# Patient Record
Sex: Female | Born: 1935 | Race: White | Hispanic: No | Smoking: Never smoker
Health system: Southern US, Community
[De-identification: ages and names within clinical notes are randomized; demographics above are authoritative.]

## PROBLEM LIST (undated history)

## (undated) DIAGNOSIS — F419 Anxiety disorder, unspecified: Secondary | ICD-10-CM

## (undated) DIAGNOSIS — K649 Unspecified hemorrhoids: Secondary | ICD-10-CM

## (undated) DIAGNOSIS — E7800 Pure hypercholesterolemia, unspecified: Secondary | ICD-10-CM

## (undated) DIAGNOSIS — K219 Gastro-esophageal reflux disease without esophagitis: Secondary | ICD-10-CM

## (undated) DIAGNOSIS — H409 Unspecified glaucoma: Secondary | ICD-10-CM

## (undated) DIAGNOSIS — G629 Polyneuropathy, unspecified: Secondary | ICD-10-CM

## (undated) DIAGNOSIS — F329 Major depressive disorder, single episode, unspecified: Secondary | ICD-10-CM

## (undated) DIAGNOSIS — R0602 Shortness of breath: Secondary | ICD-10-CM

## (undated) DIAGNOSIS — F0390 Unspecified dementia without behavioral disturbance: Secondary | ICD-10-CM

## (undated) DIAGNOSIS — R627 Adult failure to thrive: Secondary | ICD-10-CM

## (undated) DIAGNOSIS — E785 Hyperlipidemia, unspecified: Secondary | ICD-10-CM

## (undated) DIAGNOSIS — K5900 Constipation, unspecified: Secondary | ICD-10-CM

## (undated) DIAGNOSIS — R630 Anorexia: Secondary | ICD-10-CM

## (undated) DIAGNOSIS — G894 Chronic pain syndrome: Secondary | ICD-10-CM

## (undated) HISTORY — PX: EYE SURGERY: SHX253

## (undated) HISTORY — PX: TONSILLECTOMY: SUR1361

---

## 2000-08-09 ENCOUNTER — Ambulatory Visit (HOSPITAL_COMMUNITY): Admission: RE | Admit: 2000-08-09 | Discharge: 2000-08-09 | Payer: Self-pay | Admitting: Family Medicine

## 2000-08-09 ENCOUNTER — Encounter: Payer: Self-pay | Admitting: Family Medicine

## 2001-01-04 ENCOUNTER — Other Ambulatory Visit: Admission: RE | Admit: 2001-01-04 | Discharge: 2001-01-04 | Payer: Self-pay | Admitting: Family Medicine

## 2001-02-13 ENCOUNTER — Ambulatory Visit (HOSPITAL_COMMUNITY): Admission: RE | Admit: 2001-02-13 | Discharge: 2001-02-13 | Payer: Self-pay | Admitting: Family Medicine

## 2001-02-13 ENCOUNTER — Encounter: Payer: Self-pay | Admitting: Family Medicine

## 2002-02-17 ENCOUNTER — Ambulatory Visit (HOSPITAL_COMMUNITY): Admission: RE | Admit: 2002-02-17 | Discharge: 2002-02-17 | Payer: Self-pay | Admitting: Family Medicine

## 2002-02-17 ENCOUNTER — Encounter: Payer: Self-pay | Admitting: Family Medicine

## 2003-04-17 ENCOUNTER — Inpatient Hospital Stay (HOSPITAL_COMMUNITY): Admission: EM | Admit: 2003-04-17 | Discharge: 2003-04-19 | Payer: Self-pay | Admitting: Emergency Medicine

## 2003-07-26 ENCOUNTER — Emergency Department (HOSPITAL_COMMUNITY): Admission: EM | Admit: 2003-07-26 | Discharge: 2003-07-26 | Payer: Self-pay | Admitting: Emergency Medicine

## 2003-08-12 ENCOUNTER — Ambulatory Visit (HOSPITAL_COMMUNITY): Admission: RE | Admit: 2003-08-12 | Discharge: 2003-08-12 | Payer: Self-pay | Admitting: Family Medicine

## 2003-10-07 ENCOUNTER — Ambulatory Visit (HOSPITAL_COMMUNITY): Admission: RE | Admit: 2003-10-07 | Discharge: 2003-10-07 | Payer: Self-pay | Admitting: Family Medicine

## 2004-05-24 ENCOUNTER — Ambulatory Visit (HOSPITAL_COMMUNITY): Admission: RE | Admit: 2004-05-24 | Discharge: 2004-05-24 | Payer: Self-pay | Admitting: Family Medicine

## 2004-06-29 ENCOUNTER — Emergency Department (HOSPITAL_COMMUNITY): Admission: EM | Admit: 2004-06-29 | Discharge: 2004-06-29 | Payer: Self-pay | Admitting: Emergency Medicine

## 2005-04-16 ENCOUNTER — Emergency Department (HOSPITAL_COMMUNITY): Admission: EM | Admit: 2005-04-16 | Discharge: 2005-04-16 | Payer: Self-pay | Admitting: Emergency Medicine

## 2005-04-21 ENCOUNTER — Ambulatory Visit (HOSPITAL_COMMUNITY): Admission: RE | Admit: 2005-04-21 | Discharge: 2005-04-21 | Payer: Self-pay | Admitting: Family Medicine

## 2005-05-02 ENCOUNTER — Ambulatory Visit (HOSPITAL_COMMUNITY): Admission: RE | Admit: 2005-05-02 | Discharge: 2005-05-02 | Payer: Self-pay | Admitting: Family Medicine

## 2005-06-22 ENCOUNTER — Emergency Department (HOSPITAL_COMMUNITY): Admission: EM | Admit: 2005-06-22 | Discharge: 2005-06-22 | Payer: Self-pay | Admitting: Emergency Medicine

## 2005-06-24 ENCOUNTER — Inpatient Hospital Stay (HOSPITAL_COMMUNITY): Admission: EM | Admit: 2005-06-24 | Discharge: 2005-06-27 | Payer: Self-pay | Admitting: Emergency Medicine

## 2005-06-24 ENCOUNTER — Ambulatory Visit: Payer: Self-pay | Admitting: Internal Medicine

## 2006-03-22 ENCOUNTER — Emergency Department (HOSPITAL_COMMUNITY): Admission: EM | Admit: 2006-03-22 | Discharge: 2006-03-22 | Payer: Self-pay | Admitting: Emergency Medicine

## 2008-05-11 ENCOUNTER — Ambulatory Visit (HOSPITAL_COMMUNITY): Payer: Self-pay | Admitting: Family Medicine

## 2008-05-11 ENCOUNTER — Encounter (HOSPITAL_COMMUNITY): Admission: RE | Admit: 2008-05-11 | Discharge: 2008-06-10 | Payer: Self-pay | Admitting: Oncology

## 2010-05-08 ENCOUNTER — Emergency Department (HOSPITAL_COMMUNITY)
Admission: EM | Admit: 2010-05-08 | Discharge: 2010-05-08 | Payer: Self-pay | Source: Home / Self Care | Admitting: Emergency Medicine

## 2010-05-22 ENCOUNTER — Encounter: Payer: Self-pay | Admitting: Family Medicine

## 2010-09-16 NOTE — Group Therapy Note (Signed)
NAME:  Alexandra Riley, Alexandra Riley                         ACCOUNT NO.:  [id number]   MEDICAL RECORD NO.:  [id number]                   PATIENT TYPE:  INP   LOCATION:  A320                                 FACILITY:  [facility]   PHYSICIAN:  Mila Homer. Sudie Bailey, M.D.           DATE OF BIRTH:  23-Mar-1922   DATE OF PROCEDURE:  04/18/2003  DATE OF DISCHARGE:                                   PROGRESS NOTE   SUBJECTIVE:  She is feeling somewhat better.  She tells me that for 3-4  months she has had intermittent dizziness which gives a sensation of  movement.  It is noted more when she is standing up and moving around but  also noted when she is sitting and turning.  She developed sudden onset of  vomiting two nights ago.  She had no diarrhea with this, but after coming to  the hospital she developed chills.  There were no sweats.  She is now  feeling better and is actually taking a soft mechanical diet.   OBJECTIVE:  She is sitting in bed in no acute distress, well-developed well-  nourished.  She appears to be oriented and alert.  Temperature is 97.1,  pulse 57, respiratory rate 20, blood pressure 139/74.  The lungs are clear  throughout.  The heart has a regular rhythm.  The abdomen today is soft  without hepatosplenomegaly or mass. There is no tenderness in the abdomen.  There is no edema of the ankles.  O2 saturation on room air is 96%.  Skin  color is good.   LABORATORY DATA:  MET-7 showed a potassium of 3.3.   ASSESSMENT:  1. Probably a gastritis.  2. The dizzy spells may be secondary to strokes (see this admission's brain     CT).   PLAN:  Continue with the Plavix 75 mg daily and p.r.n. acetaminophen,  alprazolam, meclizine, on __________, and promethazine.  She is currently on  KCl IV though we will switch it to p.o. and HepLock at this point.      ___________________________________________                                            Mila Homer. Sudie Bailey, M.D.   SDK/MEDQ  D:  04/18/2003   T:  04/18/2003  Job:  [id number]

## 2010-09-16 NOTE — Discharge Summary (Signed)
NAMEGIGI, Alexandra Riley               ACCOUNT NO.:  [id number]   MEDICAL RECORD NO.:  [id number]          PATIENT TYPE:  INP   LOCATION:  3018                         FACILITY:  [facility]   PHYSICIAN:  C. Ulyess Mort, M.D.DATE OF BIRTH:  11-05-1921   DATE OF ADMISSION:  06/24/2005  DATE OF DISCHARGE:  06/27/2005                                 DISCHARGE SUMMARY   DISCHARGE DIAGNOSES:  1.  T4 and T7 vertebral wedge fracture.  2.  Partial small bowel obstruction secondary to obstipation.  3.  Dyslipidemia.  4.  Anxiety.  5.  History of paroxysmal atrial fibrillation.  6.  History of cesarean section.  7.  History of probable cerebrovascular accident.  8.  Cystitis.   DISCHARGE MEDICATIONS:  1.  Percocet one to two tablets p.o. q.4h. p.r.n.  2.  Aspirin 81 mg p.o. daily.  3.  Zetia 10 mg p.o. daily.  4.  Fenofibrate 130 mg p.o. daily.  5.  Clonazepam 0.5 mg p.o. t.i.d.  6.  Senna two tablets p.o. daily.  7.  Cipro one tablet p.o. b.i.d.   CONDITION ON DISCHARGE:  Alexandra Riley was admitted with complaints of  abdominal and back pain.  Following diagnostic imaging Alexandra Riley was  found to have a T4 and T7 vertebral wedge fracture which most likely  explained her back and flank pain.  An acute abdominal series revealed air  fluid levels in her abdomen indicating a partial small bowel obstruction.  This was relieved with an enema and laxatives.  This was likely secondary to  narcotic use to treat her back pain resulting from her wedge fracture.  She  is to follow up with her primary care physician, Dr. Butch Penny on March  6 at 9:15 a.m.   PROCEDURES:  June 26, 2005:  Abdominal CT without contrast/pelvic CT  without contrast.  Impression:  New bibasilar atelectasis.  No acute intra-  abdominal abnormality.  No acute abnormality in the pelvis.   CHIEF COMPLAINT:  Back pain.   HISTORY OF PRESENT ILLNESS:  Alexandra Riley is an 77 year old female with past  medical history of  osteoporosis who presents with an approximately four-day  history of abdominal pain.  Symptoms began four days prior to admission at  which time she was sent to [facility] Emergency Department and was  discharged with a prescription for Vicodin, back exercises.  Pain was  unrelieved and constant so she sought evaluation at [facility].  Pain was  worse with movement, unrelieved with Vicodin.  She admits to a previous fall  recently where she struck the left flank.  She initially notes recurrent  cystitis; however, denies these symptoms recently and additionally notes  recent nausea over the past four days.   ALLERGIES:  No known drug allergies.   PAST MEDICAL HISTORY:  1.  Paroxysmal atrial fibrillation.  2.  Dyslipidemia.  3.  History of a cesarean section.  4.  Probable CVA in 2004.   HOME MEDICATIONS:  1.  Oxycodone/acetaminophen 7.5/325 p.o. q.4h. p.r.n.  2.  Phenergan 50 mg p.o. p.r.n.  3.  Aspirin  81 mg p.o. daily.  4.  Zetia 10 mg p.o. daily.  5.  Fenofibrate 130 mg p.o. daily.  6.  Meclizine p.o. p.r.n.  7.  Clonazepam 0.5 mg p.o. t.i.d.  8.  Soma 350 mg p.o. daily.   REVIEW OF SYSTEMS:  Negative except as per HPI.   PHYSICAL EXAMINATION:  VITAL SIGNS:  Temperature 97.4, blood pressure  112/66, heart rate 69, respirations 20, oxygen saturation 99% on room air.  GENERAL:  No acute distress.  Thin.  HEENT:  Pupils are equal, round, and reactive to light.  Extraocular  movements intact.  Mucous membranes pink and moist.  No tonsillar  lymphadenopathy.  Poor dentition.  Supple neck.  PULMONARY:  Breath sounds clear to auscultation bilaterally.  Equal  excursion bilaterally.  CV:  S1, S2 without murmurs, thrills, rubs, or gallops.  ABDOMEN:  Soft, nontender, nondistended.  Decreased bowel sounds x4.  EXTREMITIES:  No clubbing, cyanosis, edema.  SKIN:  Friable.  MUSCULOSKELETAL:  Patient has kyphosis and tenderness over the right mid  thoracic ribs and left  flank.  NEUROLOGIC:  Alert and oriented x3.  No gross or focal deficits.  PSYCH:  Euthymic with concurrent affect.   LABORATORIES:  White blood cell count 10, hemoglobin 13.1, hematocrit 38.7,  platelets 288.  Sodium 139, potassium 4.5, chloride 108, CO2 25, glucose  110, BUN 8, creatinine 1, total bilirubin 0.6, alkaline phosphatase 65, AST  21, ALT 14, total protein 6.7, albumin 3.8, calcium 9.5, lipase 27.  CK-MB  1.5, troponin less than 0.05, myoglobin 121.  UA within normal limits except  for small leukocyte esterase, 3-6 white blood cells.   HOSPITAL COURSE:  #1 - VERTEBRAL WEDGE FRACTURES:  Alexandra Riley was found to  have a T4 and a T7 wedge fracture on review of CT scan taken at previous  evaluation at [facility] on the 22nd of February.  She was treated with  calcium plus vitamin D during hospital stay and Percocet was given for back  pain.  Patient's osteoporosis is severe and will be managed by the primary  care physician.  She potentially would benefit from continued use of Os-Cal  and potentially bisphosphonates.   #2 - CONSTIPATION:  Alexandra Riley was found on upright abdominal films to  have multiple air fluid levels providing evidence for a partial small bowel  obstruction.  She received enemas and laxatives during her hospital stay  which were very effective and she is to be discharged on Senna following  discharge.   #3 - URINARY TRACT INFECTION:  Alexandra Riley was found to have a Proteus  infection; however, the colony count was 25,000 colonies per milliliter.  This was questionable significance, however, with her abdominal pain.  This  was treated with three-day course of p.o. Cipro.   #4 - DYSLIPIDEMIA:  Alexandra Riley's home medications of Zetia and fenofibrate  were continued through her hospital stay.   #5 - ANXIETY:  Alexandra Riley's home medication of clonazepam was continued  through her hospital stay.  DISCHARGE LABORATORIES AND VITALS:  Temperature 97.9,  heart rate 72,  respirations 20, blood pressure 97/46, oxygen saturation 94% on room air.  Sodium 133, potassium 3.7, chloride 102, bicarbonate 24, glucose 118, BUN  12, creatinine 1, calcium 9.6, magnesium 2.3.  Free T4 1.08.      Sharin Mons, M.D.    ______________________________  C. Ulyess Mort, M.D.    WC/MEDQ  D:  07/03/2005  T:  07/03/2005  Job:  [id number]  cc:   Angus G. Renard Matter, MD  Fax: [phone]

## 2010-09-16 NOTE — Procedures (Signed)
NAME:  Alexandra Riley, Alexandra Riley                         ACCOUNT NO.:  [id number]   MEDICAL RECORD NO.:  [id number]                   PATIENT TYPE:  INP   LOCATION:  A320                                 FACILITY:  [facility]   PHYSICIAN:  Richard A. Alanda Amass, M.D.          DATE OF BIRTH:  1921/07/17   DATE OF PROCEDURE:  04/17/2003  DATE OF DISCHARGE:                                  ECHOCARDIOGRAM   TECHNICAL QUALITY:  Adequate.   INDICATIONS FOR PROCEDURE:  Apparently this 77 year old woman has had a  history of CVA with no prior cardiac history.   FINDINGS:  The aorta is normal at 2.8 cm.   The aortic valve has 3 leaflets with good opening. There is mild thickening  and mild aortic sclerosis. There is no significant aortic stenosis present.  No significant AI present.   The left atrium is normal at 3.5 cm. The patient was in sinus rhythm during  this study. There were no clots seen.   The mitral valve opens normally with mild to moderate mitral annular  calcification. There is trivial MR present and no mitral stenosis.   IVS and LVPW are at the upper limits of normal at 1.1 cm each with normal  contraction pattern.   The left ventricular internal dimensions are relatively small but within  normal limits. LVIDD equal at 3.5, LVISD equal at 2.2 cm. There is normal  thickening of all visualized segments with estimated ejection fraction  greater than 55%. LV inflow signal shows mild E to A reversal, that may be  related to age or may be related to mild diastolic relaxation abnormality.   Pulmonary inflow signal is normal.   Right ventricle is normal. IVC is normal. There is no pericardial effusion.   There is mild tricuspid regurgitation present.   2-D ECHOCARDIOGRAM:  Demonstrates normal left atrial size, normal systolic  LV function, aortic sclerosis without stenosis and mild mitral annular  calcification. There is no source of embolus seen. If further information is  necessary, TEE  may helpful.      ___________________________________________                                            Pearletha Furl. Alanda Amass, M.D.   RAW/MEDQ  D:  04/17/2003  T:  04/18/2003  Job:  [id number]   cc:   Angus G. Renard Matter, M.D.  [address]  [place]  [place] [address]  Fax: [phone]

## 2010-09-16 NOTE — Discharge Summary (Signed)
NAME:  Alexandra Riley, Alexandra Riley                         ACCOUNT NO.:  [id number]   MEDICAL RECORD NO.:  [id number]                  PATIENT TYPE:   LOCATION:                                       FACILITY:  [facility]   PHYSICIAN:  Mila Homer. Sudie Bailey, M.D.           DATE OF BIRTH:  1921/08/09   DATE OF ADMISSION:  04/17/2003  DATE OF DISCHARGE:  04/19/2003                                 DISCHARGE SUMMARY   This 77 year old was admitted to the hospital April 17, 2003.  She had a  benign 3-day hospitalization extending from December 17 to April 19, 2003.  Vital signs remained stable.   Her white cell count was 6900 with a normal differential.  Her potassium is  3.0, glucose is 139, and potassium went up to 3.3 with potassium  supplementation.  Her liver function tests were normal. CK/MB was 4.1 and  troponin less than 0.01.  Her EKG was essentially normal.  Her CT of the  head without contrast showed a possible early subacute stroke of the left  pons.   She was admitted to the hospital, put on IV normal saline at 100 cc an hour,  Phenergan 12.5 mg IV q.4h. p.r.n., clear liquid diet, and vital signs  q.i.d., and Plavix 75 mg daily with Xanax 0.5 q.4h. p.r.n. on 3 liters of  O2.  Carotid Dopplers were ordered.  She was put on Tylenol 650 q.4h. and  then KCl 20 mEq to her IV.  By her second day her IV was Hep-Locked and she  was put on KCl 20 mEq b.i.d.Marland Kitchen   By the third day she was much better.  She was up and around.  She is eating  well.  It was felt that she was stable for discharge home.  In the hospital  she gave a history of having chills after coming in with the vomiting, so it  my feeling that the most likely diagnosis was viral gastroenteritis.  There  was a questionable area on the CT of the brain for stroke.  As I discussed  with her she could certainly have had a silent stroke in the past, or  actually she could have had a stroke 4 months ago when she started having  dizzy  spells.   FINAL DISCHARGE DIAGNOSES:  1. Presumptive viral gastroenteritis.  2. Hypokalemia.  3. Possible cerebrovascular accident.   DISCHARGE INSTRUCTIONS:  The patient discharged home today to follow up with  Dr. Renard Matter this week.   DISCHARGE MEDICATIONS:  1. She is given a prescription for Plavix 75 mg daily (10 with 2 refills).  2. Meclizine 25 mg 1/2 to 1 tablet q.i.d. (30 with 2 refills).  3. Continue clonazepam 0.5 t.i.d.     ___________________________________________  Mila Homer. Sudie Bailey, M.D.   SDK/MEDQ  D:  04/19/2003  T:  04/19/2003  Job:  [id number]   cc:   Angus G. Renard Matter, M.D.  [address]  [place]  [place] [address]  Fax: [phone]

## 2010-09-16 NOTE — H&P (Signed)
NAME:  Alexandra Riley, Alexandra Riley                         ACCOUNT NO.:  [id number]   MEDICAL RECORD NO.:  [id number]                   PATIENT TYPE:  INP   LOCATION:  A320                                 FACILITY:  [facility]   PHYSICIAN:  Angus G. Renard Matter, M.D.              DATE OF BIRTH:  [birth year]   DATE OF ADMISSION:  04/16/2003  DATE OF DISCHARGE:                                HISTORY & PHYSICAL   HISTORY OF PRESENT ILLNESS:  A 77 year old white female presented herself to  the ED with bouts of vomiting and dizziness.  She was evaluated by ED  physician.  CT of the head showed possible subacute lacunar stroke in the  left pons, no hemorrhage.  The patient was given IV Zofran in ED.  Intravenous fluids were started.  The patient's lab studies on admission  showed CBC:  WBC 6900, hemoglobin 13.3, hematocrit 39.8.  Chemistry:  Sodium  142, potassium 3, chloride 110, CO2 25, glucose 139, BUN 12, creatinine 0.8,  calcium 8.9.  Cardiac enzymes:  CPK 143, CPK-MB 4.1, troponin less than  0.01.  The patient was subsequently admitted.   SOCIAL HISTORY:  The patient does not smoke or drink alcohol, lives alone.   FAMILY HISTORY:  See previous record.   PAST MEDICAL AND SURGICAL HISTORY:  The patient has a history of  hyperlipidemia, previous history of C-section.   MEDICATION LIST AT HOME:  1. Clonazepam 0.5 mg t.i.d.  2. Carisoprodol 350 mg four times a day.   ALLERGIES:   REVIEW OF SYSTEMS:  HEENT:  Episodes of dizziness.  GI:  No bowel  irregularity but episodes of vomiting.  CARDIOPULMONARY:  No cough,  hemoptysis, or dyspnea.  GU:  No dysuria or hematuria.   PHYSICAL EXAMINATION:  GENERAL:  Alert female, uncomfortable.  VITAL SIGNS:  Blood pressure 139/60, respirations 18, pulse 62, temperature  98.  HEENT:  Eyes PERRLA, TM negative, oropharynx benign.  NECK:  Supple.  No JVD or thyroid abnormalities.  BREASTS:  Normal, no masses.  HEART:  Regular rhythm.  ABDOMEN:  No palpable organs or  masses.  Decreased bowel sounds.  SKIN:  Warm and dry.  NEUROLOGIC:  No focal deficit.   ASSESSMENT:  The patient was admitted with cerebrovascular accident,  dyslipidemia.     ___________________________________________                                         Ishmael Holter. Renard Matter, M.D.   AGM/MEDQ  D:  04/17/2003  T:  04/17/2003  Job:  [id number]

## 2010-09-20 ENCOUNTER — Other Ambulatory Visit (HOSPITAL_COMMUNITY): Payer: Self-pay | Admitting: Family Medicine

## 2010-09-24 ENCOUNTER — Emergency Department (HOSPITAL_COMMUNITY): Payer: Medicare Other

## 2010-09-24 ENCOUNTER — Emergency Department (HOSPITAL_COMMUNITY)
Admission: EM | Admit: 2010-09-24 | Discharge: 2010-09-24 | Disposition: A | Discharge status: Home / Self Care | Payer: Medicare Other | Attending: Emergency Medicine | Admitting: Emergency Medicine

## 2010-09-24 DIAGNOSIS — I4891 Unspecified atrial fibrillation: Secondary | ICD-10-CM | POA: Insufficient documentation

## 2010-09-24 DIAGNOSIS — M129 Arthropathy, unspecified: Secondary | ICD-10-CM | POA: Insufficient documentation

## 2010-09-24 DIAGNOSIS — Z7982 Long term (current) use of aspirin: Secondary | ICD-10-CM | POA: Insufficient documentation

## 2010-09-24 DIAGNOSIS — M79609 Pain in unspecified limb: Secondary | ICD-10-CM | POA: Insufficient documentation

## 2010-09-24 DIAGNOSIS — G8929 Other chronic pain: Secondary | ICD-10-CM | POA: Insufficient documentation

## 2010-09-24 DIAGNOSIS — S5010XA Contusion of unspecified forearm, initial encounter: Secondary | ICD-10-CM | POA: Insufficient documentation

## 2010-09-24 DIAGNOSIS — Z79899 Other long term (current) drug therapy: Secondary | ICD-10-CM | POA: Insufficient documentation

## 2010-09-24 DIAGNOSIS — Y92009 Unspecified place in unspecified non-institutional (private) residence as the place of occurrence of the external cause: Secondary | ICD-10-CM | POA: Insufficient documentation

## 2010-09-24 DIAGNOSIS — M542 Cervicalgia: Secondary | ICD-10-CM | POA: Insufficient documentation

## 2010-09-24 DIAGNOSIS — M810 Age-related osteoporosis without current pathological fracture: Secondary | ICD-10-CM | POA: Insufficient documentation

## 2010-09-24 DIAGNOSIS — M7989 Other specified soft tissue disorders: Secondary | ICD-10-CM | POA: Insufficient documentation

## 2010-09-24 DIAGNOSIS — E785 Hyperlipidemia, unspecified: Secondary | ICD-10-CM | POA: Insufficient documentation

## 2010-09-27 ENCOUNTER — Other Ambulatory Visit (HOSPITAL_COMMUNITY): Payer: Self-pay

## 2011-03-12 ENCOUNTER — Encounter: Payer: Self-pay | Admitting: Emergency Medicine

## 2011-03-12 ENCOUNTER — Emergency Department (HOSPITAL_COMMUNITY)
Admission: EM | Admit: 2011-03-12 | Discharge: 2011-03-12 | Disposition: A | Discharge status: Home / Self Care | Payer: Medicare Other | Attending: Emergency Medicine | Admitting: Emergency Medicine

## 2011-03-12 ENCOUNTER — Emergency Department (HOSPITAL_COMMUNITY): Payer: Medicare Other

## 2011-03-12 DIAGNOSIS — R109 Unspecified abdominal pain: Secondary | ICD-10-CM | POA: Insufficient documentation

## 2011-03-12 DIAGNOSIS — N390 Urinary tract infection, site not specified: Secondary | ICD-10-CM | POA: Insufficient documentation

## 2011-03-12 DIAGNOSIS — R062 Wheezing: Secondary | ICD-10-CM | POA: Insufficient documentation

## 2011-03-12 DIAGNOSIS — E7800 Pure hypercholesterolemia, unspecified: Secondary | ICD-10-CM | POA: Insufficient documentation

## 2011-03-12 DIAGNOSIS — Z7982 Long term (current) use of aspirin: Secondary | ICD-10-CM | POA: Insufficient documentation

## 2011-03-12 DIAGNOSIS — M810 Age-related osteoporosis without current pathological fracture: Secondary | ICD-10-CM | POA: Insufficient documentation

## 2011-03-12 HISTORY — DX: Pure hypercholesterolemia, unspecified: E78.00

## 2011-03-12 LAB — COMPREHENSIVE METABOLIC PANEL
ALT: 13 U/L (ref 0–35)
Albumin: 4.3 g/dL (ref 3.5–5.2)
BUN: 14 mg/dL (ref 6–23)
Calcium: 10.1 mg/dL (ref 8.4–10.5)
GFR calc Af Amer: 66 mL/min — ABNORMAL LOW (ref >90)
Glucose, Bld: 92 mg/dL (ref 70–99)
Potassium: 3.9 mEq/L (ref 3.5–5.1)
Sodium: 142 mEq/L (ref 135–145)
Total Protein: 7.4 g/dL (ref 6.0–8.3)

## 2011-03-12 LAB — URINE MICROSCOPIC-ADD ON

## 2011-03-12 LAB — URINALYSIS, ROUTINE W REFLEX MICROSCOPIC
Bilirubin Urine: NEGATIVE
Ketones, ur: NEGATIVE mg/dL
Nitrite: POSITIVE — AB
Protein, ur: NEGATIVE mg/dL
Urobilinogen, UA: 0.2 mg/dL (ref 0.0–1.0)
pH: 5 (ref 5.0–8.0)

## 2011-03-12 LAB — CBC
Hemoglobin: 12.3 g/dL (ref 12.0–15.0)
MCH: 30.3 pg (ref 26.0–34.0)
MCHC: 31.9 g/dL (ref 30.0–36.0)
RDW: 14.3 % (ref 11.5–15.5)

## 2011-03-12 LAB — LIPASE, BLOOD: Lipase: 76 U/L — ABNORMAL HIGH (ref 11–59)

## 2011-03-12 MED ORDER — CEPHALEXIN 500 MG PO CAPS: 500.0000 mg | ORAL_CAPSULE | Freq: Four times a day (QID) | ORAL | Status: AC

## 2011-03-12 MED ORDER — DEXTROSE 5 % IV SOLN
1.0000 g | Freq: Once | INTRAVENOUS | Status: AC
  Administered 2011-03-12: 1 g via INTRAVENOUS
  Filled 2011-03-12: qty 10

## 2011-03-12 MED ORDER — IOHEXOL 300 MG/ML  SOLN
100.0000 mL | Freq: Once | INTRAMUSCULAR | Status: AC | PRN
  Administered 2011-03-12: 100 mL via INTRAVENOUS

## 2011-03-12 NOTE — ED Provider Notes (Signed)
Scribed for Alexandra Chick, MD, the patient was seen in room APA12/APA12. This chart was scribed by [employer]. The patient's care started at 10:40  CSN: [id number] Arrival date & time: 03/12/2011 10:19 AM   First MD Initiated Contact with Patient 03/12/11 1040      Chief Complaint  Patient presents with  . Abdominal Pain  . Dysuria   HPI Alexandra Riley is a 77 y.o. female who presents to the Emergency Department complaining of Abdominal pain and dysuria. She reports having increased urine frequency. States that it sounds like she can "hear water gushing around my stomach". Per family, patient has been complaining of upper abdominal pain for weeks. States that she complained of lower abdominal pain a couple of days. Lower abdominal pain is worsening. Reports relief with hydrocodone.  Denies fever, diarrhea or vomiting. Last bowel movement was yesterday. She is currently unaware of blood in stool.  Pain is described as sharp and burning with urination.  Symptoms are moderate in intensity.  No other associated symptoms.  Cannot say what makes pain better or worse.   Past Medical History  Diagnosis Date  . Osteoporosis   . High cholesterol     Past Surgical History  Procedure Date  . Tonsillectomy   . Cesarean section     No family history on file.  History  Substance Use Topics  . Smoking status: Never Smoker   . Smokeless tobacco: Current User  . Alcohol Use: No    OB History    Grav Para Term Preterm Abortions TAB SAB Ect Mult Living                  Review of Systems  Constitutional: Negative.  Negative for fever and unexpected weight change.  HENT: Negative.  Negative for ear pain and sore throat.   Eyes: Negative for visual disturbance.  Respiratory: Positive for wheezing. Negative for cough.   Cardiovascular: Negative.  Negative for chest pain.  Gastrointestinal: Positive for abdominal pain. Negative for vomiting and diarrhea.  Genitourinary: Positive for  dysuria.  Musculoskeletal: Negative.  Negative for myalgias.  Skin: Negative.  Negative for rash.  Neurological: Negative.  Negative for dizziness, seizures and syncope.  Hematological: Negative.   Psychiatric/Behavioral: Negative.   All other systems reviewed and are negative.    Allergies  Review of patient's allergies indicates no known allergies.  Home Medications   Current Outpatient Rx  Name Route Sig Dispense Refill  . ASPIRIN 81 MG PO CHEW Oral Chew 81 mg by mouth daily.      . ATORVASTATIN CALCIUM 10 MG PO TABS Oral Take 10 mg by mouth every morning.     Marland Kitchen CARISOPRODOL 350 MG PO TABS Oral Take 350 mg by mouth 4 (four) times daily as needed. For sleep and muscle relaxant    . FENOFIBRATE 160 MG PO TABS Oral Take 160 mg by mouth at bedtime.     Marland Kitchen HYDROCODONE-ACETAMINOPHEN 7.5-750 MG PO TABS Oral Take 1 tablet by mouth every 6 (six) hours as needed.      . CENTRUM PO CHEW Oral Chew 1 tablet by mouth daily.        BP 152/81  Pulse 65  Temp 98.2 F (36.8 C)  Resp 20  Ht 5\' 5"  (1.651 m)  Wt 138 lb (62.596 kg)  BMI 22.96 kg/m2  SpO2 100%  Physical Exam  Nursing note and vitals reviewed. Constitutional: She is oriented to person, place, and time. She appears well-developed  and well-nourished. No distress.  HENT:  Head: Normocephalic and atraumatic.  Right Ear: External ear normal.  Left Ear: External ear normal.  Eyes: Conjunctivae are normal. Right eye exhibits no discharge. Left eye exhibits no discharge. No scleral icterus.  Neck: Neck supple. No tracheal deviation present.  Cardiovascular: Normal rate, regular rhythm and intact distal pulses.   No murmur heard. Pulmonary/Chest: Effort normal and breath sounds normal. No stridor. No respiratory distress. She has no wheezes. She has no rales.  Abdominal: Soft. Bowel sounds are normal. She exhibits no distension and no mass. There is tenderness (mild left lower quadrant tenderness to palpation). There is no rebound  and no guarding.  Musculoskeletal: She exhibits no edema and no tenderness.  Neurological: She is alert and oriented to person, place, and time. She has normal strength. No sensory deficit. Cranial nerve deficit:  no gross defecits noted. She exhibits normal muscle tone. She displays no seizure activity. Coordination normal.  Skin: Skin is warm and dry. No rash noted. She is not diaphoretic.  Psychiatric: She has a normal mood and affect. Her behavior is normal.     ED Course  Procedures  DIAGNOSTIC STUDIES: Oxygen Saturation is 100% on room air, normal by my interpretation.    COORDINATION OF CARE: 11:05 - EDP examined patient at bedside. UA, blood work and CT scan ordered. Orders Placed This Encounter  Procedures  . Urinalysis with microscopic     Results for orders placed during the hospital encounter of 03/12/11  URINALYSIS, ROUTINE W REFLEX MICROSCOPIC      Component Value Range   Color, Urine ORANGE (*) YELLOW    Appearance HAZY (*) CLEAR    Specific Gravity, Urine 1.015  1.005 - 1.030    pH 5.0  5.0 - 8.0    Glucose, UA NEGATIVE  NEGATIVE (mg/dL)   Hgb urine dipstick MODERATE (*) NEGATIVE    Bilirubin Urine NEGATIVE  NEGATIVE    Ketones, ur NEGATIVE  NEGATIVE (mg/dL)   Protein, ur NEGATIVE  NEGATIVE (mg/dL)   Urobilinogen, UA 0.2  0.0 - 1.0 (mg/dL)   Nitrite POSITIVE (*) NEGATIVE    Leukocytes, UA SMALL (*) NEGATIVE   CBC      Component Value Range   WBC 6.3  4.0 - 10.5 (K/uL)   RBC 4.06  3.87 - 5.11 (MIL/uL)   Hemoglobin 12.3  12.0 - 15.0 (g/dL)   HCT 16.1  09.6 - 04.5 (%)   MCV 94.8  78.0 - 100.0 (fL)   MCH 30.3  26.0 - 34.0 (pg)   MCHC 31.9  30.0 - 36.0 (g/dL)   RDW 40.9  81.1 - 91.4 (%)   Platelets 226  150 - 400 (K/uL)  COMPREHENSIVE METABOLIC PANEL      Component Value Range   Sodium 142  135 - 145 (mEq/L)   Potassium 3.9  3.5 - 5.1 (mEq/L)   Chloride 109  96 - 112 (mEq/L)   CO2 27  19 - 32 (mEq/L)   Glucose, Bld 92  70 - 99 (mg/dL)   BUN 14  6 - 23  (mg/dL)   Creatinine, Ser 7.82  0.50 - 1.10 (mg/dL)   Calcium 95.6  8.4 - 10.5 (mg/dL)   Total Protein 7.4  6.0 - 8.3 (g/dL)   Albumin 4.3  3.5 - 5.2 (g/dL)   AST 21  0 - 37 (U/L)   ALT 13  0 - 35 (U/L)   Alkaline Phosphatase 37 (*) 39 - 117 (U/L)  Total Bilirubin 0.3  0.3 - 1.2 (mg/dL)   GFR calc non Af Amer 57 (*) >90 (mL/min)   GFR calc Af Amer 66 (*) >90 (mL/min)  LIPASE, BLOOD      Component Value Range   Lipase 76 (*) 11 - 59 (U/L)  URINE MICROSCOPIC-ADD ON      Component Value Range   Squamous Epithelial / LPF FEW (*) RARE    WBC, UA 21-50  <3 (WBC/hpf)   RBC / HPF 11-20  <3 (RBC/hpf)   Bacteria, UA MANY (*) RARE    Ct Abdomen Pelvis W Contrast  03/12/2011  *RADIOLOGY REPORT*  Clinical Data: No abdominal pain, nausea  CT ABDOMEN AND PELVIS WITH CONTRAST  Technique:  Multidetector CT imaging of the abdomen and pelvis was performed following the standard protocol during bolus administration of intravenous contrast.  Contrast: OMNIPAQUE IOHEXOL 300 MG/ML IV SOLN  Comparison: CT abdomen pelvis - 06/26/2005  Findings:  Normal hepatic contour.  No discrete hepatic lesions.  Normal gallbladder.  No intra or extra panic biliary ductal dilatation.  There is symmetric enhancement initiation of the bilateral kidneys. No urinary obstruction.  The bilateral adrenal glands, pancreas and spleen are normal.  Small hiatal hernia.  Ingested enteric contrast extends to the level of the descending colon.  No evidence of enteric obstruction. Colonic diverticulosis without evidence of diverticulitis.  Gaseous distension of the rectum.  Scattered atherosclerotic calcifications of a normal caliber but mildly tortuous abdominal aorta.  The major branch vessels of the abdominal aorta, including the IMA, are patent.  Incidental note is made of retrograde filling of the left gonadal vein, however there is no pelvic varicosities.  No retroperitoneal, mesenteric, pelvic or inguinal lymphadenopathy. Post  hysterectomy.  No free fluid within the pelvis.  Limited visualization of the lower thorax demonstrates bibasilar atelectasis.  Normal heart size.  No pericardial effusion. Mitral valvular calcifications.  No acute or aggressive osseous abnormalities.  The lumbar spine degenerative change.  Unchanged mild anterior compression deformity of the T11 vertebral body.  IMPRESSION: 1.  No explanation for patient's left sided abdominal pain. Specifically, no evidence of enteric urinary obstruction. 2.  Colonic diverticulosis without evidence of diverticulitis.  Original Report Authenticated By: Waynard Reeds, M.D.     MDM:Pt with dysuria and urinary frequency with intermittent lower abdominal pain.  Urine shows c/w UTI.  Labs reassuring.  Abdominal CT without significant abnormality.  Pt started on rocephin in ED for UTI.  Will discharge on keflex.  Pt nontoxic and well appearing in ED.  Discharged with stirct return precautions.  Pt agreeable with plan.,    Scribe Attestation: I personally performed the services described in this documentation, which was scribed in my presence. The recorded information has been reviewed and considered.     Alexandra Chick, MD 03/12/11 1323

## 2011-03-12 NOTE — ED Notes (Signed)
Pt c/o lower abd pain for aprox 4 days. Pt c/o painful and difficult urination. Pt denies n/v/d.

## 2011-03-12 NOTE — ED Notes (Signed)
Pt c/o lower abd pain with difficulty and painful urination

## 2011-04-03 ENCOUNTER — Encounter (HOSPITAL_COMMUNITY): Payer: Self-pay | Admitting: Pharmacy Technician

## 2011-04-05 ENCOUNTER — Encounter (HOSPITAL_COMMUNITY)
Admission: RE | Admit: 2011-04-05 | Discharge: 2011-04-05 | Payer: Medicare Other | Source: Ambulatory Visit | Attending: Ophthalmology | Admitting: Ophthalmology

## 2011-04-05 NOTE — Patient Instructions (Signed)
20 Alexandra Riley  04/05/2011   Your procedure is scheduled on:  04/10/2011  Report to Alexandra Riley at 12:0 PM.  Call this number if you have problems the morning of surgery: [phone]   Remember:   Do not eat food:After Midnight.  May have clear liquids:until Midnight .  Clear liquids include soda, tea, black coffee, apple or grape juice, broth.  Take these medicines the morning of surgery with A SIP OF WATER: Carisoprolol, Klonopin, Vicodin, Meclizine, and Phenergan.   Do not wear jewelry, make-up or nail polish.  Do not wear lotions, powders, or perfumes. You may wear deodorant.  Do not shave 48 hours prior to surgery.  Do not bring valuables to the hospital.  Contacts, dentures or bridgework may not be worn into surgery.  Leave suitcase in the car. After surgery it may be brought to your room.  For patients admitted to the hospital, checkout time is 11:00 AM the day of discharge.   Patients discharged the day of surgery will not be allowed to drive home.  Name and phone number of your driver:   Special Instructions: N/A   Please read over the following fact sheets that you were given: Anesthesia Post-op Instructions     Cataract A cataract is a clouding of the lens of the eye. It is most often related to aging. A cataract is not a "film" over the surface of the eye. The lens is inside the eye and changes size of the pupil. The lens can enlarge to let more light enter the eye in dark environments and contract the size of the pupil to let in bright light. The lens is the part of the eye that helps focus light on the retina. The retina is the eye's light-sensitive layer. It is in the back of the eye that sends visual signals to the brain. In a normal eye, light passes through the lens and gets focused on the retina. To help produce a sharp image, the lens must remain clear. When a lens becomes cloudy, vision is compromised by the degree and nature of the clouding. Certain cataracts  make people more near-sighted as they develop, others increase glare, and all reduce vision to some degree or another. A cataract that is so dense that it becomes milky Alexandra Riley and a Alexandra Riley opacity can be seen through the pupil. When the Alexandra Riley color is seen, it is called a "mature" or "hyper-mature cataract." Such cataracts cause total blindness in the affected eye. The cataract must be removed to prevent damage to the eye itself. Some types of cataracts can cause a secondary disease of the eye, such as certain types of glaucoma. In the early stages, better lighting and eyeglasses may lessen vision problems caused by cataracts. At a certain point, surgery may be needed to improve vision. CAUSES   Aging. However, cataracts may occur at any age, even in newborns.   Certain drugs.   Trauma to the eye.   Certain diseases (such as diabetes).   Inherited or acquired medical syndromes.  SYMPTOMS   Gradual, progressive drop in vision in the affected eye. Cataracts may develop at different rates in each eye. Cataracts may even be in just one eye with the other unaffected.   Cataracts due to trauma may develop quickly, sometimes over a matter or days or even hours. The result is severe and rapid visual loss.  DIAGNOSIS  To detect a cataract, an eye doctor examines the lens. A well developed cataract can  be diagnosed without dilating the pupil. Early cataracts and others of a specific nature are best diagnosed with an exam of the eyes with the pupils dilated by drops. TREATMENT   For an early cataract, vision may improve by using different eyeglasses or stronger lighting.   If the above measures do not help, surgery is the only effective treatment. This treatment removes the cloudy lens and replaces it with a substitute lens (Intraocular lens, or IOL). Newly developed IOL technology allows the implanted lens to improve vision both at a distance and up close. Discuss with your eye surgeon about the  possibility of still needing glasses. Also discuss how visual coordination between both eyes will be affected.  A cataract needs to be removed only when vision loss interferes with your everyday activities such as driving, reading or watching TV. You and your eye doctor can make that decision together. In most cases, waiting until you are ready to have cataract surgery will not harm your eye. If you have cataracts in both eyes, only one should be removed at a time. This allows the operated eye to heal and be out of danger from serious problems (such as infection or poor wound healing) before having the other eye undergo surgery.  Sometimes, a cataract should be removed even if it does not cause problems with your vision. For example, a cataract should be removed if it prevents examination or treatment of another eye problem. Just as you cannot see out of the affected eye well, your doctor cannot see into your eye well through a cataract. The vast majority of people who have cataract surgery have better vision afterward. CATARACT REMOVAL There are two primary ways to remove a cataract. Your doctor can explain the differences and help determine which is best for you:  Phacoemulsification (small incision cataract surgery). This involves making a small cut (incision) on the edge of the clear, dome-shaped surface that covers the front of the eye (the cornea). An injection behind the eye or eye drops are given to make this a painless procedure. The doctor then inserts a tiny probe into the eye. This device emits ultrasound waves that soften and break up the cloudy center of the lens so it can be removed by suction. Most cataract surgery is done this way. The cuts are usually so small and performed in such a manner that often no sutures are needed to keep it closed.   Extracapsular surgery. Your doctor makes a slightly longer incision on the side of the cornea. The doctor removes the hard center of the lens. The  remainder of the lens is then removed by suction. In some cases, extremely fine sutures are needed which the doctor may, or may not remove in the office after the surgery.  When an IOL is implanted, it needs no care. It becomes a permanent part of your eye and cannot be seen or felt.  Some people cannot have an IOL. They may have problems during surgery, or maybe they have another eye disease. For these people, a soft contact lens may be suggested. If an IOL or contact lens cannot be used, very powerful and thick glasses are required after surgery. Since vision is very different through such thick glasses, it is important to have your doctor discuss the impact on your vision after any cataract surgery where there is no plan to implant an IOL. The normal lens of the eye is covered by a clear capsule. Both phacoemulsification and extracapsular surgery require  that the back surface of this lens capsule be left in place. This helps support IOLs and prevents the IOL from dislocating and falling back into the deeper interior of the eye. Right after surgery, and often permanently this "posterior capsule" remains clear. In some cases however, it can become cloudy, presenting the same type of visual compromise that the original cataract did since light is again obstructed as it passes through the clear IOL. This condition is often referred to as an "after-cataract." Fortunately, after-cataracts are easily treated using a painless and very fast laser treatment that is performed without anesthesia or incisions. It is done in a matter of minutes in an outpatient environment. Visual improvement is often immediate.  HOME CARE INSTRUCTIONS   Your surgeon will discuss pre and post operative care with you prior to surgery. The majority of people are able to do almost all normal activities right away. Although, it is often advised to avoid strenuous activity for a period of time.   Postoperative drops and careful avoidance of  infection will be needed. Many surgeons suggest the use of a protective shield during the first few days after surgery.   There is a very small incidence of complication from modern cataract surgery, but it can happen. Infection that spreads to the inside of the eye (endophthalmitis) can result in total visual loss and even loss of the eye itself. In extremely rare instances, the inflammation of endophthalmitis can spread to both eyes (sympathetic ophthalmia). Appropriate post-operative care under the close observation of your surgeon is essential to a successful outcome.  SEEK IMMEDIATE MEDICAL CARE IF:   You have any sudden drop of vision in the operated eye.   You have pain in the operated eye.   You see a large number of floating dots in the field of vision in the operated eye.   You see flashing lights, or if a portion of your side vision in any direction appears black (like a curtain being drawn into your field of vision) in the operated eye.  Document Released: 04/17/2005 Document Revised: 12/28/2010 Document Reviewed: 06/03/2007 [facility] Patient Information 2012 [place], [place].    PATIENT INSTRUCTIONS POST-ANESTHESIA  IMMEDIATELY FOLLOWING SURGERY:  Do not drive or operate machinery for the first twenty four hours after surgery.  Do not make any important decisions for twenty four hours after surgery or while taking narcotic pain medications or sedatives.  If you develop intractable nausea and vomiting or a severe headache please notify your doctor immediately.  FOLLOW-UP:  Please make an appointment with your surgeon as instructed. You do not need to follow up with anesthesia unless specifically instructed to do so.  WOUND CARE INSTRUCTIONS (if applicable):  Keep a dry clean dressing on the anesthesia/puncture wound site if there is drainage.  Once the wound has quit draining you may leave it open to air.  Generally you should leave the bandage intact for twenty four hours unless  there is drainage.  If the epidural site drains for more than 36-48 hours please call the anesthesia department.  QUESTIONS?:  Please feel free to call your physician or the hospital operator if you have any questions, and they will be happy to assist you.     [facility] Anesthesia Department [address] [place] [phone]

## 2011-04-10 ENCOUNTER — Ambulatory Visit (HOSPITAL_COMMUNITY): Admission: RE | Admit: 2011-04-10 | Payer: Medicare Other | Source: Ambulatory Visit | Admitting: Ophthalmology

## 2011-04-10 ENCOUNTER — Encounter (HOSPITAL_COMMUNITY): Admission: RE | Payer: Self-pay | Source: Ambulatory Visit

## 2011-04-10 SURGERY — PHACOEMULSIFICATION, CATARACT, WITH IOL INSERTION
Anesthesia: Monitor Anesthesia Care | Site: Eye | Laterality: Right

## 2011-09-14 ENCOUNTER — Ambulatory Visit (HOSPITAL_COMMUNITY)
Admission: RE | Admit: 2011-09-14 | Discharge: 2011-09-14 | Disposition: A | Discharge status: Home / Self Care | Payer: Medicare Other | Source: Ambulatory Visit | Attending: Family Medicine | Admitting: Family Medicine

## 2011-09-14 ENCOUNTER — Other Ambulatory Visit (HOSPITAL_COMMUNITY): Payer: Medicare Other

## 2011-09-14 DIAGNOSIS — M810 Age-related osteoporosis without current pathological fracture: Secondary | ICD-10-CM | POA: Insufficient documentation

## 2011-09-14 DIAGNOSIS — Z780 Asymptomatic menopausal state: Secondary | ICD-10-CM | POA: Insufficient documentation

## 2011-10-16 ENCOUNTER — Other Ambulatory Visit (HOSPITAL_COMMUNITY): Payer: Medicare Other

## 2011-12-28 ENCOUNTER — Encounter (HOSPITAL_COMMUNITY): Payer: Self-pay | Admitting: Emergency Medicine

## 2011-12-28 ENCOUNTER — Emergency Department (HOSPITAL_COMMUNITY): Payer: Medicare Other

## 2011-12-28 ENCOUNTER — Inpatient Hospital Stay (HOSPITAL_COMMUNITY)
Admission: EM | Admit: 2011-12-28 | Discharge: 2012-01-03 | DRG: 395 | Disposition: A | Discharge status: Home / Self Care | Payer: Medicare Other | Attending: General Surgery | Admitting: General Surgery

## 2011-12-28 DIAGNOSIS — E7800 Pure hypercholesterolemia, unspecified: Secondary | ICD-10-CM | POA: Diagnosis present

## 2011-12-28 DIAGNOSIS — Z79899 Other long term (current) drug therapy: Secondary | ICD-10-CM

## 2011-12-28 DIAGNOSIS — K559 Vascular disorder of intestine, unspecified: Principal | ICD-10-CM | POA: Diagnosis present

## 2011-12-28 DIAGNOSIS — K5900 Constipation, unspecified: Secondary | ICD-10-CM | POA: Diagnosis present

## 2011-12-28 DIAGNOSIS — K644 Residual hemorrhoidal skin tags: Secondary | ICD-10-CM | POA: Diagnosis present

## 2011-12-28 DIAGNOSIS — E876 Hypokalemia: Secondary | ICD-10-CM | POA: Diagnosis present

## 2011-12-28 DIAGNOSIS — Z7982 Long term (current) use of aspirin: Secondary | ICD-10-CM

## 2011-12-28 DIAGNOSIS — M810 Age-related osteoporosis without current pathological fracture: Secondary | ICD-10-CM | POA: Diagnosis present

## 2011-12-28 HISTORY — DX: Constipation, unspecified: K59.00

## 2011-12-28 HISTORY — DX: Unspecified glaucoma: H40.9

## 2011-12-28 LAB — URINALYSIS, ROUTINE W REFLEX MICROSCOPIC
Glucose, UA: NEGATIVE mg/dL
Ketones, ur: NEGATIVE mg/dL
Leukocytes, UA: NEGATIVE
pH: 6 (ref 5.0–8.0)

## 2011-12-28 LAB — COMPREHENSIVE METABOLIC PANEL
ALT: 25 U/L (ref 0–35)
AST: 23 U/L (ref 0–37)
Alkaline Phosphatase: 77 U/L (ref 39–117)
CO2: 24 mEq/L (ref 19–32)
Calcium: 10.6 mg/dL — ABNORMAL HIGH (ref 8.4–10.5)
Chloride: 101 mEq/L (ref 96–112)
GFR calc Af Amer: 88 mL/min — ABNORMAL LOW (ref >90)
GFR calc non Af Amer: 76 mL/min — ABNORMAL LOW (ref >90)
Glucose, Bld: 192 mg/dL — ABNORMAL HIGH (ref 70–99)
Potassium: 3.4 mEq/L — ABNORMAL LOW (ref 3.5–5.1)
Sodium: 139 mEq/L (ref 135–145)

## 2011-12-28 LAB — CBC WITH DIFFERENTIAL/PLATELET
Basophils Absolute: 0 10*3/uL (ref 0.0–0.1)
Eosinophils Relative: 0 % (ref 0–5)
Lymphocytes Relative: 6 % — ABNORMAL LOW (ref 12–46)
Lymphs Abs: 0.9 10*3/uL (ref 0.7–4.0)
Neutro Abs: 13.8 10*3/uL — ABNORMAL HIGH (ref 1.7–7.7)
Neutrophils Relative %: 87 % — ABNORMAL HIGH (ref 43–77)
Platelets: 175 10*3/uL (ref 150–400)
RBC: 4.97 MIL/uL (ref 3.87–5.11)
RDW: 13.7 % (ref 11.5–15.5)
WBC: 15.9 10*3/uL — ABNORMAL HIGH (ref 4.0–10.5)

## 2011-12-28 LAB — URINE MICROSCOPIC-ADD ON

## 2011-12-28 MED ORDER — LACTATED RINGERS IV SOLN
INTRAVENOUS | Status: DC
  Administered 2011-12-28 – 2011-12-29 (×2): via INTRAVENOUS
  Administered 2011-12-29: 1000 mL via INTRAVENOUS
  Administered 2011-12-30: 09:00:00 via INTRAVENOUS
  Administered 2011-12-30: 1000 mL via INTRAVENOUS

## 2011-12-28 MED ORDER — METRONIDAZOLE IN NACL 5-0.79 MG/ML-% IV SOLN
500.0000 mg | Freq: Three times a day (TID) | INTRAVENOUS | Status: DC
  Administered 2011-12-28 – 2012-01-03 (×17): 500 mg via INTRAVENOUS
  Filled 2011-12-28 (×20): qty 100

## 2011-12-28 MED ORDER — ONDANSETRON HCL 4 MG/2ML IJ SOLN
4.0000 mg | Freq: Once | INTRAMUSCULAR | Status: AC
  Administered 2011-12-28: 4 mg via INTRAVENOUS
  Filled 2011-12-28: qty 2

## 2011-12-28 MED ORDER — CIPROFLOXACIN IN D5W 400 MG/200ML IV SOLN
400.0000 mg | Freq: Once | INTRAVENOUS | Status: AC
  Administered 2011-12-28: 400 mg via INTRAVENOUS
  Filled 2011-12-28: qty 200

## 2011-12-28 MED ORDER — ENOXAPARIN SODIUM 40 MG/0.4ML ~~LOC~~ SOLN
40.0000 mg | SUBCUTANEOUS | Status: DC
  Administered 2011-12-28 – 2012-01-02 (×6): 40 mg via SUBCUTANEOUS
  Filled 2011-12-28 (×6): qty 0.4

## 2011-12-28 MED ORDER — SODIUM CHLORIDE 0.9 % IV SOLN
Freq: Once | INTRAVENOUS | Status: AC
  Administered 2011-12-28: 16:00:00 via INTRAVENOUS

## 2011-12-28 MED ORDER — CIPROFLOXACIN IN D5W 400 MG/200ML IV SOLN
400.0000 mg | Freq: Two times a day (BID) | INTRAVENOUS | Status: DC
  Administered 2011-12-29 – 2012-01-03 (×11): 400 mg via INTRAVENOUS
  Filled 2011-12-28 (×13): qty 200

## 2011-12-28 MED ORDER — IOHEXOL 300 MG/ML  SOLN
100.0000 mL | Freq: Once | INTRAMUSCULAR | Status: AC | PRN
  Administered 2011-12-28: 100 mL via INTRAVENOUS

## 2011-12-28 MED ORDER — PANTOPRAZOLE SODIUM 40 MG IV SOLR
40.0000 mg | Freq: Every day | INTRAVENOUS | Status: DC
  Administered 2011-12-28 – 2011-12-31 (×4): 40 mg via INTRAVENOUS
  Filled 2011-12-28 (×4): qty 40

## 2011-12-28 MED ORDER — METRONIDAZOLE IN NACL 5-0.79 MG/ML-% IV SOLN
500.0000 mg | Freq: Once | INTRAVENOUS | Status: AC
  Administered 2011-12-28: 500 mg via INTRAVENOUS
  Filled 2011-12-28: qty 100

## 2011-12-28 MED ORDER — FENTANYL CITRATE 0.05 MG/ML IJ SOLN
50.0000 ug | Freq: Once | INTRAMUSCULAR | Status: AC
  Administered 2011-12-28: 50 ug via INTRAVENOUS
  Filled 2011-12-28: qty 2

## 2011-12-28 MED ORDER — FENTANYL CITRATE 0.05 MG/ML IJ SOLN
100.0000 ug | Freq: Once | INTRAMUSCULAR | Status: AC
  Administered 2011-12-28: 100 ug via INTRAVENOUS
  Filled 2011-12-28: qty 2

## 2011-12-28 MED ORDER — ONDANSETRON HCL 4 MG/2ML IJ SOLN
4.0000 mg | Freq: Four times a day (QID) | INTRAMUSCULAR | Status: DC | PRN
  Administered 2011-12-28 – 2012-01-03 (×14): 4 mg via INTRAVENOUS
  Filled 2011-12-28 (×15): qty 2

## 2011-12-28 MED ORDER — HYDROMORPHONE HCL PF 1 MG/ML IJ SOLN
0.5000 mg | INTRAMUSCULAR | Status: DC | PRN
  Administered 2011-12-28 – 2012-01-02 (×18): 1 mg via INTRAVENOUS
  Administered 2012-01-03: 0.5 mg via INTRAVENOUS
  Filled 2011-12-28 (×6): qty 1
  Filled 2011-12-28: qty 6
  Filled 2011-12-28: qty 12
  Filled 2011-12-28 (×2): qty 1
  Filled 2011-12-28: qty 6
  Filled 2011-12-28 (×2): qty 1
  Filled 2011-12-28: qty 6
  Filled 2011-12-28 (×4): qty 1
  Filled 2011-12-28 (×3): qty 6

## 2011-12-28 MED ORDER — SODIUM CHLORIDE 0.9 % IV BOLUS (SEPSIS)
1000.0000 mL | Freq: Once | INTRAVENOUS | Status: AC
  Administered 2011-12-28: 1000 mL via INTRAVENOUS

## 2011-12-28 NOTE — ED Notes (Addendum)
Patient transported to CT. Pt stable and ready for transport. 

## 2011-12-28 NOTE — ED Provider Notes (Signed)
History   This chart was scribed for Glynn Octave, MD by Charolett Bumpers . The patient was seen in room APA04/APA04. Patient's care was started at 1157.   CSN: [id number]  Arrival date & time 12/28/11  1141   First MD Initiated Contact with Patient 12/28/11 1157      Chief Complaint  Patient presents with  . Abdominal Pain    (Consider location/radiation/quality/duration/timing/severity/associated sxs/prior treatment) HPI Alexandra Riley is a 78 y.o. female who presents to the Emergency Department complaining of constant, moderate, generalized abdominal pain over the past few weeks that has worsened over the past 2 days. Family reports associated vomiting, constipation, and decreased appetite. Family reports vomit appears dark and stool-like. Pt denies any blood in stool. Last BM was 2 days ago. Family denies any h/o similar abdominal pain. Pt has a h/o constipation problems. Family reports that the pt has been drinking prune juice, taking stool softeners and an enema with no relief. Only abdominal surgery reported is a c-section.  PCP: Dr. Renard Matter GI: Dr. Karilyn Cota  Past Medical History  Diagnosis Date  . Osteoporosis   . High cholesterol     Past Surgical History  Procedure Date  . Tonsillectomy   . Cesarean section     History reviewed. No pertinent family history.  History  Substance Use Topics  . Smoking status: Never Smoker   . Smokeless tobacco: Current User  . Alcohol Use: No    OB History    Grav Para Term Preterm Abortions TAB SAB Ect Mult Living                  Review of Systems A complete 10 system review of systems was obtained and all systems are negative except as noted in the HPI and PMH.   Allergies  Review of patient's allergies indicates no known allergies.  Home Medications   Current Outpatient Rx  Name Route Sig Dispense Refill  . ASPIRIN 81 MG PO CHEW Oral Chew 81 mg by mouth daily.      . ATORVASTATIN CALCIUM 10 MG PO TABS  Oral Take 10 mg by mouth every morning.     Marland Kitchen CARISOPRODOL 350 MG PO TABS Oral Take 350 mg by mouth 4 (four) times daily as needed. For sleep and muscle relaxant    . CLONAZEPAM 0.5 MG PO TABS Oral Take 0.5 mg by mouth 3 (three) times daily as needed. For anxiety     . FENOFIBRATE 160 MG PO TABS Oral Take 160 mg by mouth at bedtime.     Marland Kitchen HYDROCODONE-ACETAMINOPHEN 7.5-750 MG PO TABS Oral Take 1 tablet by mouth every 4 (four) hours as needed.     Marland Kitchen MECLIZINE HCL 25 MG PO TABS Oral Take 25 mg by mouth 3 (three) times daily as needed. For nausea     . ANALGESIC BALM 15-15 % EX OINT Topical Apply 1 application topically 2 (two) times daily as needed. For muscle pain      . CENTRUM PO CHEW Oral Chew 1 tablet by mouth daily.      Marland Kitchen PROMETHAZINE HCL 25 MG PO TABS Oral Take 25 mg by mouth every 6 (six) hours as needed. For nausea       BP 161/65  Pulse 93  Temp 98.2 F (36.8 C) (Oral)  Resp 16  Ht 5\' 5"  (1.651 m)  Wt 138 lb (62.596 kg)  BMI 22.96 kg/m2  SpO2 100%  Physical Exam  Nursing note and vitals reviewed.  Constitutional: She is oriented to person, place, and time. She appears well-developed and well-nourished. No distress.  HENT:  Head: Normocephalic and atraumatic.  Right Ear: External ear normal.  Left Ear: External ear normal.  Nose: Nose normal.  Eyes: EOM are normal. Pupils are equal, round, and reactive to light.  Neck: Normal range of motion. Neck supple. No tracheal deviation present.  Cardiovascular: Normal rate, regular rhythm, normal heart sounds and intact distal pulses.        Equal DP and TP pulses.   Pulmonary/Chest: Effort normal and breath sounds normal. No respiratory distress. She has no wheezes.  Abdominal: Soft. She exhibits distension. Bowel sounds are decreased. There is tenderness.       Diffuse abdominal tenderness. Bowel sounds diminished.   Genitourinary: Rectal exam shows external hemorrhoid. Guaiac negative stool.       Multiple external hemorrhoids.  No fecal impaction.   Musculoskeletal: Normal range of motion. She exhibits no edema.  Neurological: She is alert and oriented to person, place, and time. No sensory deficit.  Skin: Skin is warm and dry.  Psychiatric: She has a normal mood and affect. Her behavior is normal.    ED Course  Procedures (including critical care time)  DIAGNOSTIC STUDIES: Oxygen Saturation is 100% on room air, normal by my interpretation.    COORDINATION OF CARE:  12:09-Preformed rectal exam with chaperon present.   12:10-Discussed planned course of treatment with the patient, who is agreeable at this time.   12:15-Medication Orders: Ondansetron (Zofran) injection 4 mg-once; Sodium chloride 0.9% bolus 1,000 mL-once  14:20-Recheck: Informed pt of imaging and lab results. Will consult general surgery.   14:29: Consultation with Dr. Leticia Penna, general surgery. Discussed pt's case. Dr. Leticia Penna to see pt in ED.   14:30-Medication Orders: Ciprofloxacin (Cipro) IVPB 400 mg-once; Metronidazole (Flagl) IVPB 500 mg-once.   14:38-Discussed findings with family with pt's consent. Dr. Leticia Penna to see pt in ED.   Results for orders placed during the hospital encounter of 12/28/11  OCCULT BLOOD, POC DEVICE      Component Value Range   Fecal Occult Bld NEGATIVE    CBC WITH DIFFERENTIAL      Component Value Range   WBC 15.9 (*) 4.0 - 10.5 K/uL   RBC 4.97  3.87 - 5.11 MIL/uL   Hemoglobin 14.7  12.0 - 15.0 g/dL   HCT 16.1  09.6 - 04.5 %   MCV 89.3  78.0 - 100.0 fL   MCH 29.6  26.0 - 34.0 pg   MCHC 33.1  30.0 - 36.0 g/dL   RDW 40.9  81.1 - 91.4 %   Platelets 175  150 - 400 K/uL   Neutrophils Relative 87 (*) 43 - 77 %   Neutro Abs 13.8 (*) 1.7 - 7.7 K/uL   Lymphocytes Relative 6 (*) 12 - 46 %   Lymphs Abs 0.9  0.7 - 4.0 K/uL   Monocytes Relative 7  3 - 12 %   Monocytes Absolute 1.1 (*) 0.1 - 1.0 K/uL   Eosinophils Relative 0  0 - 5 %   Eosinophils Absolute 0.0  0.0 - 0.7 K/uL   Basophils Relative 0  0 - 1 %    Basophils Absolute 0.0  0.0 - 0.1 K/uL  COMPREHENSIVE METABOLIC PANEL      Component Value Range   Sodium 139  135 - 145 mEq/L   Potassium 3.4 (*) 3.5 - 5.1 mEq/L   Chloride 101  96 - 112 mEq/L   CO2  24  19 - 32 mEq/L   Glucose, Bld 192 (*) 70 - 99 mg/dL   BUN 19  6 - 23 mg/dL   Creatinine, Ser 8.29  0.50 - 1.10 mg/dL   Calcium 56.2 (*) 8.4 - 10.5 mg/dL   Total Protein 7.2  6.0 - 8.3 g/dL   Albumin 4.3  3.5 - 5.2 g/dL   AST 23  0 - 37 U/L   ALT 25  0 - 35 U/L   Alkaline Phosphatase 77  39 - 117 U/L   Total Bilirubin 0.6  0.3 - 1.2 mg/dL   GFR calc non Af Amer 76 (*) >90 mL/min   GFR calc Af Amer 88 (*) >90 mL/min  LIPASE, BLOOD      Component Value Range   Lipase 41  11 - 59 U/L  LACTIC ACID, PLASMA      Component Value Range   Lactic Acid, Venous 3.1 (*) 0.5 - 2.2 mmol/L    Ct Abdomen Pelvis W Contrast  12/28/2011  *RADIOLOGY REPORT*  Clinical Data: Abdominal pain with nausea, vomiting and constipation for 2 days.  CT ABDOMEN AND PELVIS WITH CONTRAST  Technique:  Multidetector CT imaging of the abdomen and pelvis was performed following the standard protocol during bolus administration of intravenous contrast.  Contrast: OMNIPAQUE IOHEXOL 300 MG/ML  SOLN  Comparison: Abdominal pelvic CT 03/12/2011.  Findings: Mild atelectasis is present at both lung bases.  There is no pleural effusion.  The liver, gallbladder, biliary system and pancreas appear normal. The spleen and adrenal glands appear normal. The kidneys are stable with a small cyst in the lower pole of the left kidney.  There is no hydronephrosis.  There is a small hiatal hernia.  The stomach and small bowel otherwise appear normal.  There is fluid throughout the colon which is mildly distended.  There is mild colonic wall thickening and surrounding soft tissue stranding at the splenic flexure and extending into the proximal descending colon.  Sigmoid colon diverticular changes are present without surrounding inflammatory  change.  There is no extraluminal fluid collection or evidence of bowel obstruction.  There is diffuse atherosclerosis of the aorta, iliac and proximal visceral arteries.  The patient appears to have replaced common hepatic artery.  No large vessel occlusion is identified.  The inferior mesenteric artery appears patent.  The portal and superior mesenteric veins are patent.  The uterus, adnexa and bladder appear unremarkable.  There is no lymphadenopathy.  There are degenerative changes throughout the spine associated with a mild convex right scoliosis.  No acute osseous findings are seen.  IMPRESSION:  1.  Focal colitis at the splenic flexure is nonspecific, although concerning for possible ischemic bowel based on the distribution. There is diffuse atherosclerosis without evidence of large vessel occlusion. 2.  No evidence of bowel obstruction or perforation. 3.  No significant solid parenchymal organ findings.  Critical Value/emergent results were called by telephone at the time of interpretation on 12/28/2011 at 1420 hours to Dr. Manus Gunning, who verbally acknowledged these results.   Original Report Authenticated By: Gerrianne Scale, M.D.    Dg Abd Acute W/chest  12/28/2011  *RADIOLOGY REPORT*  Clinical Data: Abdominal pain.  ACUTE ABDOMEN SERIES (ABDOMEN 2 VIEW & CHEST 1 VIEW)  Comparison: CT abdomen and pelvis 03/12/2011, 06/26/2005.  Acute abdomen series 06/24/2005.  Findings: Bowel gas pattern unremarkable without evidence of obstruction or significant ileus.  No evidence of free air or significant air fluid levels on the erect image.  Oral contrast material throughout the bowel which was administered for the subsequent CT.  Numerous phleboliths in the pelvis.  No visible opaque urinary tract calculi.  Degenerative changes involving the lumbar spine with thoracolumbar scoliosis convex right.  Suboptimal inspiration accounts for crowded bronchovascular markings, especially in the lung bases, and accentuates  the cardiac silhouette.  Taking this into account, cardiac silhouette normal in size.   Lungs clear.  Bronchovascular markings normal.  Pulmonary vascularity normal.  No pneumothorax.  No pleural effusions.  IMPRESSION: No acute abdominal or pulmonary abnormality.   Original Report Authenticated By: Arnell Sieving, M.D.      No diagnosis found.    MDM  Abdominal distention, abdominal pain, nausea vomiting constipation.  Vitals stable.  Abdomen distended and diffusely tender.  FOBT negative. Labs and imaging obtained.  Leukocytosis with lactic acidosis.  Concern for mesenteric ischemia, colitis, perforation, diverticulitis.  Likely ischemic colitis on CT d./w Dr. Purcell Mouton. Patient given cipro, flagyl. D/w Dr. Leticia Penna who will see patient in ED and admit.  Patient and family updated. Vitals remain stable.  CRITICAL CARE Performed by: Glynn Octave   Total critical care time: 30  Critical care time was exclusive of separately billable procedures and treating other patients.  Critical care was necessary to treat or prevent imminent or life-threatening deterioration.  Critical care was time spent personally by me on the following activities: development of treatment plan with patient and/or surrogate as well as nursing, discussions with consultants, evaluation of patient's response to treatment, examination of patient, obtaining history from patient or surrogate, ordering and performing treatments and interventions, ordering and review of laboratory studies, ordering and review of radiographic studies, pulse oximetry and re-evaluation of patient's condition.     I personally performed the services described in this documentation, which was scribed in my presence.  The recorded information has been reviewed and considered.      Glynn Octave, MD 12/28/11 [phone]

## 2011-12-28 NOTE — ED Notes (Signed)
General Surgery at bedside.

## 2011-12-28 NOTE — ED Notes (Signed)
Pt c/o abd pain and states she has not had a good bm. Pt states she took exlax and been drinking prune juice.

## 2011-12-28 NOTE — H&P (Signed)
Alexandra Riley is an 78 y.o. female.   Chief Complaint: Abdominal pain HPI: Patient presented to [facility] with approximately one week history of persistent left upper quadrant abdominal pain. Pain started relatively insidiously. It is been constant. Occasionally colicky symptomatology. She has had some distention. She has had associated nausea and nonbloody emesis. No exacerbating or relieving features. She denies having any bowel movement over the last several days. She states she's felt constipated. She has attempted both vocal state as well as fleets enema. She's not had much results from either of those. She denies any recent change in bowel movements. No melena or hematochezia. No change with urination. She denies any fevers or chills. No chest pain no shortness of breath. No similar symptomatology in the past. No significant cardiac history although patient's family states she had had some episodes of TIAs in the past. No formal diagnoses of CVA has been noted.  Past Medical History  Diagnosis Date  . Osteoporosis   . High cholesterol     Past Surgical History  Procedure Date  . Tonsillectomy   . Cesarean section     History reviewed. No pertinent family history. Social History:  reports that she has never smoked. She uses smokeless tobacco. She reports that she does not drink alcohol or use illicit drugs.  Allergies: No Known Allergies   (Not in a hospital admission)  Results for orders placed during the hospital encounter of 12/28/11 (from the past 48 hour(s))  OCCULT BLOOD, POC DEVICE     Status: Normal   Collection Time   12/28/11 12:11 PM      Component Value Range Comment   Fecal Occult Bld NEGATIVE     CBC WITH DIFFERENTIAL     Status: Abnormal   Collection Time   12/28/11 12:20 PM      Component Value Range Comment   WBC 15.9 (*) 4.0 - 10.5 K/uL    RBC 4.97  3.87 - 5.11 MIL/uL    Hemoglobin 14.7  12.0 - 15.0 g/dL    HCT 16.1  09.6 - 04.5 %    MCV 89.3   78.0 - 100.0 fL    MCH 29.6  26.0 - 34.0 pg    MCHC 33.1  30.0 - 36.0 g/dL    RDW 40.9  81.1 - 91.4 %    Platelets 175  150 - 400 K/uL    Neutrophils Relative 87 (*) 43 - 77 %    Neutro Abs 13.8 (*) 1.7 - 7.7 K/uL    Lymphocytes Relative 6 (*) 12 - 46 %    Lymphs Abs 0.9  0.7 - 4.0 K/uL    Monocytes Relative 7  3 - 12 %    Monocytes Absolute 1.1 (*) 0.1 - 1.0 K/uL    Eosinophils Relative 0  0 - 5 %    Eosinophils Absolute 0.0  0.0 - 0.7 K/uL    Basophils Relative 0  0 - 1 %    Basophils Absolute 0.0  0.0 - 0.1 K/uL   COMPREHENSIVE METABOLIC PANEL     Status: Abnormal   Collection Time   12/28/11 12:20 PM      Component Value Range Comment   Sodium 139  135 - 145 mEq/L    Potassium 3.4 (*) 3.5 - 5.1 mEq/L    Chloride 101  96 - 112 mEq/L    CO2 24  19 - 32 mEq/L    Glucose, Bld 192 (*) 70 - 99 mg/dL  BUN 19  6 - 23 mg/dL    Creatinine, Ser 1.61  0.50 - 1.10 mg/dL    Calcium 09.6 (*) 8.4 - 10.5 mg/dL    Total Protein 7.2  6.0 - 8.3 g/dL    Albumin 4.3  3.5 - 5.2 g/dL    AST 23  0 - 37 U/L    ALT 25  0 - 35 U/L    Alkaline Phosphatase 77  39 - 117 U/L    Total Bilirubin 0.6  0.3 - 1.2 mg/dL    GFR calc non Af Amer 76 (*) >90 mL/min    GFR calc Af Amer 88 (*) >90 mL/min   LIPASE, BLOOD     Status: Normal   Collection Time   12/28/11 12:20 PM      Component Value Range Comment   Lipase 41  11 - 59 U/L   LACTIC ACID, PLASMA     Status: Abnormal   Collection Time   12/28/11 12:20 PM      Component Value Range Comment   Lactic Acid, Venous 3.1 (*) 0.5 - 2.2 mmol/L    Ct Abdomen Pelvis W Contrast  12/28/2011  *RADIOLOGY REPORT*  Clinical Data: Abdominal pain with nausea, vomiting and constipation for 2 days.  CT ABDOMEN AND PELVIS WITH CONTRAST  Technique:  Multidetector CT imaging of the abdomen and pelvis was performed following the standard protocol during bolus administration of intravenous contrast.  Contrast: OMNIPAQUE IOHEXOL 300 MG/ML  SOLN  Comparison: Abdominal  pelvic CT 03/12/2011.  Findings: Mild atelectasis is present at both lung bases.  There is no pleural effusion.  The liver, gallbladder, biliary system and pancreas appear normal. The spleen and adrenal glands appear normal. The kidneys are stable with a small cyst in the lower pole of the left kidney.  There is no hydronephrosis.  There is a small hiatal hernia.  The stomach and small bowel otherwise appear normal.  There is fluid throughout the colon which is mildly distended.  There is mild colonic wall thickening and surrounding soft tissue stranding at the splenic flexure and extending into the proximal descending colon.  Sigmoid colon diverticular changes are present without surrounding inflammatory change.  There is no extraluminal fluid collection or evidence of bowel obstruction.  There is diffuse atherosclerosis of the aorta, iliac and proximal visceral arteries.  The patient appears to have replaced common hepatic artery.  No large vessel occlusion is identified.  The inferior mesenteric artery appears patent.  The portal and superior mesenteric veins are patent.  The uterus, adnexa and bladder appear unremarkable.  There is no lymphadenopathy.  There are degenerative changes throughout the spine associated with a mild convex right scoliosis.  No acute osseous findings are seen.  IMPRESSION:  1.  Focal colitis at the splenic flexure is nonspecific, although concerning for possible ischemic bowel based on the distribution. There is diffuse atherosclerosis without evidence of large vessel occlusion. 2.  No evidence of bowel obstruction or perforation. 3.  No significant solid parenchymal organ findings.  Critical Value/emergent results were called by telephone at the time of interpretation on 12/28/2011 at 1420 hours to Dr. Manus Gunning, who verbally acknowledged these results.   Original Report Authenticated By: Gerrianne Scale, M.D.    Dg Abd Acute W/chest  12/28/2011  *RADIOLOGY REPORT*  Clinical Data:  Abdominal pain.  ACUTE ABDOMEN SERIES (ABDOMEN 2 VIEW & CHEST 1 VIEW)  Comparison: CT abdomen and pelvis 03/12/2011, 06/26/2005.  Acute abdomen series 06/24/2005.  Findings: Bowel gas pattern unremarkable without evidence of obstruction or significant ileus.  No evidence of free air or significant air fluid levels on the erect image.  Oral contrast material throughout the bowel which was administered for the subsequent CT.  Numerous phleboliths in the pelvis.  No visible opaque urinary tract calculi.  Degenerative changes involving the lumbar spine with thoracolumbar scoliosis convex right.  Suboptimal inspiration accounts for crowded bronchovascular markings, especially in the lung bases, and accentuates the cardiac silhouette.  Taking this into account, cardiac silhouette normal in size.   Lungs clear.  Bronchovascular markings normal.  Pulmonary vascularity normal.  No pneumothorax.  No pleural effusions.  IMPRESSION: No acute abdominal or pulmonary abnormality.   Original Report Authenticated By: Arnell Sieving, M.D.     Review of Systems  Constitutional: Positive for malaise/fatigue. Negative for fever, chills, weight loss and diaphoresis.  HENT: Negative.   Eyes: Negative.   Respiratory: Negative.   Cardiovascular: Negative.   Gastrointestinal: Positive for heartburn, nausea, vomiting, abdominal pain (diffuse) and constipation. Negative for diarrhea, blood in stool and melena.  Genitourinary: Negative.   Musculoskeletal: Negative.   Skin: Negative.   Neurological: Positive for weakness.  Endo/Heme/Allergies: Negative.   Psychiatric/Behavioral: Negative.     Blood pressure 153/66, pulse 97, temperature 98.2 F (36.8 C), temperature source Oral, resp. rate 27, height 5\' 5"  (1.651 m), weight 62.596 kg (138 lb), SpO2 95.00%. Physical Exam  Constitutional: She is oriented to person, place, and time. She appears well-developed. No distress.       Elderly, thin  HENT:  Head: Normocephalic  and atraumatic.  Eyes: Conjunctivae and EOM are normal. Pupils are equal, round, and reactive to light. No scleral icterus.  Neck: Normal range of motion. Neck supple. No tracheal deviation present. No thyromegaly present.  Cardiovascular: Normal rate, regular rhythm and normal heart sounds.   Respiratory: Effort normal and breath sounds normal. No respiratory distress. She has no wheezes.  GI: Soft. She exhibits distension (moderate). She exhibits no mass. There is tenderness (mostly left upper quadrant. No referred pain. No diffuse peritoneal signs.). There is no rebound and no guarding.  Lymphadenopathy:    She has no cervical adenopathy.  Neurological: She is alert and oriented to person, place, and time.  Skin: Skin is warm and dry.     Assessment/Plan Ischemic colitis. Based on patient's exam and CT workup I am suspicious of a ischemic process in the left upper quadrant at the splenic flexure. There is no evidence of perforation. She does not have currently in acute abdomen.  At this point she will be admitted for close monitoring. She'll be admitted to the step down unit. Should be continued on IV fluid hydration. Continued on bowel rest. Continued on Cipro and Flagyl. Close monitoring her very symptomatology will be undertaken. Should she have increase in her symptomatology or should her clinical picture worsen I have discussed at length with the patient and family proceeding to the operating room. Surgical risks have been discussed in March she is not a high risk for surgical intervention, given her age she does have substantial risk should we proceed to the operating room. Patient states understanding of our discussion and plans of action. Patient's family is comfortable with current management plans.  Yasser Hepp C 12/28/2011, 4:20 PM

## 2011-12-28 NOTE — ED Notes (Signed)
Pt reports she hasn't had a BM since the day before yesterday. States it was a small hard stool. Family states she has been having this problem for several weeks. Came today because of severe abdominal pain. She has been having abdominal pain for a few weeks but it is worse today. Pts family reports she has been drinking prune juice, taking stool softeners, and an enema with no result. States that she has been vomiting at about 4:30.

## 2011-12-29 LAB — CBC
MCHC: 32.9 g/dL (ref 30.0–36.0)
Platelets: 168 10*3/uL (ref 150–400)
RDW: 13.9 % (ref 11.5–15.5)
WBC: 15.6 10*3/uL — ABNORMAL HIGH (ref 4.0–10.5)

## 2011-12-29 LAB — COMPREHENSIVE METABOLIC PANEL
ALT: 22 U/L (ref 0–35)
Alkaline Phosphatase: 56 U/L (ref 39–117)
BUN: 25 mg/dL — ABNORMAL HIGH (ref 6–23)
CO2: 24 mEq/L (ref 19–32)
Chloride: 102 mEq/L (ref 96–112)
GFR calc Af Amer: 87 mL/min — ABNORMAL LOW (ref >90)
GFR calc non Af Amer: 75 mL/min — ABNORMAL LOW (ref >90)
Glucose, Bld: 125 mg/dL — ABNORMAL HIGH (ref 70–99)
Potassium: 3 mEq/L — ABNORMAL LOW (ref 3.5–5.1)
Sodium: 136 mEq/L (ref 135–145)
Total Bilirubin: 0.5 mg/dL (ref 0.3–1.2)

## 2011-12-29 MED ORDER — PHENOL 1.4 % MT LIQD
1.0000 | OROMUCOSAL | Status: DC | PRN
  Filled 2011-12-29: qty 177

## 2011-12-29 NOTE — Care Management Note (Signed)
    Page 1 of 2   01/03/2012     12:12:38 PM   CARE MANAGEMENT NOTE 01/03/2012  Patient:  ORIT, SANVILLE   Account Number:  [id number]  Date Initiated:  12/29/2011  Documentation initiated by:  Sharrie Rothman  Subjective/Objective Assessment:   Pt admitted from home with abd pain. Pt lives alone and has a daughter and son who check on her frequently. Pt wants to return home at discharge.     Action/Plan:   CM will continue to follow for CM/HH needs. Pt may require surgery while inpatient.   Anticipated DC Date:  01/02/2012   Anticipated DC Plan:  HOME/SELF CARE      DC Planning Services  CM consult      [facility] Choice  HOME HEALTH   Choice offered to / List presented to:  C-4 Adult Children        HH arranged  HH-2 PT  HH-4 NURSE'S AIDE      HH agency  [facility]   Status of service:  Completed, signed off Medicare Important Message given?   (If response is "NO", the following Medicare IM given date fields will be blank) Date Medicare IM given:   Date Additional Medicare IM given:    Discharge Disposition:  HOME W HOME HEALTH SERVICES  Per UR Regulation:    If discussed at Long Length of Stay Meetings, dates discussed:    Comments:  01/03/12 1209 Arlyss Queen, RN BSN CM Pt discharged home today with [facility] PT and aide. CM spoke with pts daughter, Manson Allan, over the phone about potential discharge and HH options. Pts daugther is agreeable to [facility] and requested a private duty agency list so that they could arrange sitters for in home. Pts son came to pick pt up and he choose [facility] for [facility] and the list was given to pts son. Alroy Bailiff of [facility] is aware and will collect the pts information from the chart. Pts nurse is aware of discharge arrangements.  12/29/11 1512 Arlyss Queen, RN BSN CM

## 2011-12-29 NOTE — Progress Notes (Signed)
UR Chart Review Completed  

## 2011-12-29 NOTE — Progress Notes (Signed)
Subjective: States abdominal pain improved.  Still present but better.  No nausea.  No flatus.  Objective: Vital signs in last 24 hours: Temp:  [97.3 F (36.3 C)-98.8 F (37.1 C)] 98 F (36.7 C) (08/30 1136) Pulse Rate:  [81-105] 86  (08/30 1500) Resp:  [15-31] 20  (08/30 1500) BP: (78-158)/(35-83) 100/62 mmHg (08/30 1200) SpO2:  [89 %-97 %] 95 % (08/30 1500) Weight:  [67.6 kg (149 lb 0.5 oz)-70.6 kg (155 lb 10.3 oz)] 70.6 kg (155 lb 10.3 oz) (08/30 0500) Last BM Date: 12/26/11  Intake/Output from previous day: 08/29 0701 - 08/30 0700 In: 1800 [I.V.:1400; IV Piggyback:400] Out: 450 [Urine:450] Intake/Output this shift:    General appearance: alert and no distress Resp: clear to auscultation bilaterally Cardio: regular rate and rhythm GI: Quiet, soft, mild distention.  Moderate Left upper quadrant tenderness.  No peritoneal signs.    Lab Results:   Basename 12/29/11 0415 12/28/11 1220  WBC 15.6* 15.9*  HGB 14.0 14.7  HCT 42.5 44.4  PLT 168 175   BMET  Basename 12/29/11 0415 12/28/11 1220  NA 136 139  K 3.0* 3.4*  CL 102 101  CO2 24 24  GLUCOSE 125* 192*  BUN 25* 19  CREATININE 0.71 0.69  CALCIUM 8.6 10.6*   PT/INR No results found for this basename: LABPROT:2,INR:2 in the last 72 hours ABG No results found for this basename: PHART:2,PCO2:2,PO2:2,HCO3:2 in the last 72 hours  Studies/Results: Ct Abdomen Pelvis W Contrast  12/28/2011  *RADIOLOGY REPORT*  Clinical Data: Abdominal pain with nausea, vomiting and constipation for 2 days.  CT ABDOMEN AND PELVIS WITH CONTRAST  Technique:  Multidetector CT imaging of the abdomen and pelvis was performed following the standard protocol during bolus administration of intravenous contrast.  Contrast: OMNIPAQUE IOHEXOL 300 MG/ML  SOLN  Comparison: Abdominal pelvic CT 03/12/2011.  Findings: Mild atelectasis is present at both lung bases.  There is no pleural effusion.  The liver, gallbladder, biliary system and  pancreas appear normal. The spleen and adrenal glands appear normal. The kidneys are stable with a small cyst in the lower pole of the left kidney.  There is no hydronephrosis.  There is a small hiatal hernia.  The stomach and small bowel otherwise appear normal.  There is fluid throughout the colon which is mildly distended.  There is mild colonic wall thickening and surrounding soft tissue stranding at the splenic flexure and extending into the proximal descending colon.  Sigmoid colon diverticular changes are present without surrounding inflammatory change.  There is no extraluminal fluid collection or evidence of bowel obstruction.  There is diffuse atherosclerosis of the aorta, iliac and proximal visceral arteries.  The patient appears to have replaced common hepatic artery.  No large vessel occlusion is identified.  The inferior mesenteric artery appears patent.  The portal and superior mesenteric veins are patent.  The uterus, adnexa and bladder appear unremarkable.  There is no lymphadenopathy.  There are degenerative changes throughout the spine associated with a mild convex right scoliosis.  No acute osseous findings are seen.  IMPRESSION:  1.  Focal colitis at the splenic flexure is nonspecific, although concerning for possible ischemic bowel based on the distribution. There is diffuse atherosclerosis without evidence of large vessel occlusion. 2.  No evidence of bowel obstruction or perforation. 3.  No significant solid parenchymal organ findings.  Critical Value/emergent results were called by telephone at the time of interpretation on 12/28/2011 at 1420 hours to Dr. Manus Gunning, who verbally acknowledged these  results.   Original Report Authenticated By: Gerrianne Scale, M.D.    Dg Abd Acute W/chest  12/28/2011  *RADIOLOGY REPORT*  Clinical Data: Abdominal pain.  ACUTE ABDOMEN SERIES (ABDOMEN 2 VIEW & CHEST 1 VIEW)  Comparison: CT abdomen and pelvis 03/12/2011, 06/26/2005.  Acute abdomen series  06/24/2005.  Findings: Bowel gas pattern unremarkable without evidence of obstruction or significant ileus.  No evidence of free air or significant air fluid levels on the erect image.  Oral contrast material throughout the bowel which was administered for the subsequent CT.  Numerous phleboliths in the pelvis.  No visible opaque urinary tract calculi.  Degenerative changes involving the lumbar spine with thoracolumbar scoliosis convex right.  Suboptimal inspiration accounts for crowded bronchovascular markings, especially in the lung bases, and accentuates the cardiac silhouette.  Taking this into account, cardiac silhouette normal in size.   Lungs clear.  Bronchovascular markings normal.  Pulmonary vascularity normal.  No pneumothorax.  No pleural effusions.  IMPRESSION: No acute abdominal or pulmonary abnormality.   Original Report Authenticated By: Arnell Sieving, M.D.     Anti-infectives: Anti-infectives     Start     Dose/Rate Route Frequency Ordered Stop   12/28/11 1615   ciprofloxacin (CIPRO) IVPB 400 mg        400 mg 200 mL/hr over 60 Minutes Intravenous Every 12 hours 12/28/11 1604     12/28/11 1615   metroNIDAZOLE (FLAGYL) IVPB 500 mg        500 mg 100 mL/hr over 60 Minutes Intravenous Every 8 hours 12/28/11 1604     12/28/11 1430   metroNIDAZOLE (FLAGYL) IVPB 500 mg        500 mg 100 mL/hr over 60 Minutes Intravenous  Once 12/28/11 1421 12/28/11 1708   12/28/11 1430   ciprofloxacin (CIPRO) IVPB 400 mg        400 mg 200 mL/hr over 60 Minutes Intravenous  Once 12/28/11 1421 12/28/11 1538          Assessment/Plan: s/p * No surgery found * Ischemic colitis.  Continue Iv fluid, IV abx.  Close monitoring.  no acute surgical indications.  Patient has demonstrated some improvement but not out of possible surgical intervention.  Will continue to monitor very closely for progression.  Discussed with patient and later with son and both express understanding.     LOS: 1 day     Jenan Ellegood C 12/29/2011

## 2011-12-30 LAB — CBC
HCT: 37.6 % (ref 36.0–46.0)
MCH: 29.4 pg (ref 26.0–34.0)
MCV: 90 fL (ref 78.0–100.0)
Platelets: 151 10*3/uL (ref 150–400)
RDW: 14 % (ref 11.5–15.5)
WBC: 10.5 10*3/uL (ref 4.0–10.5)

## 2011-12-30 LAB — COMPREHENSIVE METABOLIC PANEL
AST: 14 U/L (ref 0–37)
BUN: 15 mg/dL (ref 6–23)
CO2: 26 mEq/L (ref 19–32)
Calcium: 8.5 mg/dL (ref 8.4–10.5)
Chloride: 102 mEq/L (ref 96–112)
Creatinine, Ser: 0.77 mg/dL (ref 0.50–1.10)
GFR calc non Af Amer: 73 mL/min — ABNORMAL LOW (ref >90)
Total Bilirubin: 0.4 mg/dL (ref 0.3–1.2)

## 2011-12-30 MED ORDER — POTASSIUM CHLORIDE CRYS ER 20 MEQ PO TBCR
40.0000 meq | EXTENDED_RELEASE_TABLET | Freq: Once | ORAL | Status: AC
  Administered 2011-12-30: 40 meq via ORAL
  Filled 2011-12-30: qty 2

## 2011-12-30 MED ORDER — SODIUM CHLORIDE 0.9 % IJ SOLN
INTRAMUSCULAR | Status: AC
  Administered 2011-12-30: 10 mL
  Filled 2011-12-30: qty 3

## 2011-12-30 MED ORDER — ACETAMINOPHEN 325 MG PO TABS
650.0000 mg | ORAL_TABLET | Freq: Four times a day (QID) | ORAL | Status: DC | PRN
  Administered 2011-12-30 – 2012-01-03 (×3): 650 mg via ORAL
  Filled 2011-12-30 (×3): qty 2

## 2011-12-30 NOTE — Progress Notes (Signed)
Subjective: Pain improved. No nausea.  +BM.  No fever or chills.  Objective: Vital signs in last 24 hours: Temp:  [97.8 F (36.6 C)-98.5 F (36.9 C)] 98.2 F (36.8 C) (08/31 0700) Pulse Rate:  [79-101] 91  (08/31 1137) Resp:  [15-24] 19  (08/31 1200) BP: (59-129)/(31-67) 113/67 mmHg (08/31 1000) SpO2:  [92 %-98 %] 97 % (08/31 1137) Weight:  [66.8 kg (147 lb 4.3 oz)] 66.8 kg (147 lb 4.3 oz) (08/31 0500) Last BM Date: 12/30/11  Intake/Output from previous day: 08/30 0701 - 08/31 0700 In: 3193.3 [P.O.:180; I.V.:2303.3; IV Piggyback:710] Out: 650 [Urine:550; Emesis/NG output:100] Intake/Output this shift: Total I/O In: 100 [I.V.:100] Out: -   General appearance: alert and no distress Resp: clear to auscultation bilaterally Cardio: regular rate and rhythm GI: +BS, soft, moderate LUQ tenderness.  No peritoneal signs.  No masses.  Lab Results:   Basename 12/30/11 0930 12/29/11 0415  WBC 10.5 15.6*  HGB 12.3 14.0  HCT 37.6 42.5  PLT 151 168   BMET  Basename 12/30/11 0930 12/29/11 0415  NA 137 136  K 2.8* 3.0*  CL 102 102  CO2 26 24  GLUCOSE 105* 125*  BUN 15 25*  CREATININE 0.77 0.71  CALCIUM 8.5 8.6   PT/INR No results found for this basename: LABPROT:2,INR:2 in the last 72 hours ABG No results found for this basename: PHART:2,PCO2:2,PO2:2,HCO3:2 in the last 72 hours  Studies/Results: Ct Abdomen Pelvis W Contrast  12/28/2011  *RADIOLOGY REPORT*  Clinical Data: Abdominal pain with nausea, vomiting and constipation for 2 days.  CT ABDOMEN AND PELVIS WITH CONTRAST  Technique:  Multidetector CT imaging of the abdomen and pelvis was performed following the standard protocol during bolus administration of intravenous contrast.  Contrast: OMNIPAQUE IOHEXOL 300 MG/ML  SOLN  Comparison: Abdominal pelvic CT 03/12/2011.  Findings: Mild atelectasis is present at both lung bases.  There is no pleural effusion.  The liver, gallbladder, biliary system and pancreas appear  normal. The spleen and adrenal glands appear normal. The kidneys are stable with a small cyst in the lower pole of the left kidney.  There is no hydronephrosis.  There is a small hiatal hernia.  The stomach and small bowel otherwise appear normal.  There is fluid throughout the colon which is mildly distended.  There is mild colonic wall thickening and surrounding soft tissue stranding at the splenic flexure and extending into the proximal descending colon.  Sigmoid colon diverticular changes are present without surrounding inflammatory change.  There is no extraluminal fluid collection or evidence of bowel obstruction.  There is diffuse atherosclerosis of the aorta, iliac and proximal visceral arteries.  The patient appears to have replaced common hepatic artery.  No large vessel occlusion is identified.  The inferior mesenteric artery appears patent.  The portal and superior mesenteric veins are patent.  The uterus, adnexa and bladder appear unremarkable.  There is no lymphadenopathy.  There are degenerative changes throughout the spine associated with a mild convex right scoliosis.  No acute osseous findings are seen.  IMPRESSION:  1.  Focal colitis at the splenic flexure is nonspecific, although concerning for possible ischemic bowel based on the distribution. There is diffuse atherosclerosis without evidence of large vessel occlusion. 2.  No evidence of bowel obstruction or perforation. 3.  No significant solid parenchymal organ findings.  Critical Value/emergent results were called by telephone at the time of interpretation on 12/28/2011 at 1420 hours to Dr. Manus Gunning, who verbally acknowledged these results.   Original  Report Authenticated By: Gerrianne Scale, M.D.     Anti-infectives: Anti-infectives     Start     Dose/Rate Route Frequency Ordered Stop   12/28/11 1615   ciprofloxacin (CIPRO) IVPB 400 mg        400 mg 200 mL/hr over 60 Minutes Intravenous Every 12 hours 12/28/11 1604     12/28/11  1615   metroNIDAZOLE (FLAGYL) IVPB 500 mg        500 mg 100 mL/hr over 60 Minutes Intravenous Every 8 hours 12/28/11 1604     12/28/11 1430   metroNIDAZOLE (FLAGYL) IVPB 500 mg        500 mg 100 mL/hr over 60 Minutes Intravenous  Once 12/28/11 1421 12/28/11 1708   12/28/11 1430   ciprofloxacin (CIPRO) IVPB 400 mg        400 mg 200 mL/hr over 60 Minutes Intravenous  Once 12/28/11 1421 12/28/11 1538          Assessment/Plan: s/p * No surgery found * Ischemic colitis.  Clamp NG. Start clears.  Hypokalemia:  replace.  Will continue stepdown monitoring.  LOS: 2 days    Beryl Balz C 12/30/2011

## 2011-12-30 NOTE — Plan of Care (Signed)
Problem: Phase I Progression Outcomes Goal: OOB as tolerated unless otherwise ordered Outcome: Progressing Standby assist needed

## 2011-12-31 LAB — COMPREHENSIVE METABOLIC PANEL
ALT: 12 U/L (ref 0–35)
AST: 12 U/L (ref 0–37)
Alkaline Phosphatase: 52 U/L (ref 39–117)
CO2: 26 mEq/L (ref 19–32)
Chloride: 108 mEq/L (ref 96–112)
GFR calc Af Amer: 89 mL/min — ABNORMAL LOW (ref >90)
GFR calc non Af Amer: 77 mL/min — ABNORMAL LOW (ref >90)
Glucose, Bld: 115 mg/dL — ABNORMAL HIGH (ref 70–99)
Potassium: 3.1 mEq/L — ABNORMAL LOW (ref 3.5–5.1)
Sodium: 140 mEq/L (ref 135–145)
Total Bilirubin: 0.3 mg/dL (ref 0.3–1.2)

## 2011-12-31 LAB — CBC
Hemoglobin: 11.4 g/dL — ABNORMAL LOW (ref 12.0–15.0)
MCH: 29.8 pg (ref 26.0–34.0)
Platelets: 168 10*3/uL (ref 150–400)
RBC: 3.82 MIL/uL — ABNORMAL LOW (ref 3.87–5.11)
WBC: 10.1 10*3/uL (ref 4.0–10.5)

## 2011-12-31 MED ORDER — ATORVASTATIN CALCIUM 10 MG PO TABS
10.0000 mg | ORAL_TABLET | ORAL | Status: DC
  Administered 2012-01-01 – 2012-01-02 (×2): 10 mg via ORAL
  Filled 2011-12-31 (×2): qty 1

## 2011-12-31 MED ORDER — CLONAZEPAM 0.5 MG PO TABS
0.5000 mg | ORAL_TABLET | Freq: Three times a day (TID) | ORAL | Status: DC | PRN
  Administered 2011-12-31 – 2012-01-03 (×5): 0.5 mg via ORAL
  Filled 2011-12-31 (×5): qty 1

## 2011-12-31 MED ORDER — FENOFIBRATE 160 MG PO TABS
160.0000 mg | ORAL_TABLET | Freq: Every day | ORAL | Status: DC
  Administered 2011-12-31 – 2012-01-02 (×3): 160 mg via ORAL
  Filled 2011-12-31 (×5): qty 1

## 2011-12-31 MED ORDER — POTASSIUM CHLORIDE CRYS ER 20 MEQ PO TBCR
40.0000 meq | EXTENDED_RELEASE_TABLET | Freq: Once | ORAL | Status: AC
  Administered 2011-12-31: 40 meq via ORAL
  Filled 2011-12-31: qty 2

## 2011-12-31 MED ORDER — TRIAMTERENE-HCTZ 37.5-25 MG PO TABS
1.0000 | ORAL_TABLET | Freq: Every day | ORAL | Status: DC
  Administered 2011-12-31 – 2012-01-03 (×4): 1 via ORAL
  Filled 2011-12-31 (×4): qty 1

## 2011-12-31 NOTE — Progress Notes (Signed)
  Subjective: Nausea and pain is much improved.  +BM again.  Tolerating clears.  No fever or chills.  Objective: Vital signs in last 24 hours: Temp:  [98 F (36.7 C)-99.7 F (37.6 C)] 98.8 F (37.1 C) (09/01 0700) Pulse Rate:  [71-88] 88  (09/01 1300) Resp:  [15-24] 21  (09/01 1300) BP: (138-161)/(61-132) 161/67 mmHg (09/01 1200) SpO2:  [92 %-98 %] 98 % (09/01 1300) Weight:  [70.2 kg (154 lb 12.2 oz)] 70.2 kg (154 lb 12.2 oz) (09/01 0500) Last BM Date: 12/31/11  Intake/Output from previous day: 08/31 0701 - 09/01 0700 In: 3170 [P.O.:60; I.V.:2410; IV Piggyback:700] Out: 1001 [Urine:1000; Stool:1] Intake/Output this shift: Total I/O In: 1340 [P.O.:940; I.V.:300; IV Piggyback:100] Out: 450 [Urine:450]  General appearance: alert and no distress Resp: clear to auscultation bilaterally GI: +BS, soft, mild LUQ tenderness.  mild distention.  No peritoneal signs.  Lab Results:   Basename 12/31/11 0514 12/30/11 0930  WBC 10.1 10.5  HGB 11.4* 12.3  HCT 34.4* 37.6  PLT 168 151   BMET  Basename 12/31/11 0514 12/30/11 0930  NA 140 137  K 3.1* 2.8*  CL 108 102  CO2 26 26  GLUCOSE 115* 105*  BUN 10 15  CREATININE 0.67 0.77  CALCIUM 8.5 8.5   PT/INR No results found for this basename: LABPROT:2,INR:2 in the last 72 hours ABG No results found for this basename: PHART:2,PCO2:2,PO2:2,HCO3:2 in the last 72 hours  Studies/Results: No results found.  Anti-infectives: Anti-infectives     Start     Dose/Rate Route Frequency Ordered Stop   12/28/11 1615   ciprofloxacin (CIPRO) IVPB 400 mg        400 mg 200 mL/hr over 60 Minutes Intravenous Every 12 hours 12/28/11 1604     12/28/11 1615   metroNIDAZOLE (FLAGYL) IVPB 500 mg        500 mg 100 mL/hr over 60 Minutes Intravenous Every 8 hours 12/28/11 1604     12/28/11 1430   metroNIDAZOLE (FLAGYL) IVPB 500 mg        500 mg 100 mL/hr over 60 Minutes Intravenous  Once 12/28/11 1421 12/28/11 1708   12/28/11 1430    ciprofloxacin (CIPRO) IVPB 400 mg        400 mg 200 mL/hr over 60 Minutes Intravenous  Once 12/28/11 1421 12/28/11 1538          Assessment/Plan: s/p * No surgery found * Ischemic colitis.  Patient appears to be improving.  Continue to advance diet.  Increase activity.  Will resume home medications.  LOS: 3 days    Tekeisha Hakim C 12/31/2011

## 2011-12-31 NOTE — Progress Notes (Signed)
TRANSFER REPORT GIVEN TO JESSICA B  RN ON 200. PT BEING TRANSFERRED TO ROOM 204.  ALERT AND ORIENTED. PT HAS HAD MULTIPLE STOOL TODAY. VOIDING WELL. LT IV IS NSL.O2 SAT 96 ON ROOM AIR. SCD'S IN PLACE. FAMILY AT BEDSIDE

## 2012-01-01 LAB — CBC
MCH: 29.4 pg (ref 26.0–34.0)
Platelets: 194 10*3/uL (ref 150–400)
RBC: 3.98 MIL/uL (ref 3.87–5.11)
RDW: 13.8 % (ref 11.5–15.5)
WBC: 10.2 10*3/uL (ref 4.0–10.5)

## 2012-01-01 LAB — COMPREHENSIVE METABOLIC PANEL
ALT: 12 U/L (ref 0–35)
AST: 17 U/L (ref 0–37)
Albumin: 2.7 g/dL — ABNORMAL LOW (ref 3.5–5.2)
CO2: 25 mEq/L (ref 19–32)
Calcium: 8.7 mg/dL (ref 8.4–10.5)
Chloride: 103 mEq/L (ref 96–112)
GFR calc non Af Amer: 75 mL/min — ABNORMAL LOW (ref >90)
Sodium: 139 mEq/L (ref 135–145)

## 2012-01-01 MED ORDER — PANTOPRAZOLE SODIUM 40 MG PO TBEC
40.0000 mg | DELAYED_RELEASE_TABLET | Freq: Every day | ORAL | Status: DC
  Administered 2012-01-01 – 2012-01-03 (×3): 40 mg via ORAL
  Filled 2012-01-01 (×3): qty 1

## 2012-01-01 MED ORDER — SODIUM CHLORIDE 0.9 % IJ SOLN
INTRAMUSCULAR | Status: AC
  Administered 2012-01-01: 3 mL
  Filled 2012-01-01: qty 3

## 2012-01-01 MED ORDER — SODIUM CHLORIDE 0.9 % IV SOLN
Freq: Once | INTRAVENOUS | Status: AC
  Administered 2012-01-01: 11:00:00 via INTRAVENOUS

## 2012-01-01 MED ORDER — ALUM & MAG HYDROXIDE-SIMETH 200-200-20 MG/5ML PO SUSP
15.0000 mL | ORAL | Status: DC | PRN
  Administered 2012-01-01 – 2012-01-03 (×2): 15 mL via ORAL
  Filled 2012-01-01 (×2): qty 30

## 2012-01-01 MED ORDER — POTASSIUM CHLORIDE CRYS ER 20 MEQ PO TBCR
20.0000 meq | EXTENDED_RELEASE_TABLET | Freq: Three times a day (TID) | ORAL | Status: DC
  Administered 2012-01-01 – 2012-01-03 (×7): 20 meq via ORAL
  Filled 2012-01-01 (×7): qty 1

## 2012-01-01 NOTE — Progress Notes (Signed)
  Subjective: Taking by mouth soft diet. Minimal left-sided abdominal pain.  Objective: Vital signs in last 24 hours: Temp:  [98.2 F (36.8 C)-98.3 F (36.8 C)] 98.3 F (36.8 C) (09/02 0525) Pulse Rate:  [64-88] 64  (09/02 0525) Resp:  [17-22] 18  (09/02 0525) BP: (123-162)/(64-77) 123/68 mmHg (09/02 0525) SpO2:  [96 %-98 %] 96 % (09/02 0525) Last BM Date: 12/31/11  Intake/Output from previous day: 09/01 0701 - 09/02 0700 In: 1940 [P.O.:940; I.V.:300; IV Piggyback:700] Out: 650 [Urine:650] Intake/Output this shift:    General appearance: alert, cooperative and appears stated age Resp: clear to auscultation bilaterally Cardio: regular rate and rhythm, S1, S2 normal, no murmur, click, rub or gallop GI: soft, non-tender; bowel sounds normal; no masses,  no organomegaly  Lab Results:   [facility] 01/01/12 0420 12/31/11 0514  WBC 10.2 10.1  HGB 11.7* 11.4*  HCT 35.9* 34.4*  PLT 194 168   BMET  Basename 01/01/12 0420 12/31/11 0514  NA 139 140  K 2.8* 3.1*  CL 103 108  CO2 25 26  GLUCOSE 128* 115*  BUN 7 10  CREATININE 0.71 0.67  CALCIUM 8.7 8.5   PT/INR No results found for this basename: LABPROT:2,INR:2 in the last 72 hours  Studies/Results: No results found.  Anti-infectives: Anti-infectives     Start     Dose/Rate Route Frequency Ordered Stop   12/28/11 1615   ciprofloxacin (CIPRO) IVPB 400 mg        400 mg 200 mL/hr over 60 Minutes Intravenous Every 12 hours 12/28/11 1604     12/28/11 1615   metroNIDAZOLE (FLAGYL) IVPB 500 mg        500 mg 100 mL/hr over 60 Minutes Intravenous Every 8 hours 12/28/11 1604     12/28/11 1430   metroNIDAZOLE (FLAGYL) IVPB 500 mg        500 mg 100 mL/hr over 60 Minutes Intravenous  Once 12/28/11 1421 12/28/11 1708   12/28/11 1430   ciprofloxacin (CIPRO) IVPB 400 mg        400 mg 200 mL/hr over 60 Minutes Intravenous  Once 12/28/11 1421 12/28/11 1538          Assessment/Plan: Impression: Ischemic colitis,  resolving. Hypokalemia. Plan: Continue IV antibiotics. We'll increase K. Dur.  LOS: 4 days    Yamilee Harmes A 01/01/2012

## 2012-01-02 LAB — COMPREHENSIVE METABOLIC PANEL
ALT: 18 U/L (ref 0–35)
BUN: 7 mg/dL (ref 6–23)
CO2: 26 mEq/L (ref 19–32)
Calcium: 9.2 mg/dL (ref 8.4–10.5)
Creatinine, Ser: 0.77 mg/dL (ref 0.50–1.10)
GFR calc Af Amer: 85 mL/min — ABNORMAL LOW (ref >90)
GFR calc non Af Amer: 73 mL/min — ABNORMAL LOW (ref >90)
Glucose, Bld: 118 mg/dL — ABNORMAL HIGH (ref 70–99)
Sodium: 139 mEq/L (ref 135–145)
Total Protein: 5.9 g/dL — ABNORMAL LOW (ref 6.0–8.3)

## 2012-01-02 LAB — CBC
Hemoglobin: 13.1 g/dL (ref 12.0–15.0)
MCH: 29.4 pg (ref 26.0–34.0)
MCHC: 33.1 g/dL (ref 30.0–36.0)
MCV: 89 fL (ref 78.0–100.0)

## 2012-01-02 MED ORDER — METOCLOPRAMIDE HCL 10 MG PO TABS
5.0000 mg | ORAL_TABLET | Freq: Three times a day (TID) | ORAL | Status: DC
  Administered 2012-01-02 – 2012-01-03 (×3): 5 mg via ORAL
  Filled 2012-01-02 (×3): qty 1

## 2012-01-02 MED ORDER — SODIUM CHLORIDE 0.9 % IJ SOLN
INTRAMUSCULAR | Status: AC
  Administered 2012-01-02: 09:00:00
  Filled 2012-01-02: qty 3

## 2012-01-02 NOTE — Progress Notes (Signed)
  Subjective: No acute change.  Some nausea.  Poor appetite. Objective: Vital signs in last 24 hours: Temp:  [97.5 F (36.4 C)-99.2 F (37.3 C)] 98.3 F (36.8 C) (09/03 0418) Pulse Rate:  [69-76] 69  (09/03 0418) Resp:  [18] 18  (09/03 0418) BP: (143-147)/(78-79) 147/79 mmHg (09/03 0418) SpO2:  [95 %-98 %] 95 % (09/03 0418) Last BM Date: 01/02/12  Intake/Output from previous day: 09/02 0701 - 09/03 0700 In: 1160 [P.O.:460; IV Piggyback:700] Out: -  Intake/Output this shift: Total I/O In: 240 [P.O.:240] Out: -   General appearance: alert and no distress GI: soft, non-tender; bowel sounds normal; no masses,  no organomegaly  Lab Results:   [facility] 01/02/12 0536 01/01/12 0420  WBC 7.6 10.2  HGB 13.1 11.7*  HCT 39.6 35.9*  PLT 210 194   BMET  Basename 01/02/12 0536 01/01/12 0420  NA 139 139  K 3.3* 2.8*  CL 103 103  CO2 26 25  GLUCOSE 118* 128*  BUN 7 7  CREATININE 0.77 0.71  CALCIUM 9.2 8.7   PT/INR No results found for this basename: LABPROT:2,INR:2 in the last 72 hours ABG No results found for this basename: PHART:2,PCO2:2,PO2:2,HCO3:2 in the last 72 hours  Studies/Results: No results found.  Anti-infectives: Anti-infectives     Start     Dose/Rate Route Frequency Ordered Stop   12/28/11 1615   ciprofloxacin (CIPRO) IVPB 400 mg        400 mg 200 mL/hr over 60 Minutes Intravenous Every 12 hours 12/28/11 1604     12/28/11 1615   metroNIDAZOLE (FLAGYL) IVPB 500 mg        500 mg 100 mL/hr over 60 Minutes Intravenous Every 8 hours 12/28/11 1604     12/28/11 1430   metroNIDAZOLE (FLAGYL) IVPB 500 mg        500 mg 100 mL/hr over 60 Minutes Intravenous  Once 12/28/11 1421 12/28/11 1708   12/28/11 1430   ciprofloxacin (CIPRO) IVPB 400 mg        400 mg 200 mL/hr over 60 Minutes Intravenous  Once 12/28/11 1421 12/28/11 1538          Assessment/Plan: s/p * No surgery found * Ischemic colitis.  Improving.  Some persistent nausea.  Will trail  reglan.  Hopeful d/c tomorrw.  LOS: 5 days    Jadelyn Elks C 01/02/2012

## 2012-01-03 LAB — COMPREHENSIVE METABOLIC PANEL
ALT: 20 U/L (ref 0–35)
Alkaline Phosphatase: 62 U/L (ref 39–117)
BUN: 11 mg/dL (ref 6–23)
Chloride: 105 mEq/L (ref 96–112)
GFR calc Af Amer: 63 mL/min — ABNORMAL LOW (ref >90)
Glucose, Bld: 128 mg/dL — ABNORMAL HIGH (ref 70–99)
Potassium: 3.6 mEq/L (ref 3.5–5.1)
Sodium: 140 mEq/L (ref 135–145)
Total Bilirubin: 0.3 mg/dL (ref 0.3–1.2)
Total Protein: 5.9 g/dL — ABNORMAL LOW (ref 6.0–8.3)

## 2012-01-03 LAB — CBC
HCT: 39.9 % (ref 36.0–46.0)
Hemoglobin: 13.1 g/dL (ref 12.0–15.0)
RBC: 4.48 MIL/uL (ref 3.87–5.11)
WBC: 8.5 10*3/uL (ref 4.0–10.5)

## 2012-01-03 MED ORDER — ENSURE PUDDING PO PUDG: 1.0000 | Freq: Three times a day (TID) | ORAL | Status: DC

## 2012-01-03 MED ORDER — METOCLOPRAMIDE HCL 5 MG PO TABS: 5.0000 mg | ORAL_TABLET | Freq: Three times a day (TID) | ORAL | Status: DC

## 2012-01-03 MED ORDER — ENSURE PUDDING PO PUDG
1.0000 | Freq: Three times a day (TID) | ORAL | Status: DC
  Administered 2012-01-03: 1 via ORAL

## 2012-01-03 NOTE — Progress Notes (Signed)
IV removed, site WNL.  Pt given d/c instructions and new prescriptions.  Discussed home care with patient and her son, also discussed home medications, patient verbalizes understanding, teachback completed. F/U appointment to be made by patient/family member, pt states they will keep appointment. Pt is stable at this time.  Home health has been arranged for pt.

## 2012-01-05 NOTE — Discharge Summary (Signed)
Physician Discharge Summary  Patient ID: Alexandra Riley MRN: [id number] DOB/AGE: 11/19/1921 78 y.o.  Admit date: 12/28/2011 Discharge date: 01/03/2012  Admission Diagnoses: Ischemic colitis  Discharge Diagnoses: The same Active Problems:  * No active hospital problems. *    Discharged Condition: stable  Hospital Course: Patient presented to [facility] emergency department with left upper quadrant abdominal pain and obstipation. Workup and evaluation was suspicious for ischemic colitis. There is no evidence of any perforation and no evidence of any peritoneal signs. Patient was admitted for initial conservative management with expectations that should she progress to be taken to the operating room. Her symptomatology continue to improve as well as her leukocytosis. Upon resumption of bowel function she was advanced on a diet. She tolerated this well. With a normal white blood cell count, tolerating regular diet, having regular bowel functions, and being and the story with assistance plans were made for discharge. Patient will be established with a home health aide and physical therapy to continue patient activity. Patient's family has been updated and is comfortable with plans for discharge.  Consults: None  Significant Diagnostic Studies: labs: Daily labs, CT of the abdomen and pelvis on admission  Treatments: IV hydration, antibiotics: Cipro and metronidazole and analgesia: Dilaudid  Discharge Exam: Blood pressure 114/70, pulse 77, temperature 98.1 F (36.7 C), temperature source Oral, resp. rate 18, height 5\' 5"  (1.651 m), weight 70.2 kg (154 lb 12.2 oz), SpO2 96.00%. General appearance: alert and no distress Resp: clear to auscultation bilaterally Cardio: regular rate and rhythm GI: Positive bowel sounds, soft, flat, mild left upper quadrant abdominal tenderness. No diffuse peritoneal signs. No hernias or masses.  Disposition: 01-Home or Self Care  Discharge Orders    Future  Orders Please Complete By Expires   Diet - low sodium heart healthy      Increase activity slowly      Discharge instructions      Comments:   Increase activity as tolerated.  High fiber diet.     Medication List  As of 01/05/2012  9:09 AM   TAKE these medications         aspirin 81 MG chewable tablet   Chew 81 mg by mouth daily.      atorvastatin 10 MG tablet   Commonly known as: LIPITOR   Take 10 mg by mouth every morning.      carisoprodol 350 MG tablet   Commonly known as: SOMA   Take 350 mg by mouth 4 (four) times daily as needed. For sleep and muscle relaxant      CITRACAL + D PO   Take 1 tablet by mouth daily.      clonazePAM 0.5 MG tablet   Commonly known as: KLONOPIN   Take 0.5 mg by mouth 3 (three) times daily as needed. For anxiety      feeding supplement Pudg   Take 1 Container by mouth 3 (three) times daily with meals.      fenofibrate 160 MG tablet   Take 160 mg by mouth at bedtime.      HYDROcodone-acetaminophen 7.5-750 MG per tablet   Commonly known as: VICODIN ES   Take 1 tablet by mouth every 4 (four) hours as needed. For pain      meclizine 25 MG tablet   Commonly known as: ANTIVERT   Take 25 mg by mouth 3 (three) times daily as needed. For nausea      methyl salicylate-menthol ointment   Apply 1 application topically  2 (two) times daily as needed. For muscle pain        metoCLOPramide 5 MG tablet   Commonly known as: REGLAN   Take 1 tablet (5 mg total) by mouth 3 (three) times daily before meals.      multivitamin-iron-minerals-folic acid chewable tablet   Chew 1 tablet by mouth daily.      promethazine 25 MG tablet   Commonly known as: PHENERGAN   Take 25 mg by mouth every 6 (six) hours as needed. For nausea      triamterene-hydrochlorothiazide 37.5-25 MG per tablet   Commonly known as: MAXZIDE-25   Take 1 tablet by mouth daily.           Follow-up Information    Follow up with Zlata Alcaide C, MD. Call in 1 week. ([facility] [phone] as needed)    Contact information:   [address] [place] [address] [phone]          Signed: Fabio Bering 01/05/2012, 9:09 AM

## 2013-01-15 ENCOUNTER — Encounter (HOSPITAL_COMMUNITY): Payer: Self-pay | Admitting: Pharmacy Technician

## 2013-01-16 ENCOUNTER — Encounter (HOSPITAL_COMMUNITY)
Admission: RE | Admit: 2013-01-16 | Discharge: 2013-01-16 | Disposition: A | Discharge status: Home / Self Care | Payer: Medicare Other | Source: Ambulatory Visit | Attending: Ophthalmology | Admitting: Ophthalmology

## 2013-01-16 ENCOUNTER — Other Ambulatory Visit: Payer: Self-pay

## 2013-01-16 ENCOUNTER — Encounter (HOSPITAL_COMMUNITY): Payer: Self-pay

## 2013-01-16 DIAGNOSIS — Z01812 Encounter for preprocedural laboratory examination: Secondary | ICD-10-CM | POA: Insufficient documentation

## 2013-01-16 HISTORY — DX: Shortness of breath: R06.02

## 2013-01-16 LAB — BASIC METABOLIC PANEL
BUN: 14 mg/dL (ref 6–23)
CO2: 22 mEq/L (ref 19–32)
Calcium: 9.9 mg/dL (ref 8.4–10.5)
Glucose, Bld: 76 mg/dL (ref 70–99)
Sodium: 141 mEq/L (ref 135–145)

## 2013-01-16 NOTE — Patient Instructions (Addendum)
Your procedure is scheduled on:  01/20/2013  Report to [facility] at  9:00    AM.  Call this number if you have problems the morning of surgery: [phone]   Remember:   Do not eat or drink :After Midnight.    Take these medicines the morning of surgery with A SIP OF WATER: Klonopin   Do not wear jewelry, make-up or nail polish.  Do not wear lotions, powders, or perfumes. You may wear deodorant.  Do not shave 48 hours prior to surgery.  Do not bring valuables to the hospital.  Contacts, dentures or bridgework may not be worn into surgery.  Patients discharged the day of surgery will not be allowed to drive home.  Name and phone number of your driver:    Please read over the following fact sheets that you were given: Pain Booklet, Surgical Site Infection Prevention, Anesthesia Post-op Instructions and Care and Recovery After Surgery  Cataract Surgery  A cataract is a clouding of the lens of the eye. When a lens becomes cloudy, vision is reduced based on the degree and nature of the clouding. Surgery may be needed to improve vision. Surgery removes the cloudy lens and usually replaces it with a substitute lens (intraocular lens, IOL). LET YOUR EYE DOCTOR KNOW ABOUT:  Allergies to food or medicine.   Medicines taken including herbs, eyedrops, over-the-counter medicines, and creams.   Use of steroids (by mouth or creams).   Previous problems with anesthetics or numbing medicine.   History of bleeding problems or blood clots.   Previous surgery.   Other health problems, including diabetes and kidney problems.   Possibility of pregnancy, if this applies.  RISKS AND COMPLICATIONS  Infection.   Inflammation of the eyeball (endophthalmitis) that can spread to both eyes (sympathetic ophthalmia).   Poor wound healing.   If an IOL is inserted, it can later fall out of proper position. This is very uncommon.   Clouding of the part of your eye that holds an IOL in place. This is  called an "after-cataract." These are uncommon, but easily treated.  BEFORE THE PROCEDURE  Do not eat or drink anything except small amounts of water for 8 to 12 before your surgery, or as directed by your caregiver.   Unless you are told otherwise, continue any eyedrops you have been prescribed.   Talk to your primary caregiver about all other medicines that you take (both prescription and non-prescription). In some cases, you may need to stop or change medicines near the time of your surgery. This is most important if you are taking blood-thinning medicine.Do not stop medicines unless you are told to do so.   Arrange for someone to drive you to and from the procedure.   Do not put contact lenses in either eye on the day of your surgery.  PROCEDURE There is more than one method for safely removing a cataract. Your doctor can explain the differences and help determine which is best for you. Phacoemulsification surgery is the most common form of cataract surgery.  An injection is given behind the eye or eyedrops are given to make this a painless procedure.   A small cut (incision) is made on the edge of the clear, dome-shaped surface that covers the front of the eye (cornea).   A tiny probe is painlessly inserted into the eye. This device gives off ultrasound waves that soften and break up the cloudy center of the lens. This makes it easier for  the cloudy lens to be removed by suction.   An IOL may be implanted.   The normal lens of the eye is covered by a clear capsule. Part of that capsule is intentionally left in the eye to support the IOL.   Your surgeon may or may not use stitches to close the incision.  There are other forms of cataract surgery that require a larger incision and stiches to close the eye. This approach is taken in cases where the doctor feels that the cataract cannot be easily removed using phacoemulsification. AFTER THE PROCEDURE  When an IOL is implanted, it  does not need care. It becomes a permanent part of your eye and cannot be seen or felt.   Your doctor will schedule follow-up exams to check on your progress.   Review your other medicines with your doctor to see which can be resumed after surgery.   Use eyedrops or take medicine as prescribed by your doctor.  Document Released: 04/06/2011 Document Reviewed: 04/03/2011 [facility] Patient Information 2012 [place].  .Cataract Surgery Care After Refer to this sheet in the next few weeks. These instructions provide you with information on caring for yourself after your procedure. Your caregiver may also give you more specific instructions. Your treatment has been planned according to current medical practices, but problems sometimes occur. Call your caregiver if you have any problems or questions after your procedure.  HOME CARE INSTRUCTIONS   Avoid strenuous activities as directed by your caregiver.   Ask your caregiver when you can resume driving.   Use eyedrops or other medicines to help healing and control pressure inside your eye as directed by your caregiver.   Only take over-the-counter or prescription medicines for pain, discomfort, or fever as directed by your caregiver.   Do not to touch or rub your eyes.   You may be instructed to use a protective shield during the first few days and nights after surgery. If not, wear sunglasses to protect your eyes. This is to protect the eye from pressure or from being accidentally bumped.   Keep the area around your eye clean and dry. Avoid swimming or allowing water to hit you directly in the face while showering. Keep soap and shampoo out of your eyes.   Do not bend or lift heavy objects. Bending increases pressure in the eye. You can walk, climb stairs, and do light household chores.   Do not put a contact lens into the eye that had surgery until your caregiver says it is okay to do so.   Ask your doctor when you can return to  work. This will depend on the kind of work that you do. If you work in a dusty environment, you may be advised to wear protective eyewear for a period of time.   Ask your caregiver when it will be safe to engage in sexual activity.   Continue with your regular eye exams as directed by your caregiver.  What to expect:  It is normal to feel itching and mild discomfort for a few days after cataract surgery. Some fluid discharge is also common, and your eye may be sensitive to light and touch.   After 1 to 2 days, even moderate discomfort should disappear. In most cases, healing will take about 6 weeks.   If you received an intraocular lens (IOL), you may notice that colors are very bright or have a blue tinge. Also, if you have been in bright sunlight, everything may appear  reddish for a few hours. If you see these color tinges, it is because your lens is clear and no longer cloudy. Within a few months after receiving an IOL, these extra colors should go away. When you have healed, you will probably need new glasses.  SEEK MEDICAL CARE IF:   You have increased bruising around your eye.   You have discomfort not helped by medicine.  SEEK IMMEDIATE MEDICAL CARE IF:   You have a fever.   You have a worsening or sudden vision loss.   You have redness, swelling, or increasing pain in the eye.   You have a thick discharge from the eye that had surgery.  MAKE SURE YOU:  Understand these instructions.   Will watch your condition.   Will get help right away if you are not doing well or get worse.  Document Released: 11/04/2004 Document Revised: 04/06/2011 Document Reviewed: 12/09/2010 [facility] Patient Information 2012 [place].

## 2013-01-20 ENCOUNTER — Encounter (HOSPITAL_COMMUNITY): Payer: Self-pay | Admitting: Anesthesiology

## 2013-01-20 ENCOUNTER — Encounter (HOSPITAL_COMMUNITY): Payer: Self-pay | Admitting: *Deleted

## 2013-01-20 ENCOUNTER — Encounter (HOSPITAL_COMMUNITY)
Admission: RE | Disposition: A | Discharge status: Home / Self Care | Payer: Self-pay | Source: Ambulatory Visit | Attending: Ophthalmology

## 2013-01-20 ENCOUNTER — Ambulatory Visit (HOSPITAL_COMMUNITY): Payer: Medicare Other | Admitting: Anesthesiology

## 2013-01-20 ENCOUNTER — Ambulatory Visit (HOSPITAL_COMMUNITY)
Admission: RE | Admit: 2013-01-20 | Discharge: 2013-01-20 | Disposition: A | Discharge status: Home / Self Care | Payer: Medicare Other | Source: Ambulatory Visit | Attending: Ophthalmology | Admitting: Ophthalmology

## 2013-01-20 DIAGNOSIS — H2589 Other age-related cataract: Secondary | ICD-10-CM | POA: Insufficient documentation

## 2013-01-20 HISTORY — PX: CATARACT EXTRACTION W/PHACO: SHX586

## 2013-01-20 SURGERY — PHACOEMULSIFICATION, CATARACT, WITH IOL INSERTION
Anesthesia: Monitor Anesthesia Care | Site: Eye | Laterality: Right | Wound class: Clean

## 2013-01-20 MED ORDER — POVIDONE-IODINE 5 % OP SOLN
OPHTHALMIC | Status: DC | PRN
  Administered 2013-01-20: 1 via OPHTHALMIC

## 2013-01-20 MED ORDER — NEOMYCIN-POLYMYXIN-DEXAMETH 0.1 % OP OINT
TOPICAL_OINTMENT | OPHTHALMIC | Status: DC | PRN
  Administered 2013-01-20: 1 via OPHTHALMIC

## 2013-01-20 MED ORDER — LIDOCAINE HCL (PF) 1 % IJ SOLN
INTRAMUSCULAR | Status: DC | PRN
  Administered 2013-01-20: .5 mL

## 2013-01-20 MED ORDER — LACTATED RINGERS IV SOLN
INTRAVENOUS | Status: DC
  Administered 2013-01-20: 1000 mL via INTRAVENOUS

## 2013-01-20 MED ORDER — LACTATED RINGERS IV SOLN
INTRAVENOUS | Status: DC | PRN
  Administered 2013-01-20: 10:00:00 via INTRAVENOUS

## 2013-01-20 MED ORDER — PHENYLEPHRINE HCL 2.5 % OP SOLN
1.0000 [drp] | OPHTHALMIC | Status: AC
  Administered 2013-01-20 (×3): 1 [drp] via OPHTHALMIC

## 2013-01-20 MED ORDER — ONDANSETRON HCL 4 MG/2ML IJ SOLN: 4.0000 mg | Freq: Once | INTRAMUSCULAR | Status: DC | PRN

## 2013-01-20 MED ORDER — LIDOCAINE HCL 3.5 % OP GEL
1.0000 "application " | Freq: Once | OPHTHALMIC | Status: AC
  Administered 2013-01-20: 1 via OPHTHALMIC

## 2013-01-20 MED ORDER — BSS IO SOLN
INTRAOCULAR | Status: DC | PRN
  Administered 2013-01-20: 15 mL via INTRAOCULAR

## 2013-01-20 MED ORDER — PROVISC 10 MG/ML IO SOLN
INTRAOCULAR | Status: DC | PRN
  Administered 2013-01-20: 8.5 mg via INTRAOCULAR

## 2013-01-20 MED ORDER — MIDAZOLAM HCL 2 MG/2ML IJ SOLN
INTRAMUSCULAR | Status: AC
  Filled 2013-01-20: qty 2

## 2013-01-20 MED ORDER — MIDAZOLAM HCL 2 MG/2ML IJ SOLN
1.0000 mg | INTRAMUSCULAR | Status: AC | PRN
  Administered 2013-01-20 (×2): 1 mg via INTRAVENOUS
  Administered 2013-01-20: 2 mg via INTRAVENOUS

## 2013-01-20 MED ORDER — CYCLOPENTOLATE-PHENYLEPHRINE 0.2-1 % OP SOLN
1.0000 [drp] | OPHTHALMIC | Status: AC
  Administered 2013-01-20 (×3): 1 [drp] via OPHTHALMIC

## 2013-01-20 MED ORDER — EPINEPHRINE HCL 1 MG/ML IJ SOLN
INTRAOCULAR | Status: DC | PRN
  Administered 2013-01-20: 10:00:00

## 2013-01-20 MED ORDER — FENTANYL CITRATE 0.05 MG/ML IJ SOLN: 25.0000 ug | INTRAMUSCULAR | Status: DC | PRN

## 2013-01-20 MED ORDER — TETRACAINE HCL 0.5 % OP SOLN
1.0000 [drp] | OPHTHALMIC | Status: AC
  Administered 2013-01-20 (×3): 1 [drp] via OPHTHALMIC

## 2013-01-20 MED ORDER — EPINEPHRINE HCL 1 MG/ML IJ SOLN
INTRAMUSCULAR | Status: AC
  Filled 2013-01-20: qty 1

## 2013-01-20 MED ORDER — LIDOCAINE 3.5 % OP GEL OPTIME - NO CHARGE
OPHTHALMIC | Status: DC | PRN
  Administered 2013-01-20: 2 [drp] via OPHTHALMIC

## 2013-01-20 MED ORDER — FENTANYL CITRATE 0.05 MG/ML IJ SOLN
INTRAMUSCULAR | Status: AC
  Filled 2013-01-20: qty 2

## 2013-01-20 SURGICAL SUPPLY — 32 items
CAPSULAR TENSION RING-AMO (OPHTHALMIC RELATED) IMPLANT
CLOTH BEACON ORANGE TIMEOUT ST (SAFETY) ×1 IMPLANT
EYE SHIELD UNIVERSAL CLEAR (GAUZE/BANDAGES/DRESSINGS) ×1 IMPLANT
GLOVE BIO SURGEON STRL SZ 6.5 (GLOVE) IMPLANT
GLOVE BIOGEL PI IND STRL 6.5 (GLOVE) IMPLANT
GLOVE BIOGEL PI IND STRL 7.0 (GLOVE) IMPLANT
GLOVE BIOGEL PI IND STRL 7.5 (GLOVE) IMPLANT
GLOVE BIOGEL PI INDICATOR 6.5 (GLOVE) ×1
GLOVE BIOGEL PI INDICATOR 7.0 (GLOVE)
GLOVE BIOGEL PI INDICATOR 7.5 (GLOVE)
GLOVE ECLIPSE 6.5 STRL STRAW (GLOVE) IMPLANT
GLOVE ECLIPSE 7.0 STRL STRAW (GLOVE) IMPLANT
GLOVE ECLIPSE 7.5 STRL STRAW (GLOVE) IMPLANT
GLOVE EXAM NITRILE LRG STRL (GLOVE) ×1 IMPLANT
GLOVE EXAM NITRILE MD LF STRL (GLOVE) IMPLANT
GLOVE SKINSENSE NS SZ6.5 (GLOVE)
GLOVE SKINSENSE NS SZ7.0 (GLOVE)
GLOVE SKINSENSE STRL SZ6.5 (GLOVE) IMPLANT
GLOVE SKINSENSE STRL SZ7.0 (GLOVE) IMPLANT
KIT VITRECTOMY (OPHTHALMIC RELATED) IMPLANT
PAD ARMBOARD 7.5X6 YLW CONV (MISCELLANEOUS) ×1 IMPLANT
PROC W NO LENS (INTRAOCULAR LENS)
PROC W SPEC LENS (INTRAOCULAR LENS)
PROCESS W NO LENS (INTRAOCULAR LENS) IMPLANT
PROCESS W SPEC LENS (INTRAOCULAR LENS) IMPLANT
RING MALYGIN (MISCELLANEOUS) IMPLANT
SIGHTPATH CAT PROC W REG LENS (Ophthalmic Related) ×2 IMPLANT
SYR TB 1ML LL NO SAFETY (SYRINGE) ×1 IMPLANT
TAPE SURG TRANSPORE 1 IN (GAUZE/BANDAGES/DRESSINGS) IMPLANT
TAPE SURGICAL TRANSPORE 1 IN (GAUZE/BANDAGES/DRESSINGS) ×1
VISCOELASTIC ADDITIONAL (OPHTHALMIC RELATED) IMPLANT
WATER STERILE IRR 250ML POUR (IV SOLUTION) ×1 IMPLANT

## 2013-01-20 NOTE — H&P (Signed)
I have reviewed the H&P, the patient was re-examined, and I have identified no interval changes in medical condition and plan of care since the history and physical of record  

## 2013-01-20 NOTE — Preoperative (Signed)
Beta Blockers   Reason not to administer Beta Blockers:Not Applicable 

## 2013-01-20 NOTE — Op Note (Signed)
Date of Admission: 01/20/2013  Date of Surgery: 01/20/2013  Pre-Op Dx: Cataract  Right  Eye  Post-Op Dx: Combined Cataract  Right  Eye,  Dx Code 366.19  Surgeon: Gemma Payor, M.D.  Assistants: None  Anesthesia: Topical with MAC  Indications: Painless, progressive loss of vision with compromise of daily activities.  Surgery: Cataract Extraction with Intraocular lens Implant Right Eye  Discription: The patient had dilating drops and viscous lidocaine placed into the right eye in the pre-op holding area. After transfer to the operating room, a time out was performed. The patient was then prepped and draped. Beginning with a 75 degree blade a paracentesis port was made at the surgeon's 2 o'clock position. The anterior chamber was then filled with 1% non-preserved lidocaine. This was followed by filling the anterior chamber with Provisc.  A 2.68mm keratome blade was used to make a clear corneal incision at the temporal limbus.  A bent cystatome needle was used to create a continuous tear capsulotomy. Hydrodissection was performed with balanced salt solution on a Fine canula. The lens nucleus was then removed using the phacoemulsification handpiece. Residual cortex was removed with the I&A handpiece. The anterior chamber and capsular bag were refilled with Provisc. A posterior chamber intraocular lens was placed into the capsular bag with it's injector. The implant was positioned with the Kuglan hook. The Provisc was then removed from the anterior chamber and capsular bag with the I&A handpiece. Stromal hydration of the main incision and paracentesis port was performed with BSS on a Fine canula. The wounds were tested for leak which was negative. The patient tolerated the procedure well. There were no operative complications. The patient was then transferred to the recovery room in stable condition.  Complications: None  Specimen: None  EBL: None  Prosthetic device: B&L enVista, MX60, power 22.0D, SN  [id number].

## 2013-01-20 NOTE — Anesthesia Procedure Notes (Signed)
Procedure Name: MAC Date/Time: 01/20/2013 10:24 AM Performed by: Carolyne Littles, AMY L Pre-anesthesia Checklist: Patient identified, Timeout performed, Emergency Drugs available, Suction available and Patient being monitored Oxygen Delivery Method: Nasal cannula

## 2013-01-20 NOTE — Anesthesia Postprocedure Evaluation (Signed)
  Anesthesia Post-op Note  Patient: Alexandra Riley  Procedure(s) Performed: Procedure(s) with comments: CATARACT EXTRACTION PHACO AND INTRAOCULAR LENS PLACEMENT (IOC) (Right) - CDE:  18.27  Patient Location: Short Stay  Anesthesia Type:MAC  Level of Consciousness: awake, alert , oriented and patient cooperative  Airway and Oxygen Therapy: Patient Spontanous Breathing  Post-op Pain: none  Post-op Assessment: Post-op Vital signs reviewed, Patient's Cardiovascular Status Stable, Respiratory Function Stable, Patent Airway, No signs of Nausea or vomiting and Pain level controlled  Post-op Vital Signs: Reviewed and stable  Complications: No apparent anesthesia complications

## 2013-01-20 NOTE — Anesthesia Preprocedure Evaluation (Signed)
Anesthesia Evaluation  Patient identified by MRN, date of birth, ID band Patient awake    Reviewed: Allergy & Precautions, H&P , NPO status , Patient's Chart, lab work & pertinent test results  Airway Mallampati: II      Dental  (+) Edentulous Upper and Edentulous Lower   Pulmonary shortness of breath and with exertion,  breath sounds clear to auscultation        Cardiovascular negative cardio ROS  Rhythm:Regular Rate:Normal     Neuro/Psych Anxiety    GI/Hepatic negative GI ROS,   Endo/Other    Renal/GU      Musculoskeletal   Abdominal   Peds  Hematology   Anesthesia Other Findings   Reproductive/Obstetrics                           Anesthesia Physical Anesthesia Plan  ASA: III  Anesthesia Plan: MAC   Post-op Pain Management:    Induction: Intravenous  Airway Management Planned: Nasal Cannula  Additional Equipment:   Intra-op Plan:   Post-operative Plan:   Informed Consent: I have reviewed the patients History and Physical, chart, labs and discussed the procedure including the risks, benefits and alternatives for the proposed anesthesia with the patient or authorized representative who has indicated his/her understanding and acceptance.     Plan Discussed with:   Anesthesia Plan Comments:         Anesthesia Quick Evaluation

## 2013-01-20 NOTE — Transfer of Care (Signed)
Immediate Anesthesia Transfer of Care Note  Patient: Alexandra Riley  Procedure(s) Performed: Procedure(s) with comments: CATARACT EXTRACTION PHACO AND INTRAOCULAR LENS PLACEMENT (IOC) (Right) - CDE:  18.27  Patient Location: Short Stay  Anesthesia Type:MAC  Level of Consciousness: awake, alert , oriented and patient cooperative  Airway & Oxygen Therapy: Patient Spontanous Breathing  Post-op Assessment: Report given to PACU RN and Post -op Vital signs reviewed and stable  Post vital signs: Reviewed and stable  Complications: No apparent anesthesia complications

## 2013-01-21 ENCOUNTER — Encounter (HOSPITAL_COMMUNITY): Payer: Self-pay | Admitting: Ophthalmology

## 2013-03-14 ENCOUNTER — Inpatient Hospital Stay (HOSPITAL_COMMUNITY)
Admission: EM | Admit: 2013-03-14 | Discharge: 2013-03-19 | DRG: 603 | Disposition: A | Discharge status: Home Health | Payer: Medicare Other | Attending: Family Medicine | Admitting: Family Medicine

## 2013-03-14 ENCOUNTER — Encounter (HOSPITAL_COMMUNITY): Payer: Self-pay | Admitting: Emergency Medicine

## 2013-03-14 DIAGNOSIS — E785 Hyperlipidemia, unspecified: Secondary | ICD-10-CM | POA: Diagnosis present

## 2013-03-14 DIAGNOSIS — Z7982 Long term (current) use of aspirin: Secondary | ICD-10-CM

## 2013-03-14 DIAGNOSIS — K5900 Constipation, unspecified: Secondary | ICD-10-CM | POA: Diagnosis present

## 2013-03-14 DIAGNOSIS — L02419 Cutaneous abscess of limb, unspecified: Principal | ICD-10-CM | POA: Diagnosis present

## 2013-03-14 DIAGNOSIS — L03116 Cellulitis of left lower limb: Secondary | ICD-10-CM

## 2013-03-14 DIAGNOSIS — M810 Age-related osteoporosis without current pathological fracture: Secondary | ICD-10-CM | POA: Diagnosis present

## 2013-03-14 DIAGNOSIS — Z79899 Other long term (current) drug therapy: Secondary | ICD-10-CM

## 2013-03-14 LAB — BASIC METABOLIC PANEL
BUN: 12 mg/dL (ref 6–23)
CO2: 25 mEq/L (ref 19–32)
Calcium: 9.9 mg/dL (ref 8.4–10.5)
Chloride: 103 mEq/L (ref 96–112)
GFR calc Af Amer: 72 mL/min — ABNORMAL LOW (ref >90)
GFR calc non Af Amer: 62 mL/min — ABNORMAL LOW (ref >90)
Glucose, Bld: 98 mg/dL (ref 70–99)
Potassium: 3.4 mEq/L — ABNORMAL LOW (ref 3.5–5.1)
Sodium: 141 mEq/L (ref 135–145)

## 2013-03-14 LAB — CBC WITH DIFFERENTIAL/PLATELET
Basophils Relative: 0 % (ref 0–1)
Eosinophils Absolute: 0.1 10*3/uL (ref 0.0–0.7)
Eosinophils Relative: 1 % (ref 0–5)
HCT: 38.1 % (ref 36.0–46.0)
Hemoglobin: 12.2 g/dL (ref 12.0–15.0)
Lymphs Abs: 1.5 10*3/uL (ref 0.7–4.0)
MCH: 29.8 pg (ref 26.0–34.0)
MCHC: 32 g/dL (ref 30.0–36.0)
Neutro Abs: 5.7 10*3/uL (ref 1.7–7.7)
Neutrophils Relative %: 67 % (ref 43–77)
Platelets: 230 10*3/uL (ref 150–400)
RBC: 4.1 MIL/uL (ref 3.87–5.11)

## 2013-03-14 MED ORDER — CLONAZEPAM 0.5 MG PO TABS
0.5000 mg | ORAL_TABLET | Freq: Three times a day (TID) | ORAL | Status: DC | PRN
  Administered 2013-03-15 – 2013-03-19 (×10): 0.5 mg via ORAL
  Filled 2013-03-14 (×12): qty 1

## 2013-03-14 MED ORDER — ASPIRIN 81 MG PO CHEW
81.0000 mg | CHEWABLE_TABLET | Freq: Every day | ORAL | Status: DC
  Administered 2013-03-15 – 2013-03-19 (×5): 81 mg via ORAL
  Filled 2013-03-14 (×6): qty 1

## 2013-03-14 MED ORDER — VANCOMYCIN HCL IN DEXTROSE 1-5 GM/200ML-% IV SOLN
1000.0000 mg | Freq: Once | INTRAVENOUS | Status: AC
  Administered 2013-03-14: 1000 mg via INTRAVENOUS
  Filled 2013-03-14: qty 200

## 2013-03-14 MED ORDER — CARISOPRODOL 350 MG PO TABS
350.0000 mg | ORAL_TABLET | Freq: Three times a day (TID) | ORAL | Status: DC
  Administered 2013-03-15 – 2013-03-19 (×14): 350 mg via ORAL
  Filled 2013-03-14 (×14): qty 1

## 2013-03-14 MED ORDER — ONDANSETRON HCL 4 MG PO TABS
4.0000 mg | ORAL_TABLET | Freq: Three times a day (TID) | ORAL | Status: DC | PRN
  Administered 2013-03-17: 4 mg via ORAL
  Filled 2013-03-14 (×2): qty 1

## 2013-03-14 MED ORDER — ATORVASTATIN CALCIUM 10 MG PO TABS
10.0000 mg | ORAL_TABLET | Freq: Every day | ORAL | Status: DC
  Administered 2013-03-15 – 2013-03-18 (×4): 10 mg via ORAL
  Filled 2013-03-14 (×5): qty 1

## 2013-03-14 MED ORDER — SODIUM CHLORIDE 0.9 % IV SOLN
1250.0000 mg | INTRAVENOUS | Status: DC
  Administered 2013-03-15 – 2013-03-19 (×5): 1250 mg via INTRAVENOUS
  Filled 2013-03-14 (×6): qty 1250

## 2013-03-14 MED ORDER — ENOXAPARIN SODIUM 30 MG/0.3ML ~~LOC~~ SOLN: 30.0000 mg | SUBCUTANEOUS | Status: DC

## 2013-03-14 MED ORDER — ENOXAPARIN SODIUM 40 MG/0.4ML ~~LOC~~ SOLN
40.0000 mg | SUBCUTANEOUS | Status: DC
  Administered 2013-03-15 – 2013-03-19 (×5): 40 mg via SUBCUTANEOUS
  Filled 2013-03-14 (×6): qty 0.4

## 2013-03-14 NOTE — Progress Notes (Signed)
ANTIBIOTIC CONSULT NOTE - INITIAL  Pharmacy Consult for Vancomycin Indication: cellulitis  No Known Allergies  Patient Measurements: Height: 5\' 5"  (165.1 cm) Weight: 151 lb 10.8 oz (68.8 kg) IBW/kg (Calculated) : 57  Vital Signs: Temp: 98.3 F (36.8 C) (11/14 2303) Temp src: Oral (11/14 2303) BP: 144/66 mmHg (11/14 2303) Pulse Rate: 74 (11/14 2303) Intake/Output from previous day:   Intake/Output from this shift:    Labs:  Recent Labs  03/14/13 2006  WBC 8.5  HGB 12.2  PLT 230  CREATININE 0.82   Estimated Creatinine Clearance: 46.2 ml/min (by C-G formula based on Cr of 0.82). No results found for this basename: VANCOTROUGH, VANCOPEAK, VANCORANDOM, GENTTROUGH, GENTPEAK, GENTRANDOM, TOBRATROUGH, TOBRAPEAK, TOBRARND, AMIKACINPEAK, AMIKACINTROU, AMIKACIN,  in the last 72 hours   Microbiology: No results found for this or any previous visit (from the past 720 hour(s)).  Medical History: Past Medical History  Diagnosis Date  . Osteoporosis   . High cholesterol   . Constipation   . Glaucoma   . Shortness of breath on exertion     Medications:  Scheduled:  . [START ON 03/15/2013] aspirin  81 mg Oral Daily  . [START ON 03/15/2013] atorvastatin  10 mg Oral q1800  . carisoprodol  350 mg Oral Q8H  . [START ON 03/15/2013] enoxaparin (LOVENOX) injection  40 mg Subcutaneous Q24H  . [START ON 03/15/2013] vancomycin  1,250 mg Intravenous Q24H   Assessment: 79 yo F with LLE cellulitis.   Renal function is at baseline.   Goal of Therapy:  Vancomycin trough level 10-15 mcg/ml  Plan:  Vancomycin 1250mg  IV q24h Check Vancomycin trough at steady state Monitor renal function and cx data   Elson Clan 03/14/2013,11:38 PM

## 2013-03-14 NOTE — ED Notes (Signed)
Lt lower leg red and swollen.  Has sore areas on rt foot.

## 2013-03-14 NOTE — ED Provider Notes (Signed)
CSN: [id number]     Arrival date & time 03/14/13  1639 History  This chart was scribed for Donnetta Hutching, MD by Danella Maiers, ED Scribe. This patient was seen in room APA09/APA09 and the patient's care was started at 7:20 PM.     Chief Complaint  Patient presents with  . Leg Pain   The history is provided by the patient. No language interpreter was used.   Level 5 Caveat - Dementia  HPI Comments: Alexandra Riley is a 80 y.o. female who presents to the Emergency Department complaining of intermittent redness and swelling to the left lower leg for the past 1-2 weeks. She states sometimes the right leg swells up as well. She reports the left leg was even more swollen last night but has improved today. She lives alone. No fever, chills.   Patient lives independently.  PCP-Dr Renard Matter  Past Medical History  Diagnosis Date  . Osteoporosis   . High cholesterol   . Constipation   . Glaucoma   . Shortness of breath on exertion    Past Surgical History  Procedure Laterality Date  . Tonsillectomy    . Cesarean section    . Cataract extraction w/phaco Right 01/20/2013    Procedure: CATARACT EXTRACTION PHACO AND INTRAOCULAR LENS PLACEMENT (IOC);  Surgeon: Gemma Payor, MD;  Location: [facility];  Service: Ophthalmology;  Laterality: Right;  CDE:  18.27  . Eye surgery     History reviewed. No pertinent family history. History  Substance Use Topics  . Smoking status: Never Smoker   . Smokeless tobacco: Current User    Types: Snuff  . Alcohol Use: No   OB History   Grav Para Term Preterm Abortions TAB SAB Ect Mult Living                 Review of Systems  Unable to perform ROS: Dementia  Cardiovascular: Positive for leg swelling.   A complete 10 system review of systems was obtained and all systems are negative except as noted in the HPI and PMH.  Allergies  Review of patient's allergies indicates no known allergies.  Home Medications   Current Outpatient Rx  Name  Route  Sig  Dispense   Refill  . aspirin 81 MG chewable tablet   Oral   Chew 81 mg by mouth daily.           Marland Kitchen atorvastatin (LIPITOR) 10 MG tablet   Oral   Take 10 mg by mouth every morning.          . Calcium Citrate-Vitamin D (CITRACAL + D PO)   Oral   Take 1 tablet by mouth daily.         . carisoprodol (SOMA) 350 MG tablet   Oral   Take 350 mg by mouth 4 (four) times daily as needed. For sleep and muscle relaxant         . clonazePAM (KLONOPIN) 0.5 MG tablet   Oral   Take 0.5 mg by mouth 3 (three) times daily as needed. For anxiety          . feeding supplement (ENSURE) PUDG   Oral   Take 1 Container by mouth 3 (three) times daily with meals.   30 Can   3   . HYDROcodone-acetaminophen (VICODIN ES) 7.5-750 MG per tablet   Oral   Take 1 tablet by mouth every 4 (four) hours as needed. For pain         . metoCLOPramide (  REGLAN) 5 MG tablet   Oral   Take 5 mg by mouth daily as needed (Stomach Cramping).         . Travoprost, BAK Free, (TRAVATAN) 0.004 % SOLN ophthalmic solution   Both Eyes   Place 1 drop into both eyes at bedtime.          BP 109/91  Pulse 85  Temp(Src) 98.1 F (36.7 C) (Oral)  Resp 22  Ht 5\' 5"  (1.651 m)  Wt 145 lb (65.772 kg)  BMI 24.13 kg/m2  SpO2 100% Physical Exam  Nursing note and vitals reviewed. Constitutional: She is oriented to person, place, and time. She appears well-developed and well-nourished.  HENT:  Head: Normocephalic and atraumatic.  Eyes: Conjunctivae and EOM are normal. Pupils are equal, round, and reactive to light.  Neck: Normal range of motion. Neck supple.  Cardiovascular: Normal rate, regular rhythm and normal heart sounds.   From the knee distally the entire lower extremity is erythemmatous and edematous.   Pulmonary/Chest: Effort normal and breath sounds normal.  Abdominal: Soft. Bowel sounds are normal.  Musculoskeletal: Normal range of motion.  Neurological: She is alert and oriented to person, place, and time.   Skin: Skin is warm and dry.  Psychiatric: She has a normal mood and affect.    ED Course  Procedures (including critical care time) Medications - No data to display  DIAGNOSTIC STUDIES: Oxygen Saturation is 100% on RA, normal by my interpretation.    COORDINATION OF CARE: 7:42 PM- Discussed treatment plan with pt which includes blood work, IV antibiotics. Pt agrees to plan.    Labs Review Labs Reviewed  BASIC METABOLIC PANEL - Abnormal; Notable for the following:    Potassium 3.4 (*)    GFR calc non Af Amer 62 (*)    GFR calc Af Amer 72 (*)    All other components within normal limits  CBC WITH DIFFERENTIAL - Abnormal; Notable for the following:    Monocytes Relative 14 (*)    Monocytes Absolute 1.2 (*)    All other components within normal limits   Imaging Review No results found.  EKG Interpretation   None       MDM  No diagnosis found. History and physical consistent left lower extremity cellulitis. IV vancomycin. Admit to general medicine. Discussed with Dr. Felecia Shelling     I personally performed the services described in this documentation, which was scribed in my presence. The recorded information has been reviewed and is accurate.   Donnetta Hutching, MD 03/14/13 2203

## 2013-03-15 ENCOUNTER — Encounter (HOSPITAL_COMMUNITY): Payer: Self-pay | Admitting: *Deleted

## 2013-03-15 MED ORDER — HYDROCODONE-ACETAMINOPHEN 7.5-325 MG PO TABS
1.0000 | ORAL_TABLET | ORAL | Status: DC | PRN
  Administered 2013-03-15 – 2013-03-19 (×13): 1 via ORAL
  Filled 2013-03-15 (×13): qty 1

## 2013-03-15 MED ORDER — HYDROCODONE-ACETAMINOPHEN 7.5-325 MG PO TABS
1.0000 | ORAL_TABLET | Freq: Four times a day (QID) | ORAL | Status: DC | PRN
  Administered 2013-03-15 (×2): 1 via ORAL
  Filled 2013-03-15 (×2): qty 1

## 2013-03-15 NOTE — H&P (Signed)
Alexandra Riley, Alexandra Riley               ACCOUNT NO.:  [id number]  MEDICAL RECORD NO.:  [id number]  LOCATION:  A303                          FACILITY:  [facility]  PHYSICIAN:  Jalyn Rosero G. Renard Matter, MD   DATE OF BIRTH:  [birth year]  DATE OF ADMISSION:  03/14/2013 DATE OF DISCHARGE:  LH                             HISTORY & PHYSICAL   An 79 year old female.  The patient presented to the emergency department with the chief complaint being pain in her left leg. Apparently, this patient had swelling and pain in her left leg, some redness in the left leg for approximately 1-2 weeks, but it apparently becomes worse and more uncomfortable and she presented to the emergency department with this complaint and was evaluated.  She was noted to have cellulitis of the left lower extremity, was started on IV vancomycin and subsequently was admitted.  SOCIAL HISTORY:  The patient does not smoke or drink alcohol.  PAST MEDICAL HISTORY:  She has a history of osteoporosis, hyperlipidemia, glaucoma, bouts of constipation.  PAST SURGICAL HISTORY:  Prior tonsillectomy, C-section, cataract extraction, previous eye surgery.  FAMILY HISTORY:  Not available.  ALLERGIES:  No known allergies.  REVIEW OF SYSTEMS:  HEENT:  Negative.  CARDIOPULMONARY:  No cough, dyspnea or chest pain.  GI:  No bowel irregularity or bleeding.  GU:  No dysuria or hematuria.  PHYSICAL EXAMINATION:  GENERAL:  Alert patient complaining of pain in her left leg. VITAL SIGNS:  Blood pressure 144/66, respirations 24, pulse 85, temp 98.3. HEENT:  Eyes, PERRLA.  TMs negative.  Oropharynx benign. NECK:  Supple.  No JVD or thyroid abnormalities. HEART:  Regular rhythm.  No murmurs. LUNGS:  Clear to P and A. ABDOMEN:  No palpable organs or masses. SKIN:  The patient has inflammation and edema of the left lower leg.  ASSESSMENT:  The patient was admitted with what was felt to be cellulitis of the left lower leg.  MEDICATION LIST:  Aspirin 81 mg  daily, Lipitor 10 mg daily, Soma 350 mg daily, Klonopin 0.5 mg t.i.d., Ensure 1 container daily, Travatan 0.004% solution 1 drop in both eyes at bedtime, Reglan 5 mg daily as needed for stomach cramping.  PLAN:  To continue IV vancomycin and previous meds.     Shirley Bolle G. Renard Matter, MD     AGM/MEDQ  D:  03/15/2013  T:  03/15/2013  Job:  [id number]

## 2013-03-15 NOTE — Progress Notes (Signed)
NAMEKAMAREE, BERKEL               ACCOUNT NO.:  [id number]  MEDICAL RECORD NO.:  [id number]  LOCATION:  A303                          FACILITY:  [facility]  PHYSICIAN:  Arohi Salvatierra G. Renard Matter, MD   DATE OF BIRTH:  02-22-1922  DATE OF PROCEDURE: DATE OF DISCHARGE:                                PROGRESS NOTE   SUBJECTIVE:  This patient was admitted with cellulitis of the left leg. She is fairly comfortable this morning.  OBJECTIVE:  VITAL SIGNS:  Blood pressure 132/59, respirations 20, pulse 75, temp 97.6.  HEENT:  Eyes:  PERRLA.  TM negative.  Oropharynx benign. NECK:  Supple.  No JVD or thyroid abnormalities. HEART:  Regular rhythm.  No murmurs. LUNGS:  Clear to P and A. ABDOMEN:  No palpable organs or masses. EXTREMITIES:  The patient does have inflammation from the left knee distally over the entire extremity.  ASSESSMENT:  The patient admitted with cellulitis of the left lower leg. Plan to continue current IV antibiotics, IV vancomycin, and support measures.     Dayvon Dax G. Renard Matter, MD     AGM/MEDQ  D:  03/15/2013  T:  03/15/2013  Job:  [id number]

## 2013-03-16 NOTE — Progress Notes (Signed)
[name], [name]               ACCOUNT NO.:  [id number]  MEDICAL RECORD NO.:  [id number]  LOCATION:  A303                          FACILITY:  [facility]  PHYSICIAN:  Aaronjames Kelsay G. Renard Matter, MD   DATE OF BIRTH:  March 13, 1922  DATE OF PROCEDURE: DATE OF DISCHARGE:                                PROGRESS NOTE   SUBJECTIVE:  This patient was admitted with cellulitis of left leg and has been started on IV vancomycin.  She states that her leg is still painful and she does have a problem with constipation.  OBJECTIVE:  VITAL SIGNS:  Blood pressure 134/73, respirations 18, pulse 69, temp 97.4. HEENT:  Eyes PERRLA.  TM negative.  Oropharynx benign. NECK:  Supple.  No JVD or thyroid abnormalities. HEART:  Regular rhythm.  No murmurs. LUNGS:  Clear to P and A. ABDOMEN:  No palpable organs or masses. SKIN:  The patient has inflammation and edema of the left lower leg, which has improved some.  ASSESSMENT:  The patient was admitted with cellulitis, left lower leg.  PLAN:  To continue current IV vancomycin and previous medications.     Alexandra Riley G. Renard Matter, MD     AGM/MEDQ  D:  03/16/2013  T:  03/16/2013  Job:  [id number]

## 2013-03-17 ENCOUNTER — Inpatient Hospital Stay (HOSPITAL_COMMUNITY): Payer: Medicare Other

## 2013-03-17 LAB — BASIC METABOLIC PANEL
BUN: 8 mg/dL (ref 6–23)
Chloride: 109 mEq/L (ref 96–112)
GFR calc Af Amer: 87 mL/min — ABNORMAL LOW (ref >90)
GFR calc non Af Amer: 75 mL/min — ABNORMAL LOW (ref >90)
Glucose, Bld: 96 mg/dL (ref 70–99)
Potassium: 3.5 mEq/L (ref 3.5–5.1)
Sodium: 144 mEq/L (ref 135–145)

## 2013-03-17 NOTE — Progress Notes (Signed)
NAMESKYANNE, WELLE               ACCOUNT NO.:  [id number]  MEDICAL RECORD NO.:  [id number]  LOCATION:  A303                          FACILITY:  [facility]  PHYSICIAN:  Traci Gafford G. Renard Matter, MD   DATE OF BIRTH:  16-Aug-1921  DATE OF PROCEDURE: DATE OF DISCHARGE:                                PROGRESS NOTE   SUBJECTIVE:  This patient was admitted with cellulitis on left leg.  She remains on IV vancomycin.  The inflammation in the left leg has improved.  OBJECTIVE:  VITAL SIGNS:  Blood pressure 149/55, respirations 18, pulse 71, temp 97.6. HEENT:  Eyes PERRLA.  TMs negative.  Oropharynx benign. NECK:  Supple.  No JVD or thyroid abnormalities. HEART:  Regular rhythm.  No murmurs. LUNGS:  Clear to P and A. ABDOMEN:  No palpable organs or masses. SKIN:  Inflammation and minimal edema on left lower leg, which continues to improve.  ASSESSMENT:  The patient was admitted with cellulitis of left lower leg.  PLAN:  To continue current IV vancomycin and previous medications.  We will obtain venous Doppler ultrasound of left leg.     Eyleen Rawlinson G. Renard Matter, MD     AGM/MEDQ  D:  03/17/2013  T:  03/17/2013  Job:  [id number]

## 2013-03-17 NOTE — Progress Notes (Signed)
UR chart review completed.  

## 2013-03-17 NOTE — Care Management Note (Addendum)
    Page 1 of 2   03/19/2013     9:30:22 AM   CARE MANAGEMENT NOTE 03/19/2013  Patient:  Alexandra Riley, Alexandra Riley   Account Number:  [id number]  Date Initiated:  03/17/2013  Documentation initiated by:  Sharrie Rothman  Subjective/Objective Assessment:   Pt admitted from home with cellulitis of left leg. Pt lives alone but has a son and daughter who help with transportation to MD appts. Pt has a walker for home use and has used [facility] in the past. Pt is able to do most ADL's.     Action/Plan:   Pt is agreeable to [facility] RN, PT, aide at discharge. CM will arrange at discharge.   Anticipated DC Date:  03/19/2013   Anticipated DC Plan:  HOME W HOME HEALTH SERVICES      DC Planning Services  CM consult      [facility] Choice  HOME HEALTH   Choice offered to / List presented to:  C-1 Patient        HH arranged  HH-1 RN  HH-2 PT  HH-4 NURSE'S AIDE      HH agency  [facility]   Status of service:  Completed, signed off Medicare Important Message given?  YES (If response is "NO", the following Medicare IM given date fields will be blank) Date Medicare IM given:  03/19/2013 Date Additional Medicare IM given:    Discharge Disposition:  HOME W HOME HEALTH SERVICES  Per UR Regulation:    If discussed at Long Length of Stay Meetings, dates discussed:    Comments:  03/19/13 0930 Arlyss Queen, RN BSN CM Pt discharged home today with [facility] HH. Alroy Bailiff of [facility] is aware and will collect the pts information from the chart. HH services to start within 48 hours of discharge. No DME needs noted. Pt and pts nurse aware of discharge arrangements.  03/17/13 1300 Arlyss Queen, RN BSN CM

## 2013-03-18 MED ORDER — FLEET ENEMA 7-19 GM/118ML RE ENEM
1.0000 | ENEMA | Freq: Once | RECTAL | Status: AC
  Administered 2013-03-18: 1 via RECTAL

## 2013-03-18 NOTE — Progress Notes (Signed)
Alexandra Riley, Alexandra Riley               ACCOUNT NO.:  [id number]  MEDICAL RECORD NO.:  [id number]  LOCATION:  A303                          FACILITY:  [facility]  PHYSICIAN:  Hudsyn Barich G. Vandora Jaskulski, MD   DATE OF BIRTH:  March 10, 1922  DATE OF PROCEDURE: DATE OF DISCHARGE:                                PROGRESS NOTE   SUBJECTIVE:  This patient has cellulitis of her left leg and remains on IV vancomycin, inflammation, and continues to improve in left leg.  OBJECTIVE:  VITAL SIGNS:  Blood pressure 120/58, respirations 16, pulse 72, temp 98.1. HEENT:  Eyes PERRLA.  TM negative.  Oropharynx benign. NECK:  Supple.  No JVD or thyroid abnormalities. HEART:  Regular rhythm.  No murmurs. LUNGS:  Clear to P and A. ABDOMEN:  No palpable organs or masses. SKIN:  Inflammation and minimal edema of the left lower leg, which continues to improve.  ASSESSMENT:  The patient was admitted with cellulitis of the left lower leg.  Plan to continue current IV vancomycin and previous medications. Venous Doppler ultrasound was negative for thrombus.     Pervis Macintyre G. Renard Matter, MD     AGM/MEDQ  D:  03/18/2013  T:  03/18/2013  Job:  [id number]

## 2013-03-19 LAB — BASIC METABOLIC PANEL
CO2: 27 mEq/L (ref 19–32)
Chloride: 109 mEq/L (ref 96–112)
GFR calc Af Amer: 86 mL/min — ABNORMAL LOW (ref >90)
Potassium: 3.7 mEq/L (ref 3.5–5.1)
Sodium: 145 mEq/L (ref 135–145)

## 2013-03-19 MED ORDER — POTASSIUM CHLORIDE 10 MEQ/100ML IV SOLN: 10.0000 meq | INTRAVENOUS | Status: DC

## 2013-03-19 NOTE — Progress Notes (Signed)
Patient received discharge instructions along with follow up appointments and instructions. Patient verbalized understanding of all instructions. Patient was escorted via wheelchair to vehicle. Patient discharged to home in stable condition.

## 2013-03-19 NOTE — Discharge Summary (Signed)
NAMEENID, MAULTSBY               ACCOUNT NO.:  [id number]  MEDICAL RECORD NO.:  [id number]  LOCATION:  A303                          FACILITY:  [facility]  PHYSICIAN:  Marissa Lowrey G. Renard Matter, MD   DATE OF BIRTH:  Jan 14, 1922  DATE OF ADMISSION:  03/14/2013 DATE OF DISCHARGE:  11/19/2014LH                              DISCHARGE SUMMARY   DIAGNOSES: 1. Cellulitis of left lower leg. 2. Osteoporosis.  CONDITION:  Stable.  This patient presented to the emergency department with a chief complaint being pain and swelling in the left leg.  She had some redness in her left leg and had this for approximately 1-2 weeks prior to her entry into the hospital.  She was seen in the emergency department and evaluated.  Cellulitis was noted in the left lower leg.  She was started on IV vancomycin and subsequently was admitted.  PHYSICAL EXAMINATION:  GENERAL:  On admission, alert.  The patient was complaining of pain in the left leg. VITAL SIGNS:  Blood pressure 144/66, respirations 24, pulse 85, temperature 98.3.  HEENT:  Eyes, PERRLA.  TM negative.  Oropharynx benign. NECK:  Supple.  No JVD or thyroid abnormalities. HEART:  Regular rhythm.  No murmurs. LUNGS:  Clear to P and A. ABDOMEN:  No palpable organs or masses. SKIN:  The patient has inflammation and edema of the left lower leg.  ASSESSMENT:  The patient was admitted with what was felt to be cellulitis of left lower leg.  LABORATORY DATA:  CBC on admission, WBC 8500 with hemoglobin 12.2, hematocrit 38.1.  Basic metabolic panel on admission:  Sodium 141, potassium 3.4, chloride 103, CO2 of 25, glucose 98, BUN 12, creatinine 0.82.  Subsequent basic metabolic panel on March 19, 2013, sodium 145, potassium 3.7, chloride 109, CO2 of 27, glucose 96, BUN 10, creatinine 0.72.  RADIOLOGY:  The patient had a venous Doppler ultrasound done of the legs, was negative for acute DVT.  MEDICATIONS:  The patient was continued on the following  medications: 1. Aspirin 81 mg daily. 2. Lipitor 10 mg daily. 3. Soma 350 mg q.8 hours. 4. She was given IV vancomycin 1250 mg every 12 hours. 5. She had saline 0.9%, 250 mL. 6. She was continued on Klonopin 0.5 mg t.i.d. 7. She was given hydrocodone/acetaminophen 7.5/325 mg every 4 hours as     needed for pain.     Ramez Arrona G. Renard Matter, MD     AGM/MEDQ  D:  03/19/2013  T:  03/19/2013  Job:  [id number]

## 2013-03-19 NOTE — Discharge Summary (Signed)
Alexandra Riley, Alexandra Riley               ACCOUNT NO.:  [id number]  MEDICAL RECORD NO.:  [id number]  LOCATION:  A303                          FACILITY:  [facility]  PHYSICIAN:  Valta Dillon G. Renard Matter, MD   DATE OF BIRTH:  Sep 17, 1921  DATE OF ADMISSION:  03/14/2013 DATE OF DISCHARGE:  LH                              DISCHARGE SUMMARY   The patient will be taking the following medications at the time of discharge. 1. Soma 350 mg t.i.d. 2. Lipitor 10 mg daily. 3. Aspirin 81 mg daily. 4. Klonopin 0.5 mg t.i.d. 5. Citracal plus D p.o. 1 tablet daily. 6. Ensure 1 container 3 times daily with meals. 7. Zofran 4 mg 3 times daily as needed. 8. Norco 7.5/325, 1 tablet every 4 hours as needed for pain.  The patient will be seen and followed by home health care.  She will no longer be taking vancomycin, but we will prescribe Keflex 500 mg b.i.d. plus Biaxin 1 b.i.d.     Floyce Bujak G. Renard Matter, MD     AGM/MEDQ  D:  03/19/2013  T:  03/19/2013  Job:  [id number]

## 2013-03-20 NOTE — Discharge Summary (Signed)
Alexandra Riley, Alexandra Riley               ACCOUNT NO.:  [id number]  MEDICAL RECORD NO.:  [id number]  LOCATION:  A303                          FACILITY:  [facility]  PHYSICIAN:  Fallon Howerter G. Renard Matter, MD   DATE OF BIRTH:  1921-11-06  DATE OF ADMISSION:  03/14/2013 DATE OF DISCHARGE:  11/19/2014LH                              DISCHARGE SUMMARY   ADDENDUM:  This is a correction in the antibiotics prescriptions, which the patient is to take and pickup at [facility].  The correct antibiotics that she is to take is Septra DS 1 b.i.d. for 10 days and amoxicillin 875 mg 1 b.i.d. for 10 days.     Demtrius Rounds G. Renard Matter, MD     AGM/MEDQ  D:  03/19/2013  T:  03/20/2013  Job:  [id number]

## 2013-03-24 ENCOUNTER — Other Ambulatory Visit (HOSPITAL_COMMUNITY): Payer: Self-pay | Admitting: Family Medicine

## 2013-03-24 ENCOUNTER — Ambulatory Visit (HOSPITAL_COMMUNITY)
Admission: RE | Admit: 2013-03-24 | Discharge: 2013-03-24 | Disposition: A | Discharge status: Home / Self Care | Payer: Medicare Other | Source: Ambulatory Visit | Attending: Family Medicine | Admitting: Family Medicine

## 2013-03-24 DIAGNOSIS — L819 Disorder of pigmentation, unspecified: Secondary | ICD-10-CM

## 2013-05-16 ENCOUNTER — Ambulatory Visit (HOSPITAL_COMMUNITY)
Admission: RE | Admit: 2013-05-16 | Discharge: 2013-05-16 | Disposition: A | Discharge status: Home / Self Care | Payer: Medicare Other | Source: Ambulatory Visit | Attending: Family Medicine | Admitting: Family Medicine

## 2013-05-16 ENCOUNTER — Other Ambulatory Visit (HOSPITAL_COMMUNITY): Payer: Self-pay | Admitting: Family Medicine

## 2013-05-16 DIAGNOSIS — M8448XA Pathological fracture, other site, initial encounter for fracture: Secondary | ICD-10-CM | POA: Insufficient documentation

## 2013-05-16 DIAGNOSIS — R52 Pain, unspecified: Secondary | ICD-10-CM

## 2013-05-16 DIAGNOSIS — M51379 Other intervertebral disc degeneration, lumbosacral region without mention of lumbar back pain or lower extremity pain: Secondary | ICD-10-CM | POA: Insufficient documentation

## 2013-05-16 DIAGNOSIS — M5137 Other intervertebral disc degeneration, lumbosacral region: Secondary | ICD-10-CM | POA: Insufficient documentation

## 2013-05-16 DIAGNOSIS — M25519 Pain in unspecified shoulder: Secondary | ICD-10-CM | POA: Insufficient documentation

## 2013-07-14 ENCOUNTER — Ambulatory Visit (HOSPITAL_COMMUNITY)
Admission: RE | Admit: 2013-07-14 | Discharge: 2013-07-14 | Disposition: A | Discharge status: Home / Self Care | Payer: Medicare Other | Source: Ambulatory Visit | Attending: Family Medicine | Admitting: Family Medicine

## 2013-07-14 ENCOUNTER — Other Ambulatory Visit (HOSPITAL_COMMUNITY): Payer: Self-pay | Admitting: Family Medicine

## 2013-07-14 DIAGNOSIS — M25512 Pain in left shoulder: Secondary | ICD-10-CM

## 2013-07-14 DIAGNOSIS — M79602 Pain in left arm: Secondary | ICD-10-CM

## 2013-07-14 DIAGNOSIS — M25519 Pain in unspecified shoulder: Secondary | ICD-10-CM | POA: Insufficient documentation

## 2013-07-14 DIAGNOSIS — M5030 Other cervical disc degeneration, unspecified cervical region: Secondary | ICD-10-CM | POA: Insufficient documentation

## 2013-07-14 DIAGNOSIS — M47812 Spondylosis without myelopathy or radiculopathy, cervical region: Secondary | ICD-10-CM | POA: Insufficient documentation

## 2013-07-14 DIAGNOSIS — M542 Cervicalgia: Secondary | ICD-10-CM | POA: Insufficient documentation

## 2013-09-19 ENCOUNTER — Emergency Department (HOSPITAL_COMMUNITY): Payer: Medicare Other

## 2013-09-19 ENCOUNTER — Encounter (HOSPITAL_COMMUNITY): Payer: Self-pay | Admitting: Emergency Medicine

## 2013-09-19 ENCOUNTER — Emergency Department (HOSPITAL_COMMUNITY)
Admission: EM | Admit: 2013-09-19 | Discharge: 2013-09-19 | Disposition: A | Discharge status: Home / Self Care | Payer: Medicare Other | Attending: Emergency Medicine | Admitting: Emergency Medicine

## 2013-09-19 DIAGNOSIS — Z7982 Long term (current) use of aspirin: Secondary | ICD-10-CM | POA: Insufficient documentation

## 2013-09-19 DIAGNOSIS — S62509A Fracture of unspecified phalanx of unspecified thumb, initial encounter for closed fracture: Secondary | ICD-10-CM

## 2013-09-19 DIAGNOSIS — S0990XA Unspecified injury of head, initial encounter: Secondary | ICD-10-CM | POA: Insufficient documentation

## 2013-09-19 DIAGNOSIS — S62639A Displaced fracture of distal phalanx of unspecified finger, initial encounter for closed fracture: Secondary | ICD-10-CM | POA: Insufficient documentation

## 2013-09-19 DIAGNOSIS — Z79899 Other long term (current) drug therapy: Secondary | ICD-10-CM | POA: Insufficient documentation

## 2013-09-19 DIAGNOSIS — W06XXXA Fall from bed, initial encounter: Secondary | ICD-10-CM | POA: Insufficient documentation

## 2013-09-19 DIAGNOSIS — E7800 Pure hypercholesterolemia, unspecified: Secondary | ICD-10-CM | POA: Insufficient documentation

## 2013-09-19 DIAGNOSIS — Y9389 Activity, other specified: Secondary | ICD-10-CM | POA: Insufficient documentation

## 2013-09-19 DIAGNOSIS — G8929 Other chronic pain: Secondary | ICD-10-CM | POA: Insufficient documentation

## 2013-09-19 DIAGNOSIS — Z8669 Personal history of other diseases of the nervous system and sense organs: Secondary | ICD-10-CM | POA: Insufficient documentation

## 2013-09-19 DIAGNOSIS — Z8739 Personal history of other diseases of the musculoskeletal system and connective tissue: Secondary | ICD-10-CM | POA: Insufficient documentation

## 2013-09-19 DIAGNOSIS — W19XXXA Unspecified fall, initial encounter: Secondary | ICD-10-CM

## 2013-09-19 DIAGNOSIS — IMO0002 Reserved for concepts with insufficient information to code with codable children: Secondary | ICD-10-CM | POA: Insufficient documentation

## 2013-09-19 DIAGNOSIS — Y929 Unspecified place or not applicable: Secondary | ICD-10-CM | POA: Insufficient documentation

## 2013-09-19 LAB — CBC WITH DIFFERENTIAL/PLATELET
BASOS ABS: 0 10*3/uL (ref 0.0–0.1)
Basophils Relative: 0 % (ref 0–1)
EOS ABS: 0.1 10*3/uL (ref 0.0–0.7)
Eosinophils Relative: 1 % (ref 0–5)
HCT: 39.4 % (ref 36.0–46.0)
Hemoglobin: 12.6 g/dL (ref 12.0–15.0)
Lymphocytes Relative: 14 % (ref 12–46)
Lymphs Abs: 1 10*3/uL (ref 0.7–4.0)
MCH: 29 pg (ref 26.0–34.0)
MCHC: 32 g/dL (ref 30.0–36.0)
MCV: 90.8 fL (ref 78.0–100.0)
Monocytes Absolute: 0.7 10*3/uL (ref 0.1–1.0)
Monocytes Relative: 11 % (ref 3–12)
NEUTROS PCT: 74 % (ref 43–77)
Neutro Abs: 5.2 10*3/uL (ref 1.7–7.7)
PLATELETS: 190 10*3/uL (ref 150–400)
RBC: 4.34 MIL/uL (ref 3.87–5.11)
RDW: 14.6 % (ref 11.5–15.5)
WBC: 6.9 10*3/uL (ref 4.0–10.5)

## 2013-09-19 LAB — TROPONIN I

## 2013-09-19 LAB — URINALYSIS, ROUTINE W REFLEX MICROSCOPIC
Bilirubin Urine: NEGATIVE
Glucose, UA: NEGATIVE mg/dL
KETONES UR: NEGATIVE mg/dL
NITRITE: NEGATIVE
PROTEIN: NEGATIVE mg/dL
Specific Gravity, Urine: 1.01 (ref 1.005–1.030)
Urobilinogen, UA: 0.2 mg/dL (ref 0.0–1.0)
pH: 5.5 (ref 5.0–8.0)

## 2013-09-19 LAB — COMPREHENSIVE METABOLIC PANEL
ALK PHOS: 81 U/L (ref 39–117)
ALT: 18 U/L (ref 0–35)
AST: 21 U/L (ref 0–37)
Albumin: 3.8 g/dL (ref 3.5–5.2)
BUN: 13 mg/dL (ref 6–23)
CO2: 25 mEq/L (ref 19–32)
Calcium: 9.6 mg/dL (ref 8.4–10.5)
Chloride: 107 mEq/L (ref 96–112)
Creatinine, Ser: 0.89 mg/dL (ref 0.50–1.10)
GFR calc Af Amer: 65 mL/min — ABNORMAL LOW (ref >90)
GFR calc non Af Amer: 56 mL/min — ABNORMAL LOW (ref >90)
Glucose, Bld: 110 mg/dL — ABNORMAL HIGH (ref 70–99)
POTASSIUM: 4.4 meq/L (ref 3.7–5.3)
SODIUM: 142 meq/L (ref 137–147)
TOTAL PROTEIN: 6.8 g/dL (ref 6.0–8.3)
Total Bilirubin: 0.4 mg/dL (ref 0.3–1.2)

## 2013-09-19 LAB — URINE MICROSCOPIC-ADD ON

## 2013-09-19 MED ORDER — TRAMADOL HCL 50 MG PO TABS
50.0000 mg | ORAL_TABLET | Freq: Once | ORAL | Status: AC
  Administered 2013-09-19: 50 mg via ORAL
  Filled 2013-09-19: qty 1

## 2013-09-19 MED ORDER — ONDANSETRON 4 MG PO TBDP
ORAL_TABLET | ORAL | Status: AC
  Filled 2013-09-19: qty 1

## 2013-09-19 MED ORDER — ONDANSETRON 4 MG PO TBDP
4.0000 mg | ORAL_TABLET | Freq: Once | ORAL | Status: AC
  Administered 2013-09-19: 4 mg via ORAL

## 2013-09-19 NOTE — Discharge Instructions (Signed)
Finger Fracture Fractures of fingers are breaks in the bones of the fingers. There are many types of fractures. There are different ways of treating these fractures. Your health care provider will discuss the best way to treat your fracture. CAUSES Traumatic injury is the main cause of broken fingers. These include:  Injuries while playing sports.  Workplace injuries.  Falls. RISK FACTORS Activities that can increase your risk of finger fractures include:  Sports.  Workplace activities that involve machinery.  A condition called osteoporosis, which can make your bones less dense and cause them to fracture more easily. SIGNS AND SYMPTOMS The main symptoms of a broken finger are pain and swelling within 15 minutes after the injury. Other symptoms include:  Bruising of your finger.  Stiffness of your finger.  Numbness of your finger.  Exposed bones (compound fracture) if the fracture is severe. DIAGNOSIS  The best way to diagnose a broken bone is with X-ray imaging. Additionally, your health care provider will use this X-ray image to evaluate the position of the broken finger bones.  TREATMENT  Finger fractures can be treated with:   Nonreduction This means the bones are in place. The finger is splinted without changing the positions of the bone pieces. The splint is usually left on for about a week to 10 days. This will depend on your fracture and what your health care provider thinks.  Closed reduction The bones are put back into position without using surgery. The finger is then splinted.  Open reduction and internal fixation The fracture site is opened. Then the bone pieces are fixed into place with pins or some type of hardware. This is seldom required. It depends on the severity of the fracture. HOME CARE INSTRUCTIONS   Follow your health care provider's instructions regarding activities, exercises, and physical therapy.  Only take over-the-counter or prescription  medicines for pain, discomfort, or fever as directed by your health care provider. SEEK MEDICAL CARE IF: You have pain or swelling that limits the motion or use of your fingers. SEEK IMMEDIATE MEDICAL CARE IF:  Your finger becomes numb. MAKE SURE YOU:   Understand these instructions.  Will watch your condition.  Will get help right away if you are not doing well or get worse. Document Released: 07/30/2000 Document Revised: 02/05/2013 Document Reviewed: 11/27/2012 [facility] Patient Information 2014 [place], [place].  Head Injury, Adult You have received a head injury. It does not appear serious at this time. Headaches and vomiting are common following head injury. It should be easy to awaken from sleeping. Sometimes it is necessary for you to stay in the emergency department for a while for observation. Sometimes admission to the hospital may be needed. After injuries such as yours, most problems occur within the first 24 hours, but side effects may occur up to 7 10 days after the injury. It is important for you to carefully monitor your condition and contact your health care provider or seek immediate medical care if there is a change in your condition. WHAT ARE THE TYPES OF HEAD INJURIES? Head injuries can be as minor as a bump. Some head injuries can be more severe. More severe head injuries include:  A jarring injury to the brain (concussion).  A bruise of the brain (contusion). This mean there is bleeding in the brain that can cause swelling.  A cracked skull (skull fracture).  Bleeding in the brain that collects, clots, and forms a bump (hematoma). WHAT CAUSES A HEAD INJURY? A serious head injury  is most likely to happen to someone who is in a car wreck and is not wearing a seat belt. Other causes of major head injuries include bicycle or motorcycle accidents, sports injuries, and falls. HOW ARE HEAD INJURIES DIAGNOSED? A complete history of the event leading to the injury and your  current symptoms will be helpful in diagnosing head injuries. Many times, pictures of the brain, such as CT or MRI are needed to see the extent of the injury. Often, an overnight hospital stay is necessary for observation.  WHEN SHOULD I SEEK IMMEDIATE MEDICAL CARE?  You should get help right away if:  You have confusion or drowsiness.  You feel sick to your stomach (nauseous) or have continued, forceful vomiting.  You have dizziness or unsteadiness that is getting worse.  You have severe, continued headaches not relieved by medicine. Only take over-the-counter or prescription medicines for pain, fever, or discomfort as directed by your health care provider.  You do not have normal function of the arms or legs or are unable to walk.  You notice changes in the black spots in the center of the colored part of your eye (pupil).  You have a clear or bloody fluid coming from your nose or ears.  You have a loss of vision. During the next 24 hours after the injury, you must stay with someone who can watch you for the warning signs. This person should contact local emergency services (911 in the [place] if you have seizures, you become unconscious, or you are unable to wake up. HOW CAN I PREVENT A HEAD INJURY IN THE FUTURE? The most important factor for preventing major head injuries is avoiding motor vehicle accidents. To minimize the potential for damage to your head, it is crucial to wear seat belts while riding in motor vehicles. Wearing helmets while bike riding and playing collision sports (like football) is also helpful. Also, avoiding dangerous activities around the house will further help reduce your risk of head injury.  WHEN CAN I RETURN TO NORMAL ACTIVITIES AND ATHLETICS? You should be reevaluated by your health care provider before returning to these activities. If you have any of the following symptoms, you should not return to activities or contact sports until 1 week after the symptoms  have stopped:  Persistent headache.  Dizziness or vertigo.  Poor attention and concentration.  Confusion.  Memory problems.  Nausea or vomiting.  Fatigue or tire easily.  Irritability.  Intolerant of bright lights or loud noises.  Anxiety or depression.  Disturbed sleep. MAKE SURE YOU:   Understand these instructions.  Will watch your condition.  Will get help right away if you are not doing well or get worse. Document Released: 04/17/2005 Document Revised: 02/05/2013 Document Reviewed: 12/23/2012 [facility] Patient Information 2014 [place], [place]

## 2013-09-19 NOTE — ED Notes (Addendum)
When going to bed last night around 2300, she fell at her bedside, but doesn't know how she fell.  Does not know if she tripped or got dizzy.  This morning she has generalized 10/10 headache w/associated nausea.  She has had 2 falls within a month.

## 2013-09-19 NOTE — ED Notes (Signed)
Pt complaining of nausea, MD aware orders given

## 2013-09-19 NOTE — ED Provider Notes (Addendum)
CSN: [id number]     Arrival date & time 09/19/13  1137 History   First MD Initiated Contact with Patient 09/19/13 1219    This chart was scribed for [employer]. Rubin Payor, MD by Marica Otter, ED Scribe. This patient was seen in room [facility]11/[facility]11 and the patient's care was started at 12:32 PM.  Chief Complaint  Patient presents with  . Fall   The history is provided by the patient. No language interpreter was used.   HPI Comments: Alexandra Riley is a 80 y.o. female, with a history of high cholesterol and glaucoma, who presents to the Emergency Department complaining of a fall by her bedside around 11:00pm last night. Pt reports she does not know how she fell, however, recalls being on the floor immediately following her fall. Pt complains of associated HA, back pain, bruising of left thumb, and bruising of the head. Pt notes, however, that she has chronic back pain. Pt reports 2 falls within the past month.   Past Medical History  Diagnosis Date  . Osteoporosis   . High cholesterol   . Constipation   . Glaucoma   . Shortness of breath on exertion    Past Surgical History  Procedure Laterality Date  . Tonsillectomy    . Cesarean section    . Cataract extraction w/phaco Right 01/20/2013    Procedure: CATARACT EXTRACTION PHACO AND INTRAOCULAR LENS PLACEMENT (IOC);  Surgeon: Gemma Payor, MD;  Location: [facility];  Service: Ophthalmology;  Laterality: Right;  CDE:  18.27  . Eye surgery     History reviewed. No pertinent family history. History  Substance Use Topics  . Smoking status: Never Smoker   . Smokeless tobacco: Current User    Types: Snuff  . Alcohol Use: No   OB History   Grav Para Term Preterm Abortions TAB SAB Ect Mult Living   4 3 3       3      Review of Systems  Musculoskeletal: Positive for back pain.  Skin: Positive for wound (bruising of the scalp and left thumb).  Neurological: Positive for headaches.  All other systems reviewed and are negative.     Allergies   Review of patient's allergies indicates no known allergies.  Home Medications   Prior to Admission medications   Medication Sig Start Date End Date Taking? Authorizing Provider  aspirin 81 MG chewable tablet Chew 81 mg by mouth daily.     Yes Historical Provider, MD  atorvastatin (LIPITOR) 10 MG tablet Take 10 mg by mouth every morning.    Yes Historical Provider, MD  carisoprodol (SOMA) 350 MG tablet Take 350 mg by mouth 3 (three) times daily. For sleep and muscle relaxant   Yes Historical Provider, MD  clonazePAM (KLONOPIN) 0.5 MG tablet Take 0.5 mg by mouth 3 (three) times daily as needed. For anxiety    Yes Historical Provider, MD  HYDROcodone-acetaminophen (NORCO) 7.5-325 MG per tablet Take 1 tablet by mouth every 4 (four) hours.   Yes Historical Provider, MD   Triage Vitals: BP 147/64  Pulse 65  Temp(Src) 98.2 F (36.8 C) (Oral)  Resp 16  Ht 5\' 4"  (1.626 m)  Wt 145 lb (65.772 kg)  BMI 24.88 kg/m2  SpO2 100% Physical Exam  Nursing note and vitals reviewed. Constitutional: She is oriented to person, place, and time. She appears well-developed and well-nourished. No distress.  HENT:  Head: Normocephalic and atraumatic.  Hematoma and ecchymosis of left forehead.  Teeth are aligned  Eyes: EOM are normal. No scleral icterus.  Neck: Neck supple. No tracheal deviation present.  NOM of cervical spine  Cardiovascular: Normal rate.   Pulmonary/Chest: Effort normal. No respiratory distress.  Abdominal: There is no tenderness.  No CVA tenderness  Musculoskeletal: Normal range of motion. She exhibits tenderness.  Chest non tender  Ecchymosis of left thumb with pain to palpation Thoracic spine tenderness   Neurological: She is alert and oriented to person, place, and time.  Skin: Skin is warm and dry.  Bruising of right lateral orbital and left thumb.   Psychiatric: She has a normal mood and affect. Her behavior is normal.    ED Course  Procedures (including critical care  time) DIAGNOSTIC STUDIES: Oxygen Saturation is 100% on RA, normal by my interpretation.    COORDINATION OF CARE:  12:39 PM-Discussed treatment plan which includes imaging with pt at bedside and pt agreed to plan.   Labs Review Labs Reviewed  COMPREHENSIVE METABOLIC PANEL - Abnormal; Notable for the following:    Glucose, Bld 110 (*)    GFR calc non Af Amer 56 (*)    GFR calc Af Amer 65 (*)    All other components within normal limits  URINALYSIS, ROUTINE W REFLEX MICROSCOPIC - Abnormal; Notable for the following:    Hgb urine dipstick SMALL (*)    Leukocytes, UA SMALL (*)    All other components within normal limits  CBC WITH DIFFERENTIAL  TROPONIN I  URINE MICROSCOPIC-ADD ON    Imaging Review Dg Chest 2 View  09/19/2013   CLINICAL DATA:  Syncope.  EXAM: CHEST  2 VIEW  COMPARISON:  Sep 24, 2010.  FINDINGS: The heart size and mediastinal contours are within normal limits. Both lungs are clear. Hyperexpansion of the lungs is noted. No pneumothorax or pleural effusion is noted. Stable severe compression deformity of mid thoracic spine is noted consistent with old fracture.  IMPRESSION: No acute cardiopulmonary abnormality seen.   Electronically Signed   By: Roque Lias M.D.   On: 09/19/2013 14:14   Ct Head Wo Contrast  09/19/2013   CLINICAL DATA:  Larey Seat last night going to bed, headache, nausea  EXAM: CT HEAD WITHOUT CONTRAST  CT MAXILLOFACIAL WITHOUT CONTRAST  TECHNIQUE: Multidetector CT imaging of the head and maxillofacial structures were performed using the standard protocol without intravenous contrast. Multiplanar CT image reconstructions of the maxillofacial structures were also generated.  COMPARISON:  09/24/2010 CT head  FINDINGS: CT HEAD FINDINGS  Generalized atrophy.  Normal ventricular morphology.  No midline shift or mass effect.  Small vessel chronic ischemic changes of deep cerebral white matter.  No intracranial hemorrhage, mass lesion, or acute infarction.  Visualized  paranasal sinuses and mastoid air cells clear.  Bones demineralized.  CT MAXILLOFACIAL FINDINGS  Soft tissues unremarkable.  Diffuse osseous demineralization.  Paranasal sinuses, mastoid air cells, and middle ear cavities clear.  No acute facial bone abnormalities identified.  IMPRESSION: Atrophy with small vessel chronic ischemic changes in deep cerebral white matter.  No acute intracranial abnormalities.  No acute facial bone abnormalities.   Electronically Signed   By: Ulyses Southward M.D.   On: 09/19/2013 14:36   Dg Finger Thumb Left  09/19/2013   CLINICAL DATA:  LEFT thumb pain and bruising, fell last night, syncope  EXAM: LEFT THUMB 2+V  COMPARISON:  None  FINDINGS: Osseous demineralization.  Advanced degenerative changes at IP joint thumb with joint space narrowing and spur formation.  Slight radial subluxation at the IP joint.  Small calcific densities, slightly irregular in appearance, are seen at the volar aspect of the base of the distal phalanx, question related to spur formation or adjacent soft tissue calcification/debris though it is difficult to completely exclude fracture.  No additional fracture or dislocation.  IMPRESSION: Advanced degenerative changes of the IP joint LEFT thumb though it is difficult to completely exclude subtle fracture at the volar aspect of the base of the distal phalanx as discussed above.   Electronically Signed   By: Ulyses SouthwardMark  Boles M.D.   On: 09/19/2013 14:23   Ct Maxillofacial Wo Cm  09/19/2013   CLINICAL DATA:  Larey SeatFell last night going to bed, headache, nausea  EXAM: CT HEAD WITHOUT CONTRAST  CT MAXILLOFACIAL WITHOUT CONTRAST  TECHNIQUE: Multidetector CT imaging of the head and maxillofacial structures were performed using the standard protocol without intravenous contrast. Multiplanar CT image reconstructions of the maxillofacial structures were also generated.  COMPARISON:  09/24/2010 CT head  FINDINGS: CT HEAD FINDINGS  Generalized atrophy.  Normal ventricular morphology.   No midline shift or mass effect.  Small vessel chronic ischemic changes of deep cerebral white matter.  No intracranial hemorrhage, mass lesion, or acute infarction.  Visualized paranasal sinuses and mastoid air cells clear.  Bones demineralized.  CT MAXILLOFACIAL FINDINGS  Soft tissues unremarkable.  Diffuse osseous demineralization.  Paranasal sinuses, mastoid air cells, and middle ear cavities clear.  No acute facial bone abnormalities identified.  IMPRESSION: Atrophy with small vessel chronic ischemic changes in deep cerebral white matter.  No acute intracranial abnormalities.  No acute facial bone abnormalities.   Electronically Signed   By: Ulyses SouthwardMark  Boles M.D.   On: 09/19/2013 14:36     EKG Interpretation None      MDM   Final diagnoses:  Fall  Minor head injury  Fracture of thumb, left, closed    Patient with fall. Likely mechanical since she was somewhat unsteady. Cardiac cause felt less likely. CT is reassuring. X-ray shows possible thumb fracture on the distal phalanx and will be splinted for followup. Will discharge home  I personally performed the services described in this documentation, which was scribed in my presence. The recorded information has been reviewed and is accurate.     Juliet RudeNathan R. Rubin PayorPickering, MD 09/19/13 1534  Juliet RudeNathan R. Rubin PayorPickering, MD 09/30/13 1327

## 2013-09-19 NOTE — ED Notes (Signed)
Pt c/o headache and pain to left thumb after fall that occurred last night. Pt presents with bruising to right side of face, forehead and left thumb. Pt is unsure what caused her to fall and is unsure of LOC. Pt is A&Ox4 on arrival to ED.

## 2013-09-19 NOTE — ED Notes (Signed)
Patient given discharge instruction, verbalized understand. Patient ambulatory out of the department.  

## 2014-03-02 ENCOUNTER — Encounter (HOSPITAL_COMMUNITY): Payer: Self-pay | Admitting: Emergency Medicine

## 2014-05-26 ENCOUNTER — Inpatient Hospital Stay (HOSPITAL_COMMUNITY)
Admission: EM | Admit: 2014-05-26 | Discharge: 2014-06-01 | DRG: 482 | Disposition: A | Discharge status: Skilled Nursing Facility | Payer: Medicare Other | Attending: Family Medicine | Admitting: Family Medicine

## 2014-05-26 ENCOUNTER — Emergency Department (HOSPITAL_COMMUNITY): Payer: Medicare Other

## 2014-05-26 ENCOUNTER — Encounter (HOSPITAL_COMMUNITY): Payer: Self-pay

## 2014-05-26 DIAGNOSIS — E876 Hypokalemia: Secondary | ICD-10-CM | POA: Diagnosis present

## 2014-05-26 DIAGNOSIS — W19XXXA Unspecified fall, initial encounter: Secondary | ICD-10-CM

## 2014-05-26 DIAGNOSIS — R110 Nausea: Secondary | ICD-10-CM | POA: Diagnosis not present

## 2014-05-26 DIAGNOSIS — S72141A Displaced intertrochanteric fracture of right femur, initial encounter for closed fracture: Principal | ICD-10-CM | POA: Diagnosis present

## 2014-05-26 DIAGNOSIS — E780 Pure hypercholesterolemia: Secondary | ICD-10-CM | POA: Diagnosis present

## 2014-05-26 DIAGNOSIS — F17290 Nicotine dependence, other tobacco product, uncomplicated: Secondary | ICD-10-CM | POA: Diagnosis present

## 2014-05-26 DIAGNOSIS — E785 Hyperlipidemia, unspecified: Secondary | ICD-10-CM | POA: Diagnosis present

## 2014-05-26 DIAGNOSIS — H409 Unspecified glaucoma: Secondary | ICD-10-CM | POA: Diagnosis present

## 2014-05-26 DIAGNOSIS — S72001A Fracture of unspecified part of neck of right femur, initial encounter for closed fracture: Secondary | ICD-10-CM

## 2014-05-26 DIAGNOSIS — Y92090 Kitchen in other non-institutional residence as the place of occurrence of the external cause: Secondary | ICD-10-CM

## 2014-05-26 DIAGNOSIS — K5900 Constipation, unspecified: Secondary | ICD-10-CM | POA: Diagnosis not present

## 2014-05-26 DIAGNOSIS — M25551 Pain in right hip: Secondary | ICD-10-CM | POA: Diagnosis not present

## 2014-05-26 DIAGNOSIS — M810 Age-related osteoporosis without current pathological fracture: Secondary | ICD-10-CM | POA: Diagnosis present

## 2014-05-26 DIAGNOSIS — W010XXA Fall on same level from slipping, tripping and stumbling without subsequent striking against object, initial encounter: Secondary | ICD-10-CM | POA: Diagnosis present

## 2014-05-26 DIAGNOSIS — S72009A Fracture of unspecified part of neck of unspecified femur, initial encounter for closed fracture: Secondary | ICD-10-CM | POA: Diagnosis present

## 2014-05-26 LAB — CBC WITH DIFFERENTIAL/PLATELET
Basophils Absolute: 0.1 10*3/uL (ref 0.0–0.1)
Basophils Relative: 1 % (ref 0–1)
Eosinophils Absolute: 0.1 10*3/uL (ref 0.0–0.7)
Eosinophils Relative: 1 % (ref 0–5)
HCT: 40.4 % (ref 36.0–46.0)
HEMOGLOBIN: 12.9 g/dL (ref 12.0–15.0)
LYMPHS ABS: 1.5 10*3/uL (ref 0.7–4.0)
Lymphocytes Relative: 26 % (ref 12–46)
MCH: 29.9 pg (ref 26.0–34.0)
MCHC: 31.9 g/dL (ref 30.0–36.0)
MCV: 93.5 fL (ref 78.0–100.0)
MONOS PCT: 10 % (ref 3–12)
Monocytes Absolute: 0.6 10*3/uL (ref 0.1–1.0)
Neutro Abs: 3.6 10*3/uL (ref 1.7–7.7)
Neutrophils Relative %: 62 % (ref 43–77)
Platelets: 195 10*3/uL (ref 150–400)
RBC: 4.32 MIL/uL (ref 3.87–5.11)
RDW: 13.6 % (ref 11.5–15.5)
WBC: 5.8 10*3/uL (ref 4.0–10.5)

## 2014-05-26 LAB — COMPREHENSIVE METABOLIC PANEL
ALBUMIN: 4.2 g/dL (ref 3.5–5.2)
ALT: 23 U/L (ref 0–35)
AST: 26 U/L (ref 0–37)
Alkaline Phosphatase: 61 U/L (ref 39–117)
Anion gap: 5 (ref 5–15)
BILIRUBIN TOTAL: 0.5 mg/dL (ref 0.3–1.2)
BUN: 16 mg/dL (ref 6–23)
CALCIUM: 9.6 mg/dL (ref 8.4–10.5)
CO2: 27 mmol/L (ref 19–32)
CREATININE: 0.96 mg/dL (ref 0.50–1.10)
Chloride: 109 mmol/L (ref 96–112)
GFR calc Af Amer: 59 mL/min — ABNORMAL LOW (ref >90)
GFR calc non Af Amer: 51 mL/min — ABNORMAL LOW (ref >90)
Glucose, Bld: 145 mg/dL — ABNORMAL HIGH (ref 70–99)
POTASSIUM: 3.3 mmol/L — AB (ref 3.5–5.1)
Sodium: 141 mmol/L (ref 135–145)
Total Protein: 7 g/dL (ref 6.0–8.3)

## 2014-05-26 LAB — MAGNESIUM: Magnesium: 2.2 mg/dL (ref 1.5–2.5)

## 2014-05-26 MED ORDER — HYDROMORPHONE HCL 1 MG/ML IJ SOLN
0.5000 mg | Freq: Once | INTRAMUSCULAR | Status: AC
  Administered 2014-05-26: 0.5 mg via INTRAVENOUS
  Filled 2014-05-26: qty 1

## 2014-05-26 MED ORDER — POTASSIUM CHLORIDE IN NACL 20-0.9 MEQ/L-% IV SOLN
INTRAVENOUS | Status: DC
  Administered 2014-05-26 – 2014-06-01 (×7): via INTRAVENOUS
  Filled 2014-05-26: qty 1000

## 2014-05-26 MED ORDER — CLONAZEPAM 0.5 MG PO TABS
0.5000 mg | ORAL_TABLET | Freq: Every day | ORAL | Status: DC
  Administered 2014-05-26 – 2014-05-31 (×6): 0.5 mg via ORAL
  Filled 2014-05-26 (×6): qty 1

## 2014-05-26 MED ORDER — POTASSIUM CHLORIDE CRYS ER 20 MEQ PO TBCR
40.0000 meq | EXTENDED_RELEASE_TABLET | Freq: Once | ORAL | Status: AC
  Administered 2014-05-26: 40 meq via ORAL
  Filled 2014-05-26: qty 2

## 2014-05-26 MED ORDER — ATORVASTATIN CALCIUM 10 MG PO TABS
10.0000 mg | ORAL_TABLET | Freq: Every day | ORAL | Status: DC
  Administered 2014-05-27 – 2014-05-31 (×5): 10 mg via ORAL
  Filled 2014-05-26 (×6): qty 1

## 2014-05-26 MED ORDER — HEPARIN SODIUM (PORCINE) 5000 UNIT/ML IJ SOLN
5000.0000 [IU] | Freq: Three times a day (TID) | INTRAMUSCULAR | Status: DC
  Administered 2014-05-26 – 2014-05-28 (×5): 5000 [IU] via SUBCUTANEOUS
  Filled 2014-05-26 (×5): qty 1

## 2014-05-26 MED ORDER — MORPHINE SULFATE 2 MG/ML IJ SOLN
1.0000 mg | INTRAMUSCULAR | Status: DC | PRN
Start: 2014-05-26 — End: 2014-06-01
  Administered 2014-05-26 – 2014-05-28 (×12): 2 mg via INTRAVENOUS
  Filled 2014-05-26 (×12): qty 1

## 2014-05-26 MED ORDER — ASPIRIN 81 MG PO CHEW: 81.0000 mg | CHEWABLE_TABLET | Freq: Every day | ORAL | Status: DC

## 2014-05-26 MED ORDER — ONDANSETRON HCL 4 MG PO TABS
4.0000 mg | ORAL_TABLET | Freq: Four times a day (QID) | ORAL | Status: DC | PRN
  Administered 2014-05-26 – 2014-05-29 (×8): 4 mg via ORAL
  Filled 2014-05-26 (×8): qty 1

## 2014-05-26 NOTE — ED Provider Notes (Signed)
CSN: [id number]     Arrival date & time 05/26/14  1909 History   First MD Initiated Contact with Patient 05/26/14 1914     Chief Complaint  Patient presents with  . Fall     (Consider location/radiation/quality/duration/timing/severity/associated sxs/prior Treatment) Patient is a 81 y.o. female presenting with fall. The history is provided by the patient (the pt states she was walking and tripped.  she has right hip pain).  Fall This is a new problem. The current episode started less than 1 hour ago. The problem occurs constantly. The problem has not changed since onset.Pertinent negatives include no chest pain, no abdominal pain and no headaches. Exacerbated by: movement. Nothing relieves the symptoms.    Past Medical History  Diagnosis Date  . Osteoporosis   . High cholesterol   . Constipation   . Glaucoma   . Shortness of breath on exertion    Past Surgical History  Procedure Laterality Date  . Tonsillectomy    . Cesarean section    . Cataract extraction w/phaco Right 01/20/2013    Procedure: CATARACT EXTRACTION PHACO AND INTRAOCULAR LENS PLACEMENT (IOC);  Surgeon: Gemma Payor, MD;  Location: [facility];  Service: Ophthalmology;  Laterality: Right;  CDE:  18.27  . Eye surgery     No family history on file. History  Substance Use Topics  . Smoking status: Never Smoker   . Smokeless tobacco: Current User    Types: Snuff  . Alcohol Use: No   OB History    Gravida Para Term Preterm AB TAB SAB Ectopic Multiple Living   Review of Systems  Constitutional: Negative for appetite change and fatigue.  HENT: Negative for congestion, ear discharge and sinus pressure.   Eyes: Negative for discharge.  Respiratory: Negative for cough.   Cardiovascular: Negative for chest pain.  Gastrointestinal: Negative for abdominal pain and diarrhea.  Genitourinary: Negative for frequency and hematuria.  Musculoskeletal: Negative for back pain.       Hip pain  Skin: Negative  for rash.  Neurological: Negative for seizures and headaches.  Psychiatric/Behavioral: Negative for hallucinations.      Allergies  Review of patient's allergies indicates no known allergies.  Home Medications   Prior to Admission medications   Medication Sig Start Date End Date Taking? Authorizing Provider  aspirin 81 MG chewable tablet Chew 81 mg by mouth daily.     Yes Historical Provider, MD  atorvastatin (LIPITOR) 10 MG tablet Take 10 mg by mouth every morning.    Yes Historical Provider, MD  carisoprodol (SOMA) 350 MG tablet Take 350 mg by mouth at bedtime. *May take one to two additional tablets only if needed For sleep and muscle relaxant   Yes Historical Provider, MD  clonazePAM (KLONOPIN) 0.5 MG tablet Take 0.5 mg by mouth at bedtime. *May take one to two tablets daily as needed For anxiety   Yes Historical Provider, MD  HYDROcodone-acetaminophen (NORCO) 7.5-325 MG per tablet Take 1 tablet by mouth every 4 (four) hours.   Yes Historical Provider, MD  loratadine (CLARITIN) 10 MG tablet Take 10 mg by mouth daily as needed for allergies.   Yes Historical Provider, MD  ondansetron (ZOFRAN) 4 MG tablet Take 4 mg by mouth every 6 (six) hours as needed for nausea or vomiting.   Yes Historical Provider, MD   BP 181/72 mmHg  Pulse 81  Temp(Src) 98 F (36.7 C) (Oral)  Resp 20  SpO2 99% Physical Exam  Constitutional: She is oriented to person, place, and time. She appears well-developed.  HENT:  Head: Normocephalic.  Eyes: Conjunctivae and EOM are normal. No scleral icterus.  Neck: Neck supple. No thyromegaly present.  Cardiovascular: Normal rate and regular rhythm.  Exam reveals no gallop and no friction rub.   No murmur heard. Pulmonary/Chest: No stridor. She has no wheezes. She has no rales. She exhibits no tenderness.  Abdominal: She exhibits no distension. There is no tenderness. There is no rebound.  Musculoskeletal: She exhibits tenderness. She exhibits no edema.  Tender  right hip  Lymphadenopathy:    She has no cervical adenopathy.  Neurological: She is oriented to person, place, and time. She exhibits normal muscle tone. Coordination normal.  Skin: No rash noted. No erythema.  Psychiatric: She has a normal mood and affect. Her behavior is normal.    ED Course  Procedures (including critical care time) Labs Review Labs Reviewed  COMPREHENSIVE METABOLIC PANEL - Abnormal; Notable for the following:    Potassium 3.3 (*)    Glucose, Bld 145 (*)    GFR calc non Af Amer 51 (*)    GFR calc Af Amer 59 (*)    All other components within normal limits  CBC WITH DIFFERENTIAL/PLATELET  MAGNESIUM    Imaging Review Dg Chest 1 View  05/26/2014   CLINICAL DATA:  Patient fell earlier in the day  EXAM: CHEST - 1 VIEW  COMPARISON:  Sep 19, 2013  FINDINGS: There is a calcified granuloma in the left base. No edema or consolidation. Heart is upper normal in size with pulmonary vascularity within normal limits. There is atherosclerotic change in the aorta. No pneumothorax. No acute fractures are evident on this study  IMPRESSION: No edema or consolidation. Granuloma left base. No apparent pneumothorax.   Electronically Signed   By: Bretta Bang M.D.   On: 05/26/2014 20:36   Ct Head Wo Contrast  05/26/2014   CLINICAL DATA:  Fall today, right hip pain  EXAM: CT HEAD WITHOUT CONTRAST  CT CERVICAL SPINE WITHOUT CONTRAST  TECHNIQUE: Multidetector CT imaging of the head and cervical spine was performed following the standard protocol without intravenous contrast. Multiplanar CT image reconstructions of the cervical spine were also generated.  COMPARISON:  09/19/2013  FINDINGS: CT HEAD FINDINGS  No skull fracture is noted. Paranasal sinuses and mastoid air cells are unremarkable. Stable cerebral atrophy. No intracranial hemorrhage, mass effect or midline shift. No acute cortical infarction. Stable periventricular and patchy subcortical chronic white matter disease. No mass lesion  is noted on this unenhanced scan.  CT CERVICAL SPINE FINDINGS  Axial images of the cervical spine shows no acute fracture or subluxation. Computer processed images shows no acute fracture or subluxation. There is diffuse osteopenia. Mild degenerative changes C1-C2 articulation. Mild anterior spurring lower endplate of C4 and C5 vertebral bodies. No prevertebral soft tissue swelling. Cervical airway is patent. There is no pneumothorax in visualized lung apices. Bilateral apical scarring. Atherosclerotic calcifications are noted bilateral carotid bifurcation.  IMPRESSION: 1. No acute intracranial abnormality. Stable atrophy and chronic white matter disease. 2. No cervical spine acute fracture or subluxation. Minimal degenerative changes.   Electronically Signed   By: Natasha Mead M.D.   On: 05/26/2014 20:58   Ct Cervical Spine Wo Contrast  05/26/2014   CLINICAL DATA:  Fall today, right hip pain  EXAM: CT HEAD WITHOUT CONTRAST  CT CERVICAL SPINE WITHOUT CONTRAST  TECHNIQUE: Multidetector CT imaging of the head  and cervical spine was performed following the standard protocol without intravenous contrast. Multiplanar CT image reconstructions of the cervical spine were also generated.  COMPARISON:  09/19/2013  FINDINGS: CT HEAD FINDINGS  No skull fracture is noted. Paranasal sinuses and mastoid air cells are unremarkable. Stable cerebral atrophy. No intracranial hemorrhage, mass effect or midline shift. No acute cortical infarction. Stable periventricular and patchy subcortical chronic white matter disease. No mass lesion is noted on this unenhanced scan.  CT CERVICAL SPINE FINDINGS  Axial images of the cervical spine shows no acute fracture or subluxation. Computer processed images shows no acute fracture or subluxation. There is diffuse osteopenia. Mild degenerative changes C1-C2 articulation. Mild anterior spurring lower endplate of C4 and C5 vertebral bodies. No prevertebral soft tissue swelling. Cervical airway is  patent. There is no pneumothorax in visualized lung apices. Bilateral apical scarring. Atherosclerotic calcifications are noted bilateral carotid bifurcation.  IMPRESSION: 1. No acute intracranial abnormality. Stable atrophy and chronic white matter disease. 2. No cervical spine acute fracture or subluxation. Minimal degenerative changes.   Electronically Signed   By: Natasha MeadLiviu  Pop M.D.   On: 05/26/2014 20:58   Dg Hip Unilat With Pelvis 2-3 Views Right  05/26/2014   CLINICAL DATA:  Pain following fall  EXAM: DG HIP W/ PELVIS 2-3V*R*  COMPARISON:  None.  FINDINGS: Frontal pelvis as well as frontal and lateral right hip images were obtained. There is a comminuted intertrochanteric femur fracture on the right with marked varus angulation at the fracture site. There are several displaced fracture fragments in this area. There is avulsion of the lesser trochanter on the right.  No other fractures. No dislocation. There is mild symmetric narrowing of both hip joints. Bones are osteoporotic.  IMPRESSION: Comminuted intertrochanteric femur fracture on the right with varus angulation of the fracture site. No dislocation. Bones osteoporotic.   Electronically Signed   By: Bretta BangWilliam  Woodruff M.D.   On: 05/26/2014 20:37     EKG Interpretation None      MDM   Final diagnoses:  Fall  Hip fracture, right, closed, initial encounter    Spoke with dr. Romeo AppleHarrison who will see the pt in the am       Benny LennertJoseph L Lachele Lievanos, MD 05/26/14 2129

## 2014-05-26 NOTE — ED Notes (Signed)
Pt fell while going from one room to another in her home.  Pt has shortening and rotation to right leg.  Pt also c/o back pain that is not new tonight.

## 2014-05-26 NOTE — H&P (Signed)
Hospitalist Admission History and Physical  Patient name: Alexandra Riley Medical record number: [id number] Date of birth: 10-26-1921 Age: 81 y.o. Gender: female  Primary Care Provider: Alice ReichertMCINNIS,ANGUS G, MD  Chief Complaint: mechanical fall, R hip fracture   History of Present Illness:This is a 81 y.o. year old female with significant past medical history of osteoporosis, HLD presenting with mechanical fall, R hip fracture. Pt currently lives at home w/ help of family member. Daughter reports pt as "fairly independent". Intermittently ambulates w/ rolling walker, however does a lot of walking unassisted. Per report, pt slipped at home today, landing on R hip. No LOC, direct head trauma. Pt, family deny any CP, SOB, weakness prior to fall. No hemiparesis or confusion.  Presented to [facility] ER hemodynamically stable, afebrile. Satting well on RA. CBC and BMET WNL apart from K 3.3. Head CT, CT c spine, CXR showing no acute abnormalities. R hip xray shows Comminuted intertrochanteric femur fracture on the right with varus angulation of the fracture site. EDP discussed case w/ on call orthopedics Romeo Apple([place]). Will see in am for possible surgical correction.   Assessment and Plan:  Alexandra Riley is a 81 y.o. year old female presenting with Mechanical  Fall, R hip fracture  Active Problems:   Hip fracture   1- Mechanical Fall/R hip fracture -fall mechanical in etiology -overall well appearing on presentation, conversive  -pain control  -follow up ortho recs in am  -check vit D level   2- HLD  -cont statin   3-Hypokalemia  -replete  -check mag level   FEN/GI: NPO PMN  Prophylaxis: sub q heparin  Disposition: pending further evaluation  Code Status:Full Code    Patient Active Problem List   Diagnosis Date Noted  . Hip fracture 05/26/2014   Past Medical History: Past Medical History  Diagnosis Date  . Osteoporosis   . High cholesterol   . Constipation   . Glaucoma   .  Shortness of breath on exertion     Past Surgical History: Past Surgical History  Procedure Laterality Date  . Tonsillectomy    . Cesarean section    . Cataract extraction w/phaco Right 01/20/2013    Procedure: CATARACT EXTRACTION PHACO AND INTRAOCULAR LENS PLACEMENT (IOC);  Surgeon: Gemma PayorKerry Hunt, MD;  Location: [facility];  Service: Ophthalmology;  Laterality: Right;  CDE:  18.27  . Eye surgery      Social History: History   Social History  . Marital Status: Widowed    Spouse Name: N/A    Number of Children: N/A  . Years of Education: N/A   Social History Main Topics  . Smoking status: Never Smoker   . Smokeless tobacco: Current User    Types: Snuff  . Alcohol Use: No  . Drug Use: No  . Sexual Activity: No   Other Topics Concern  . None   Social History Narrative    Family History: No family history on file.  Allergies: No Known Allergies  Current Facility-Administered Medications  Medication Dose Route Frequency Provider Last Rate Last Dose  . 0.9 % NaCl with KCl 20 mEq/ L  infusion   Intravenous Continuous Doree AlbeeSteven Diera Wirkkala, MD      . aspirin chewable tablet 81 mg  81 mg Oral Daily Doree AlbeeSteven Aoki Wedemeyer, MD      . Melene Muller[START ON 05/27/2014] atorvastatin (LIPITOR) tablet 10 mg  10 mg Oral q1800 Doree AlbeeSteven Sacoya Mcgourty, MD      . clonazePAM [facility](KLONOPIN) tablet 0.5 mg  0.5 mg Oral QHS  Doree Albee, MD      . heparin injection 5,000 Units  5,000 Units Subcutaneous 3 times per day Doree Albee, MD      . morphine 2 MG/ML injection 1-2 mg  1-2 mg Intravenous Q2H PRN Doree Albee, MD      . ondansetron [facility]) tablet 4 mg  4 mg Oral Q6H PRN Doree Albee, MD      . potassium chloride SA (K-DUR,KLOR-CON) CR tablet 40 mEq  40 mEq Oral Once Benny Lennert, MD       Current Outpatient Prescriptions  Medication Sig Dispense Refill  . aspirin 81 MG chewable tablet Chew 81 mg by mouth daily.      Marland Kitchen atorvastatin (LIPITOR) 10 MG tablet Take 10 mg by mouth every morning.     . carisoprodol (SOMA) 350 MG  tablet Take 350 mg by mouth at bedtime. *May take one to two additional tablets only if needed For sleep and muscle relaxant    . clonazePAM (KLONOPIN) 0.5 MG tablet Take 0.5 mg by mouth at bedtime. *May take one to two tablets daily as needed For anxiety    . HYDROcodone-acetaminophen (NORCO) 7.5-325 MG per tablet Take 1 tablet by mouth every 4 (four) hours.    Marland Kitchen loratadine (CLARITIN) 10 MG tablet Take 10 mg by mouth daily as needed for allergies.    Marland Kitchen ondansetron (ZOFRAN) 4 MG tablet Take 4 mg by mouth every 6 (six) hours as needed for nausea or vomiting.     Review Of Systems: 12 point ROS negative except as noted above in HPI.  Physical Exam: Filed Vitals:   05/26/14 2130  BP: 145/86  Pulse: 92  Temp:   Resp:     General: alert, cooperative and minimal to mild distress 2/2 R hip pain , overall well appearing  HEENT: PERRLA and extra ocular movement intact Heart: S1, S2 normal, no murmur, rub or gallop, regular rate and rhythm Lungs: clear to auscultation, no wheezes or rales and unlabored breathing Abdomen: abdomen is soft without significant tenderness, masses, organomegaly or guarding Extremities: R hip TTP, neurovascularly intact distally  Skin:no rashes, no ecchymoses Neurology: normal without focal findings  Labs and Imaging: Lab Results  Component Value Date/Time   NA 141 05/26/2014 07:40 PM   K 3.3* 05/26/2014 07:40 PM   CL 109 05/26/2014 07:40 PM   CO2 27 05/26/2014 07:40 PM   BUN 16 05/26/2014 07:40 PM   CREATININE 0.96 05/26/2014 07:40 PM   GLUCOSE 145* 05/26/2014 07:40 PM   Lab Results  Component Value Date   WBC 5.8 05/26/2014   HGB 12.9 05/26/2014   HCT 40.4 05/26/2014   MCV 93.5 05/26/2014   PLT 195 05/26/2014    Dg Chest 1 View  05/26/2014   CLINICAL DATA:  Patient fell earlier in the day  EXAM: CHEST - 1 VIEW  COMPARISON:  Sep 19, 2013  FINDINGS: There is a calcified granuloma in the left base. No edema or consolidation. Heart is upper normal in size  with pulmonary vascularity within normal limits. There is atherosclerotic change in the aorta. No pneumothorax. No acute fractures are evident on this study  IMPRESSION: No edema or consolidation. Granuloma left base. No apparent pneumothorax.   Electronically Signed   By: Bretta Bang M.D.   On: 05/26/2014 20:36   Ct Head Wo Contrast  05/26/2014   CLINICAL DATA:  Fall today, right hip pain  EXAM: CT HEAD WITHOUT CONTRAST  CT CERVICAL SPINE WITHOUT CONTRAST  TECHNIQUE: Multidetector CT imaging of the head and cervical spine was performed following the standard protocol without intravenous contrast. Multiplanar CT image reconstructions of the cervical spine were also generated.  COMPARISON:  09/19/2013  FINDINGS: CT HEAD FINDINGS  No skull fracture is noted. Paranasal sinuses and mastoid air cells are unremarkable. Stable cerebral atrophy. No intracranial hemorrhage, mass effect or midline shift. No acute cortical infarction. Stable periventricular and patchy subcortical chronic white matter disease. No mass lesion is noted on this unenhanced scan.  CT CERVICAL SPINE FINDINGS  Axial images of the cervical spine shows no acute fracture or subluxation. Computer processed images shows no acute fracture or subluxation. There is diffuse osteopenia. Mild degenerative changes C1-C2 articulation. Mild anterior spurring lower endplate of C4 and C5 vertebral bodies. No prevertebral soft tissue swelling. Cervical airway is patent. There is no pneumothorax in visualized lung apices. Bilateral apical scarring. Atherosclerotic calcifications are noted bilateral carotid bifurcation.  IMPRESSION: 1. No acute intracranial abnormality. Stable atrophy and chronic white matter disease. 2. No cervical spine acute fracture or subluxation. Minimal degenerative changes.   Electronically Signed   By: Natasha Mead M.D.   On: 05/26/2014 20:58   Ct Cervical Spine Wo Contrast  05/26/2014   CLINICAL DATA:  Fall today, right hip pain   EXAM: CT HEAD WITHOUT CONTRAST  CT CERVICAL SPINE WITHOUT CONTRAST  TECHNIQUE: Multidetector CT imaging of the head and cervical spine was performed following the standard protocol without intravenous contrast. Multiplanar CT image reconstructions of the cervical spine were also generated.  COMPARISON:  09/19/2013  FINDINGS: CT HEAD FINDINGS  No skull fracture is noted. Paranasal sinuses and mastoid air cells are unremarkable. Stable cerebral atrophy. No intracranial hemorrhage, mass effect or midline shift. No acute cortical infarction. Stable periventricular and patchy subcortical chronic white matter disease. No mass lesion is noted on this unenhanced scan.  CT CERVICAL SPINE FINDINGS  Axial images of the cervical spine shows no acute fracture or subluxation. Computer processed images shows no acute fracture or subluxation. There is diffuse osteopenia. Mild degenerative changes C1-C2 articulation. Mild anterior spurring lower endplate of C4 and C5 vertebral bodies. No prevertebral soft tissue swelling. Cervical airway is patent. There is no pneumothorax in visualized lung apices. Bilateral apical scarring. Atherosclerotic calcifications are noted bilateral carotid bifurcation.  IMPRESSION: 1. No acute intracranial abnormality. Stable atrophy and chronic white matter disease. 2. No cervical spine acute fracture or subluxation. Minimal degenerative changes.   Electronically Signed   By: Natasha Mead M.D.   On: 05/26/2014 20:58   Dg Hip Unilat With Pelvis 2-3 Views Right  05/26/2014   CLINICAL DATA:  Pain following fall  EXAM: DG HIP W/ PELVIS 2-3V*R*  COMPARISON:  None.  FINDINGS: Frontal pelvis as well as frontal and lateral right hip images were obtained. There is a comminuted intertrochanteric femur fracture on the right with marked varus angulation at the fracture site. There are several displaced fracture fragments in this area. There is avulsion of the lesser trochanter on the right.  No other fractures. No  dislocation. There is mild symmetric narrowing of both hip joints. Bones are osteoporotic.  IMPRESSION: Comminuted intertrochanteric femur fracture on the right with varus angulation of the fracture site. No dislocation. Bones osteoporotic.   Electronically Signed   By: Bretta Bang M.D.   On: 05/26/2014 20:37           Doree Albee MD  Pager: [phone]

## 2014-05-27 DIAGNOSIS — E780 Pure hypercholesterolemia: Secondary | ICD-10-CM | POA: Diagnosis present

## 2014-05-27 DIAGNOSIS — K5900 Constipation, unspecified: Secondary | ICD-10-CM | POA: Diagnosis not present

## 2014-05-27 DIAGNOSIS — Y92090 Kitchen in other non-institutional residence as the place of occurrence of the external cause: Secondary | ICD-10-CM | POA: Diagnosis not present

## 2014-05-27 DIAGNOSIS — E876 Hypokalemia: Secondary | ICD-10-CM | POA: Diagnosis present

## 2014-05-27 DIAGNOSIS — W010XXA Fall on same level from slipping, tripping and stumbling without subsequent striking against object, initial encounter: Secondary | ICD-10-CM | POA: Diagnosis present

## 2014-05-27 DIAGNOSIS — M25551 Pain in right hip: Secondary | ICD-10-CM | POA: Diagnosis present

## 2014-05-27 DIAGNOSIS — F17290 Nicotine dependence, other tobacco product, uncomplicated: Secondary | ICD-10-CM | POA: Diagnosis present

## 2014-05-27 DIAGNOSIS — R110 Nausea: Secondary | ICD-10-CM | POA: Diagnosis not present

## 2014-05-27 DIAGNOSIS — H409 Unspecified glaucoma: Secondary | ICD-10-CM | POA: Diagnosis present

## 2014-05-27 DIAGNOSIS — S72141A Displaced intertrochanteric fracture of right femur, initial encounter for closed fracture: Secondary | ICD-10-CM | POA: Diagnosis present

## 2014-05-27 DIAGNOSIS — M810 Age-related osteoporosis without current pathological fracture: Secondary | ICD-10-CM | POA: Diagnosis present

## 2014-05-27 DIAGNOSIS — E785 Hyperlipidemia, unspecified: Secondary | ICD-10-CM | POA: Diagnosis present

## 2014-05-27 LAB — SURGICAL PCR SCREEN
MRSA, PCR: NEGATIVE
STAPHYLOCOCCUS AUREUS: NEGATIVE

## 2014-05-27 LAB — COMPREHENSIVE METABOLIC PANEL
ALT: 20 U/L (ref 0–35)
ANION GAP: 5 (ref 5–15)
AST: 23 U/L (ref 0–37)
Albumin: 3.7 g/dL (ref 3.5–5.2)
Alkaline Phosphatase: 56 U/L (ref 39–117)
BUN: 12 mg/dL (ref 6–23)
CALCIUM: 8.5 mg/dL (ref 8.4–10.5)
CO2: 23 mmol/L (ref 19–32)
Chloride: 113 mmol/L — ABNORMAL HIGH (ref 96–112)
Creatinine, Ser: 0.72 mg/dL (ref 0.50–1.10)
GFR calc Af Amer: 85 mL/min — ABNORMAL LOW (ref >90)
GFR, EST NON AFRICAN AMERICAN: 73 mL/min — AB (ref >90)
GLUCOSE: 142 mg/dL — AB (ref 70–99)
POTASSIUM: 4.2 mmol/L (ref 3.5–5.1)
SODIUM: 141 mmol/L (ref 135–145)
TOTAL PROTEIN: 6.2 g/dL (ref 6.0–8.3)
Total Bilirubin: 0.5 mg/dL (ref 0.3–1.2)

## 2014-05-27 LAB — CBC WITH DIFFERENTIAL/PLATELET
BASOS PCT: 0 % (ref 0–1)
Basophils Absolute: 0 10*3/uL (ref 0.0–0.1)
EOS PCT: 0 % (ref 0–5)
Eosinophils Absolute: 0 10*3/uL (ref 0.0–0.7)
HEMATOCRIT: 36 % (ref 36.0–46.0)
Hemoglobin: 11.5 g/dL — ABNORMAL LOW (ref 12.0–15.0)
Lymphocytes Relative: 13 % (ref 12–46)
Lymphs Abs: 1.4 10*3/uL (ref 0.7–4.0)
MCH: 29.8 pg (ref 26.0–34.0)
MCHC: 31.9 g/dL (ref 30.0–36.0)
MCV: 93.3 fL (ref 78.0–100.0)
MONO ABS: 1 10*3/uL (ref 0.1–1.0)
MONOS PCT: 9 % (ref 3–12)
NEUTROS ABS: 8.5 10*3/uL — AB (ref 1.7–7.7)
Neutrophils Relative %: 78 % — ABNORMAL HIGH (ref 43–77)
Platelets: 197 10*3/uL (ref 150–400)
RBC: 3.86 MIL/uL — AB (ref 3.87–5.11)
RDW: 13.6 % (ref 11.5–15.5)
WBC: 10.9 10*3/uL — ABNORMAL HIGH (ref 4.0–10.5)

## 2014-05-27 LAB — ABO/RH: ABO/RH(D): O POS

## 2014-05-27 LAB — PREPARE RBC (CROSSMATCH)

## 2014-05-27 MED ORDER — ALBUTEROL SULFATE (2.5 MG/3ML) 0.083% IN NEBU: 2.5000 mg | INHALATION_SOLUTION | Freq: Four times a day (QID) | RESPIRATORY_TRACT | Status: DC

## 2014-05-27 MED ORDER — CEFAZOLIN SODIUM-DEXTROSE 2-3 GM-% IV SOLR
2.0000 g | Freq: Once | INTRAVENOUS | Status: DC
  Filled 2014-05-27: qty 50

## 2014-05-27 MED ORDER — SODIUM CHLORIDE 0.9 % IV SOLN: Freq: Once | INTRAVENOUS | Status: DC

## 2014-05-27 MED ORDER — SIMETHICONE 80 MG PO CHEW
80.0000 mg | CHEWABLE_TABLET | Freq: Four times a day (QID) | ORAL | Status: DC | PRN
Start: 2014-05-27 — End: 2014-06-01
  Administered 2014-05-27 – 2014-05-30 (×2): 80 mg via ORAL
  Filled 2014-05-27 (×3): qty 1

## 2014-05-27 MED ORDER — POVIDONE-IODINE 10 % EX SOLN
Freq: Once | CUTANEOUS | Status: AC
  Administered 2014-05-28: via TOPICAL

## 2014-05-27 MED ORDER — ALBUTEROL SULFATE (2.5 MG/3ML) 0.083% IN NEBU: 2.5000 mg | INHALATION_SOLUTION | RESPIRATORY_TRACT | Status: DC | PRN

## 2014-05-27 NOTE — Clinical Social Work Psychosocial (Signed)
Clinical Social Work Department BRIEF PSYCHOSOCIAL ASSESSMENT 05/27/2014  Patient:  BLANDINA, RENALDO     Account Number:  [id number]     Admit date:  05/26/2014  Clinical Social Worker:  Wyatt Haste  Date/Time:  05/27/2014 09:29 AM  Referred by:  CSW  Date Referred:  05/27/2014 Referred for  SNF Placement   Other Referral:   Interview type:  Patient Other interview type:   daughter- Vilma    PSYCHOSOCIAL DATA Living Status:  ALONE Admitted from facility:   Level of care:   Primary support name:  Vilma Primary support relationship to patient:  CHILD, ADULT Degree of support available:   supportive    CURRENT CONCERNS Current Concerns  Post-Acute Placement   Other Concerns:    SOCIAL WORK ASSESSMENT / PLAN CSW met with pt at bedside. Pt alert and oriented. She states she lives alone and generally does very well. Yesterday, pt was walking into her den when she fell, fracturing her hip. Surgery scheduled for tomorrow. Pt has 2 sons and a daughter. She reports Gwyndolyn Saxon and Vilma live locally and do all they can for her, but both have medical issues. Pt's niece was staying with her for a few days. At baseline, pt is independent and sometimes uses a walker. She relies on her children for transportation. CSW discussed upcoming surgery and recovery with pt. She states that she does not know how to feel about surgery, but said, "I don't want to have to leave my home." CSW explained that typically pts require short term SNF after surgery. Pt immediately said that her daughter would need to be contacted. CSW spoke with Vilma who reports she has been worried about pt's response towards SNF. She is well aware that pt will require rehab, but said pt is fiercely independent. She agrees to initiate bed search in [place] only at this point.   Assessment/plan status:  Psychosocial Support/Ongoing Assessment of Needs Other assessment/ plan:   Information/referral to community resources:    SNF list    PATIENT'S/FAMILY'S RESPONSE TO PLAN OF CARE: Pt defers discharge planning to her daughter. CSW agreed to follow up with pt and daughter after surgery/PT to discuss further.       Benay Pike, [place]

## 2014-05-27 NOTE — Clinical Social Work Placement (Signed)
Clinical Social Work Department CLINICAL SOCIAL WORK PLACEMENT NOTE 05/27/2014  Patient:  Alexandra Riley,Alexandra Riley  Account Number:  [id number] Admit date:  05/26/2014  Clinical Social Worker:  Derenda FennelKARA De Libman, LCSW  Date/time:  05/27/2014 09:28 AM  Clinical Social Work is seeking post-discharge placement for this patient at the following level of care:   SKILLED NURSING   (*CSW will update this form in Epic as items are completed)   05/27/2014  [facility]/family provided with [facility] Clinical [facility] Work's list of facilities offering this level of care within the geographic area requested by the patient (or if unable, by the patient's family).  05/27/2014  Patient/family informed of their freedom to choose among providers that offer the needed level of care, that participate in Medicare, Medicaid or managed care program needed by the patient, have an available bed and are willing to accept the patient.  05/27/2014  Patient/family informed of [facility]' ownership interest in [facility], as well as of the fact that they are under no obligation to receive care at this facility.  PASARR submitted to EDS on 05/27/2014 PASARR number received on 05/27/2014  FL2 transmitted to all facilities in geographic area requested by pt/family on  05/27/2014 FL2 transmitted to all facilities within larger geographic area on   Patient informed that his/her managed care company has contracts with or will negotiate with  certain facilities, including the following:     Patient/family informed of bed offers received:   Patient chooses bed at  Physician recommends and patient chooses bed at    Patient to be transferred to  on   Patient to be transferred to facility by  Patient and family notified of transfer on  Name of family member notified:    The following physician request were entered in Epic:   Additional Comments:  Derenda FennelKara Mindi Akerson, LCSW [phone]

## 2014-05-27 NOTE — Consult Note (Signed)
Reason for Consult:Fracture of the right hip Referring Physician: Deaun Rocha is an 81 y.o. female.  HPI: She fell at home yesterday in her kitchen and hit her head near the right eye.  She got up and finished with her sandwich she was making.  She walked into the den and fell again and hurt her right hip.  She could not get up.  She was brought to the ER.  X-rays show displaced intertrochanteric fracture of the right hip.  CT of head was negative.  She uses a walker at home often and functions well in the home.  I have told her she will need surgery on the right hip.  I have gone over the risks and imponderables including infection, possible blood transfusion, possible blood clots or embolus, need for physical therapy and post hospital nursing home placement, and anesthesia.  I would recommend spinal anesthesia.  She appears to understand and agrees to procedure.  With surgery schedule the way it is, I have posted her for tomorrow morning.  She is aware of this.  Past Medical History  Diagnosis Date  . Osteoporosis   . High cholesterol   . Constipation   . Glaucoma   . Shortness of breath on exertion     Past Surgical History  Procedure Laterality Date  . Tonsillectomy    . Cesarean section    . Cataract extraction w/phaco Right 01/20/2013    Procedure: CATARACT EXTRACTION PHACO AND INTRAOCULAR LENS PLACEMENT (IOC);  Surgeon: Tonny Branch, MD;  Location: [facility];  Service: Ophthalmology;  Laterality: Right;  CDE:  18.27  . Eye surgery      History reviewed. No pertinent family history.  Social History:  reports that she has never smoked. Her smokeless tobacco use includes Snuff. She reports that she does not drink alcohol or use illicit drugs.  Allergies: No Known Allergies  Medications: I have reviewed the patient's current medications.  Results for orders placed or performed during the hospital encounter of 05/26/14 (from the past 48 hour(s))  CBC with  Differential/Platelet     Status: None   Collection Time: 05/26/14  7:40 PM  Result Value Ref Range   WBC 5.8 4.0 - 10.5 K/uL   RBC 4.32 3.87 - 5.11 MIL/uL   Hemoglobin 12.9 12.0 - 15.0 g/dL   HCT 40.4 36.0 - 46.0 %   MCV 93.5 78.0 - 100.0 fL   MCH 29.9 26.0 - 34.0 pg   MCHC 31.9 30.0 - 36.0 g/dL   RDW 13.6 11.5 - 15.5 %   Platelets 195 150 - 400 K/uL   Neutrophils Relative % 62 43 - 77 %   Neutro Abs 3.6 1.7 - 7.7 K/uL   Lymphocytes Relative 26 12 - 46 %   Lymphs Abs 1.5 0.7 - 4.0 K/uL   Monocytes Relative 10 3 - 12 %   Monocytes Absolute 0.6 0.1 - 1.0 K/uL   Eosinophils Relative 1 0 - 5 %   Eosinophils Absolute 0.1 0.0 - 0.7 K/uL   Basophils Relative 1 0 - 1 %   Basophils Absolute 0.1 0.0 - 0.1 K/uL  Comprehensive metabolic panel     Status: Abnormal   Collection Time: 05/26/14  7:40 PM  Result Value Ref Range   Sodium 141 135 - 145 mmol/L   Potassium 3.3 (L) 3.5 - 5.1 mmol/L   Chloride 109 96 - 112 mmol/L   CO2 27 19 - 32 mmol/L   Glucose, Bld 145 (  H) 70 - 99 mg/dL   BUN 16 6 - 23 mg/dL   Creatinine, Ser 0.96 0.50 - 1.10 mg/dL   Calcium 9.6 8.4 - 10.5 mg/dL   Total Protein 7.0 6.0 - 8.3 g/dL   Albumin 4.2 3.5 - 5.2 g/dL   AST 26 0 - 37 U/L   ALT 23 0 - 35 U/L   Alkaline Phosphatase 61 39 - 117 U/L   Total Bilirubin 0.5 0.3 - 1.2 mg/dL   GFR calc non Af Amer 51 (L) >90 mL/min   GFR calc Af Amer 59 (L) >90 mL/min    Comment: (NOTE) The eGFR has been calculated using the CKD EPI equation. This calculation has not been validated in all clinical situations. eGFR's persistently <90 mL/min signify possible Chronic Kidney Disease.    Anion gap 5 5 - 15  Magnesium     Status: None   Collection Time: 05/26/14  7:40 PM  Result Value Ref Range   Magnesium 2.2 1.5 - 2.5 mg/dL  Comprehensive metabolic panel     Status: Abnormal   Collection Time: 05/27/14  6:15 AM  Result Value Ref Range   Sodium 141 135 - 145 mmol/L   Potassium 4.2 3.5 - 5.1 mmol/L    Comment: DELTA  CHECK NOTED   Chloride 113 (H) 96 - 112 mmol/L   CO2 23 19 - 32 mmol/L   Glucose, Bld 142 (H) 70 - 99 mg/dL   BUN 12 6 - 23 mg/dL   Creatinine, Ser 0.72 0.50 - 1.10 mg/dL   Calcium 8.5 8.4 - 10.5 mg/dL   Total Protein 6.2 6.0 - 8.3 g/dL   Albumin 3.7 3.5 - 5.2 g/dL   AST 23 0 - 37 U/L   ALT 20 0 - 35 U/L   Alkaline Phosphatase 56 39 - 117 U/L   Total Bilirubin 0.5 0.3 - 1.2 mg/dL   GFR calc non Af Amer 73 (L) >90 mL/min   GFR calc Af Amer 85 (L) >90 mL/min    Comment: (NOTE) The eGFR has been calculated using the CKD EPI equation. This calculation has not been validated in all clinical situations. eGFR's persistently <90 mL/min signify possible Chronic Kidney Disease.    Anion gap 5 5 - 15  CBC WITH DIFFERENTIAL     Status: Abnormal   Collection Time: 05/27/14  6:15 AM  Result Value Ref Range   WBC 10.9 (H) 4.0 - 10.5 K/uL   RBC 3.86 (L) 3.87 - 5.11 MIL/uL   Hemoglobin 11.5 (L) 12.0 - 15.0 g/dL   HCT 36.0 36.0 - 46.0 %   MCV 93.3 78.0 - 100.0 fL   MCH 29.8 26.0 - 34.0 pg   MCHC 31.9 30.0 - 36.0 g/dL   RDW 13.6 11.5 - 15.5 %   Platelets 197 150 - 400 K/uL   Neutrophils Relative % 78 (H) 43 - 77 %   Neutro Abs 8.5 (H) 1.7 - 7.7 K/uL   Lymphocytes Relative 13 12 - 46 %   Lymphs Abs 1.4 0.7 - 4.0 K/uL   Monocytes Relative 9 3 - 12 %   Monocytes Absolute 1.0 0.1 - 1.0 K/uL   Eosinophils Relative 0 0 - 5 %   Eosinophils Absolute 0.0 0.0 - 0.7 K/uL   Basophils Relative 0 0 - 1 %   Basophils Absolute 0.0 0.0 - 0.1 K/uL    Dg Chest 1 View  05/26/2014   CLINICAL DATA:  Patient fell earlier in the day  EXAM: CHEST - 1 VIEW  COMPARISON:  Sep 19, 2013  FINDINGS: There is a calcified granuloma in the left base. No edema or consolidation. Heart is upper normal in size with pulmonary vascularity within normal limits. There is atherosclerotic change in the aorta. No pneumothorax. No acute fractures are evident on this study  IMPRESSION: No edema or consolidation. Granuloma left base. No  apparent pneumothorax.   Electronically Signed   By: Lowella Grip M.D.   On: 05/26/2014 20:36   Ct Head Wo Contrast  05/26/2014   CLINICAL DATA:  Fall today, right hip pain  EXAM: CT HEAD WITHOUT CONTRAST  CT CERVICAL SPINE WITHOUT CONTRAST  TECHNIQUE: Multidetector CT imaging of the head and cervical spine was performed following the standard protocol without intravenous contrast. Multiplanar CT image reconstructions of the cervical spine were also generated.  COMPARISON:  09/19/2013  FINDINGS: CT HEAD FINDINGS  No skull fracture is noted. Paranasal sinuses and mastoid air cells are unremarkable. Stable cerebral atrophy. No intracranial hemorrhage, mass effect or midline shift. No acute cortical infarction. Stable periventricular and patchy subcortical chronic white matter disease. No mass lesion is noted on this unenhanced scan.  CT CERVICAL SPINE FINDINGS  Axial images of the cervical spine shows no acute fracture or subluxation. Computer processed images shows no acute fracture or subluxation. There is diffuse osteopenia. Mild degenerative changes C1-C2 articulation. Mild anterior spurring lower endplate of C4 and C5 vertebral bodies. No prevertebral soft tissue swelling. Cervical airway is patent. There is no pneumothorax in visualized lung apices. Bilateral apical scarring. Atherosclerotic calcifications are noted bilateral carotid bifurcation.  IMPRESSION: 1. No acute intracranial abnormality. Stable atrophy and chronic white matter disease. 2. No cervical spine acute fracture or subluxation. Minimal degenerative changes.   Electronically Signed   By: Lahoma Crocker M.D.   On: 05/26/2014 20:58   Ct Cervical Spine Wo Contrast  05/26/2014   CLINICAL DATA:  Fall today, right hip pain  EXAM: CT HEAD WITHOUT CONTRAST  CT CERVICAL SPINE WITHOUT CONTRAST  TECHNIQUE: Multidetector CT imaging of the head and cervical spine was performed following the standard protocol without intravenous contrast. Multiplanar  CT image reconstructions of the cervical spine were also generated.  COMPARISON:  09/19/2013  FINDINGS: CT HEAD FINDINGS  No skull fracture is noted. Paranasal sinuses and mastoid air cells are unremarkable. Stable cerebral atrophy. No intracranial hemorrhage, mass effect or midline shift. No acute cortical infarction. Stable periventricular and patchy subcortical chronic white matter disease. No mass lesion is noted on this unenhanced scan.  CT CERVICAL SPINE FINDINGS  Axial images of the cervical spine shows no acute fracture or subluxation. Computer processed images shows no acute fracture or subluxation. There is diffuse osteopenia. Mild degenerative changes C1-C2 articulation. Mild anterior spurring lower endplate of C4 and C5 vertebral bodies. No prevertebral soft tissue swelling. Cervical airway is patent. There is no pneumothorax in visualized lung apices. Bilateral apical scarring. Atherosclerotic calcifications are noted bilateral carotid bifurcation.  IMPRESSION: 1. No acute intracranial abnormality. Stable atrophy and chronic white matter disease. 2. No cervical spine acute fracture or subluxation. Minimal degenerative changes.   Electronically Signed   By: Lahoma Crocker M.D.   On: 05/26/2014 20:58   Dg Hip Unilat With Pelvis 2-3 Views Right  05/26/2014   CLINICAL DATA:  Pain following fall  EXAM: DG HIP W/ PELVIS 2-3V*R*  COMPARISON:  None.  FINDINGS: Frontal pelvis as well as frontal and lateral right hip images were obtained. There is a comminuted  intertrochanteric femur fracture on the right with marked varus angulation at the fracture site. There are several displaced fracture fragments in this area. There is avulsion of the lesser trochanter on the right.  No other fractures. No dislocation. There is mild symmetric narrowing of both hip joints. Bones are osteoporotic.  IMPRESSION: Comminuted intertrochanteric femur fracture on the right with varus angulation of the fracture site. No dislocation.  Bones osteoporotic.   Electronically Signed   By: Lowella Grip M.D.   On: 05/26/2014 20:37    Review of Systems  Respiratory:       History of shortness of breath.  Musculoskeletal: Positive for falls (Fall at home, twice yesterday, hit hit on right side and right hip).       History of DJD of multiple joints.  Endo/Heme/Allergies:       History of osteoporosis   Blood pressure 129/77, pulse 87, temperature 98.4 F (36.9 C), temperature source Oral, resp. rate 20, weight 66.3 kg (146 lb 2.6 oz), SpO2 100 %. Physical Exam  Constitutional: She is oriented to person, place, and time. She appears well-developed and well-nourished.  HENT:  Head: Normocephalic and atraumatic.  Eyes: Conjunctivae and EOM are normal. Pupils are equal, round, and reactive to light.  Neck: Normal range of motion. Neck supple.  Cardiovascular: Normal rate, regular rhythm and intact distal pulses.   Respiratory: Effort normal.  GI: Soft.  Musculoskeletal: She exhibits tenderness (Pain right hip with any movement.  Shortening of the right hip.  NV is intact.).  Neurological: She is alert and oriented to person, place, and time. She has normal reflexes.  Skin: Skin is warm and dry.  Psychiatric: She has a normal mood and affect. Her behavior is normal. Judgment and thought content normal.    Assessment/Plan: Displaced intertrochanteric fracture of the right hip.  For surgery tomorrow morning. Osteoporosis Multiple arthralgias secondary to degenerative joint disease.  Jacorion Klem 05/27/2014, 8:24 AM

## 2014-05-27 NOTE — Care Management Note (Addendum)
    Page 1 of 1   06/01/2014     1:18:46 PM CARE MANAGEMENT NOTE 06/01/2014  Patient:  Alexandra Riley,Alexandra Riley   Account Number:  [id number]  Date Initiated:  05/27/2014  Documentation initiated by:  Kathyrn SheriffHILDRESS,JESSICA  Subjective/Objective Assessment:   Pt is from home with self care. Pt lives alone but has strong family support. Pt admitted for hip fx and will have surgery on 1/28.     Action/Plan:   Pending PT eval but plan for discharge to SNF. CSW aware of discharge plan and will arrange for placement. Will continue to follow.   Anticipated DC Date:  05/31/2014   Anticipated DC Plan:  SKILLED NURSING FACILITY  In-house referral  Clinical Social Worker      DC Planning Services  CM consult      Choice offered to / List presented to:             Status of service:  Completed, signed off Medicare Important Message given?  YES (If response is "NO", the following Medicare IM given date fields will be blank) Date Medicare IM given:  05/29/2014 Medicare IM given by:  Kathyrn SheriffHILDRESS,JESSICA Date Additional Medicare IM given:  06/01/2014 Additional Medicare IM given by:  Sharrie RothmanAMMY C Robina Hamor  Discharge Disposition:  SKILLED NURSING FACILITY  Per UR Regulation:    If discussed at Long Length of Stay Meetings, dates discussed:    Comments:  06/01/14 1315 Arlyss Queenammy Megen Madewell, RN BSN CM Pt discharged to [facility] today. CSW to arrange discharge to facility.  05/29/2014 1130 Kathyrn SheriffJessica Childress, RN, MSN, CM Pt will dishcarge to SNF. CSW aware and will arrange for placement. No CM needs at this time. 05/27/2014 1040 Kathyrn SheriffJessica Childress, RN, MSN, CM

## 2014-05-27 NOTE — Progress Notes (Signed)
Patient c/o gas.  Called Dr. Janna Archondiego, MD on call.  Received an order for simethicone chewable tablet.  Administered to patient.  Will continue to monitor patient.

## 2014-05-27 NOTE — Progress Notes (Signed)
UR completed 

## 2014-05-27 NOTE — Progress Notes (Signed)
Subjective: The patient was admitted following a fall at home. She was seen in emergency at [facility]. X-rays reveal intertrochanteric fracture right femur. She is fairly uncomfortable  Objective: Vital signs in last 24 hours: Temp:  [97.5 F (36.4 C)-98.4 F (36.9 C)] 98.4 F (36.9 C) (01/27 0539) Pulse Rate:  [81-95] 87 (01/27 0539) Resp:  [20-24] 20 (01/27 0539) BP: (129-183)/(66-87) 129/77 mmHg (01/27 0539) SpO2:  [96 %-100 %] 100 % (01/27 0539) Weight:  [66.3 kg (146 lb 2.6 oz)] 66.3 kg (146 lb 2.6 oz) (01/26 2317) Weight change:  Last BM Date: 05/26/14  Intake/Output from previous day: 01/26 0701 - 01/27 0700 In: -  Out: 1175 [Urine:1175] Intake/Output this shift: Total I/O In: -  Out: 1175 [Urine:1175]  Physical Exam: Gen. appearance the patient is alert but fairly uncomfortable  HEENT negative  Neck supple no JVD or thyroid abnormalities  Heart regular rhythm no murmurs  Lungs clear to P&A  Abdomen no palpable organs or masses extremities tenderness over the right hip   Recent Labs  05/26/14 1940  WBC 5.8  HGB 12.9  HCT 40.4  PLT 195   BMET  Recent Labs  05/26/14 1940  NA 141  K 3.3*  CL 109  CO2 27  GLUCOSE 145*  BUN 16  CREATININE 0.96  CALCIUM 9.6    Studies/Results: Dg Chest 1 View  05/26/2014   CLINICAL DATA:  Patient fell earlier in the day  EXAM: CHEST - 1 VIEW  COMPARISON:  Sep 19, 2013  FINDINGS: There is a calcified granuloma in the left base. No edema or consolidation. Heart is upper normal in size with pulmonary vascularity within normal limits. There is atherosclerotic change in the aorta. No pneumothorax. No acute fractures are evident on this study  IMPRESSION: No edema or consolidation. Granuloma left base. No apparent pneumothorax.   Electronically Signed   By: Bretta Bang M.D.   On: 05/26/2014 20:36   Ct Head Wo Contrast  05/26/2014   CLINICAL DATA:  Fall today, right hip pain  EXAM: CT HEAD WITHOUT CONTRAST   CT CERVICAL SPINE WITHOUT CONTRAST  TECHNIQUE: Multidetector CT imaging of the head and cervical spine was performed following the standard protocol without intravenous contrast. Multiplanar CT image reconstructions of the cervical spine were also generated.  COMPARISON:  09/19/2013  FINDINGS: CT HEAD FINDINGS  No skull fracture is noted. Paranasal sinuses and mastoid air cells are unremarkable. Stable cerebral atrophy. No intracranial hemorrhage, mass effect or midline shift. No acute cortical infarction. Stable periventricular and patchy subcortical chronic white matter disease. No mass lesion is noted on this unenhanced scan.  CT CERVICAL SPINE FINDINGS  Axial images of the cervical spine shows no acute fracture or subluxation. Computer processed images shows no acute fracture or subluxation. There is diffuse osteopenia. Mild degenerative changes C1-C2 articulation. Mild anterior spurring lower endplate of C4 and C5 vertebral bodies. No prevertebral soft tissue swelling. Cervical airway is patent. There is no pneumothorax in visualized lung apices. Bilateral apical scarring. Atherosclerotic calcifications are noted bilateral carotid bifurcation.  IMPRESSION: 1. No acute intracranial abnormality. Stable atrophy and chronic white matter disease. 2. No cervical spine acute fracture or subluxation. Minimal degenerative changes.   Electronically Signed   By: Natasha Mead M.D.   On: 05/26/2014 20:58   Ct Cervical Spine Wo Contrast  05/26/2014   CLINICAL DATA:  Fall today, right hip pain  EXAM: CT HEAD WITHOUT CONTRAST  CT CERVICAL SPINE WITHOUT CONTRAST  TECHNIQUE:  Multidetector CT imaging of the head and cervical spine was performed following the standard protocol without intravenous contrast. Multiplanar CT image reconstructions of the cervical spine were also generated.  COMPARISON:  09/19/2013  FINDINGS: CT HEAD FINDINGS  No skull fracture is noted. Paranasal sinuses and mastoid air cells are unremarkable. Stable  cerebral atrophy. No intracranial hemorrhage, mass effect or midline shift. No acute cortical infarction. Stable periventricular and patchy subcortical chronic white matter disease. No mass lesion is noted on this unenhanced scan.  CT CERVICAL SPINE FINDINGS  Axial images of the cervical spine shows no acute fracture or subluxation. Computer processed images shows no acute fracture or subluxation. There is diffuse osteopenia. Mild degenerative changes C1-C2 articulation. Mild anterior spurring lower endplate of C4 and C5 vertebral bodies. No prevertebral soft tissue swelling. Cervical airway is patent. There is no pneumothorax in visualized lung apices. Bilateral apical scarring. Atherosclerotic calcifications are noted bilateral carotid bifurcation.  IMPRESSION: 1. No acute intracranial abnormality. Stable atrophy and chronic white matter disease. 2. No cervical spine acute fracture or subluxation. Minimal degenerative changes.   Electronically Signed   By: Natasha MeadLiviu  Pop M.D.   On: 05/26/2014 20:58   Dg Hip Unilat With Pelvis 2-3 Views Right  05/26/2014   CLINICAL DATA:  Pain following fall  EXAM: DG HIP W/ PELVIS 2-3V*R*  COMPARISON:  None.  FINDINGS: Frontal pelvis as well as frontal and lateral right hip images were obtained. There is a comminuted intertrochanteric femur fracture on the right with marked varus angulation at the fracture site. There are several displaced fracture fragments in this area. There is avulsion of the lesser trochanter on the right.  No other fractures. No dislocation. There is mild symmetric narrowing of both hip joints. Bones are osteoporotic.  IMPRESSION: Comminuted intertrochanteric femur fracture on the right with varus angulation of the fracture site. No dislocation. Bones osteoporotic.   Electronically Signed   By: Bretta BangWilliam  Woodruff M.D.   On: 05/26/2014 20:37    Medications:  . aspirin  81 mg Oral Daily  . atorvastatin  10 mg Oral q1800  . clonazePAM  0.5 mg Oral QHS  .  heparin  5,000 Units Subcutaneous 3 times per day    . 0.9 % NaCl with KCl 20 mEq / L 75 mL/hr at 05/26/14 2154     Assessment/Plan: 1. Intertrochanteric fracture right hip-plan orthopedic consult and plans. Possible surgery-pain control  2. Hyperlipidemia-continue statin  3. Hypokalemia Will replete and repeat chemistries   LOS: 1 day   Shray Hunley G 05/27/2014, 6:00 AM

## 2014-05-28 ENCOUNTER — Inpatient Hospital Stay (HOSPITAL_COMMUNITY): Payer: Medicare Other | Admitting: Anesthesiology

## 2014-05-28 ENCOUNTER — Encounter (HOSPITAL_COMMUNITY)
Admission: EM | Disposition: A | Discharge status: Skilled Nursing Facility | Payer: Self-pay | Source: Home / Self Care | Attending: Family Medicine

## 2014-05-28 ENCOUNTER — Encounter (HOSPITAL_COMMUNITY): Payer: Self-pay | Admitting: *Deleted

## 2014-05-28 ENCOUNTER — Inpatient Hospital Stay (HOSPITAL_COMMUNITY): Payer: Medicare Other

## 2014-05-28 HISTORY — PX: ORIF HIP FRACTURE: SHX2125

## 2014-05-28 LAB — CBC WITH DIFFERENTIAL/PLATELET
BASOS ABS: 0 10*3/uL (ref 0.0–0.1)
BASOS PCT: 0 % (ref 0–1)
EOS ABS: 0 10*3/uL (ref 0.0–0.7)
Eosinophils Relative: 0 % (ref 0–5)
HCT: 34.5 % — ABNORMAL LOW (ref 36.0–46.0)
Hemoglobin: 11 g/dL — ABNORMAL LOW (ref 12.0–15.0)
Lymphocytes Relative: 14 % (ref 12–46)
Lymphs Abs: 1.7 10*3/uL (ref 0.7–4.0)
MCH: 29.7 pg (ref 26.0–34.0)
MCHC: 31.9 g/dL (ref 30.0–36.0)
MCV: 93.2 fL (ref 78.0–100.0)
Monocytes Absolute: 1.5 10*3/uL — ABNORMAL HIGH (ref 0.1–1.0)
Monocytes Relative: 12 % (ref 3–12)
Neutro Abs: 8.9 10*3/uL — ABNORMAL HIGH (ref 1.7–7.7)
Neutrophils Relative %: 74 % (ref 43–77)
Platelets: 173 10*3/uL (ref 150–400)
RBC: 3.7 MIL/uL — ABNORMAL LOW (ref 3.87–5.11)
RDW: 13.5 % (ref 11.5–15.5)
WBC: 12.1 10*3/uL — ABNORMAL HIGH (ref 4.0–10.5)

## 2014-05-28 LAB — COMPREHENSIVE METABOLIC PANEL
ALT: 16 U/L (ref 0–35)
AST: 20 U/L (ref 0–37)
Albumin: 3.3 g/dL — ABNORMAL LOW (ref 3.5–5.2)
Alkaline Phosphatase: 48 U/L (ref 39–117)
Anion gap: 6 (ref 5–15)
BILIRUBIN TOTAL: 0.6 mg/dL (ref 0.3–1.2)
BUN: 9 mg/dL (ref 6–23)
CHLORIDE: 108 mmol/L (ref 96–112)
CO2: 23 mmol/L (ref 19–32)
CREATININE: 0.59 mg/dL (ref 0.50–1.10)
Calcium: 8.8 mg/dL (ref 8.4–10.5)
GFR calc Af Amer: >90 mL/min (ref >90)
GFR calc non Af Amer: 78 mL/min — ABNORMAL LOW (ref >90)
Glucose, Bld: 128 mg/dL — ABNORMAL HIGH (ref 70–99)
POTASSIUM: 4.2 mmol/L (ref 3.5–5.1)
Sodium: 137 mmol/L (ref 135–145)
Total Protein: 6 g/dL (ref 6.0–8.3)

## 2014-05-28 LAB — VITAMIN D 25 HYDROXY (VIT D DEFICIENCY, FRACTURES): VIT D 25 HYDROXY: 12.7 ng/mL — AB (ref 30.0–100.0)

## 2014-05-28 LAB — BASIC METABOLIC PANEL
ANION GAP: 4 — AB (ref 5–15)
BUN: 9 mg/dL (ref 6–23)
CHLORIDE: 109 mmol/L (ref 96–112)
CO2: 24 mmol/L (ref 19–32)
Calcium: 8.6 mg/dL (ref 8.4–10.5)
Creatinine, Ser: 0.63 mg/dL (ref 0.50–1.10)
GFR calc Af Amer: 89 mL/min — ABNORMAL LOW (ref >90)
GFR calc non Af Amer: 77 mL/min — ABNORMAL LOW (ref >90)
Glucose, Bld: 141 mg/dL — ABNORMAL HIGH (ref 70–99)
POTASSIUM: 3.8 mmol/L (ref 3.5–5.1)
Sodium: 137 mmol/L (ref 135–145)

## 2014-05-28 SURGERY — OPEN REDUCTION INTERNAL FIXATION HIP
Anesthesia: Spinal | Site: Hip | Laterality: Right

## 2014-05-28 MED ORDER — PROPOFOL 10 MG/ML IV BOLUS
INTRAVENOUS | Status: AC
  Filled 2014-05-28: qty 20

## 2014-05-28 MED ORDER — FENTANYL CITRATE 0.05 MG/ML IJ SOLN
INTRAMUSCULAR | Status: DC | PRN
  Administered 2014-05-28: 12.5 ug via INTRAVENOUS
  Administered 2014-05-28: 20 ug via INTRATHECAL
  Administered 2014-05-28 (×3): 12.5 ug via INTRAVENOUS
  Administered 2014-05-28: 30 ug via INTRAVENOUS

## 2014-05-28 MED ORDER — PROPOFOL INFUSION 10 MG/ML OPTIME
INTRAVENOUS | Status: DC | PRN
  Administered 2014-05-28: 15 ug/kg/min via INTRAVENOUS
  Administered 2014-05-28: 50 ug/kg/min via INTRAVENOUS
  Administered 2014-05-28: 12:00:00 via INTRAVENOUS

## 2014-05-28 MED ORDER — PROMETHAZINE HCL 25 MG/ML IJ SOLN
12.5000 mg | INTRAMUSCULAR | Status: DC | PRN
  Administered 2014-05-28 – 2014-05-29 (×3): 12.5 mg via INTRAVENOUS
  Filled 2014-05-28 (×3): qty 1

## 2014-05-28 MED ORDER — FENTANYL CITRATE 0.05 MG/ML IJ SOLN: 25.0000 ug | INTRAMUSCULAR | Status: DC | PRN

## 2014-05-28 MED ORDER — EPHEDRINE SULFATE 50 MG/ML IJ SOLN
INTRAMUSCULAR | Status: DC | PRN
  Administered 2014-05-28: 10 mg via INTRAVENOUS

## 2014-05-28 MED ORDER — HYDROCODONE-ACETAMINOPHEN 7.5-325 MG PO TABS
1.0000 | ORAL_TABLET | ORAL | Status: DC
  Administered 2014-05-28 – 2014-06-01 (×20): 1 via ORAL
  Filled 2014-05-28 (×25): qty 1

## 2014-05-28 MED ORDER — MIDAZOLAM HCL 2 MG/2ML IJ SOLN
1.0000 mg | INTRAMUSCULAR | Status: DC | PRN
  Administered 2014-05-28: 2 mg via INTRAVENOUS

## 2014-05-28 MED ORDER — ONDANSETRON HCL 4 MG/2ML IJ SOLN: 4.0000 mg | Freq: Once | INTRAMUSCULAR | Status: DC | PRN

## 2014-05-28 MED ORDER — ENOXAPARIN SODIUM 40 MG/0.4ML ~~LOC~~ SOLN
40.0000 mg | SUBCUTANEOUS | Status: DC
  Administered 2014-05-29 – 2014-06-01 (×4): 40 mg via SUBCUTANEOUS
  Filled 2014-05-28 (×4): qty 0.4

## 2014-05-28 MED ORDER — CARISOPRODOL 350 MG PO TABS
350.0000 mg | ORAL_TABLET | Freq: Every day | ORAL | Status: DC
  Administered 2014-05-28 – 2014-05-31 (×4): 350 mg via ORAL
  Filled 2014-05-28 (×4): qty 1

## 2014-05-28 MED ORDER — MIDAZOLAM HCL 2 MG/2ML IJ SOLN
INTRAMUSCULAR | Status: AC
  Filled 2014-05-28: qty 2

## 2014-05-28 MED ORDER — SODIUM CHLORIDE 0.9 % IR SOLN
Status: DC | PRN
  Administered 2014-05-28: 1000 mL

## 2014-05-28 MED ORDER — HYDROGEN PEROXIDE 3 % EX SOLN
CUTANEOUS | Status: DC | PRN
  Administered 2014-05-28: 1

## 2014-05-28 MED ORDER — BUPIVACAINE IN DEXTROSE 0.75-8.25 % IT SOLN
INTRATHECAL | Status: AC
  Filled 2014-05-28: qty 2

## 2014-05-28 MED ORDER — LACTATED RINGERS IV SOLN
INTRAVENOUS | Status: DC
  Administered 2014-05-28: 1000 mL via INTRAVENOUS

## 2014-05-28 MED ORDER — CEFAZOLIN SODIUM-DEXTROSE 2-3 GM-% IV SOLR
2.0000 g | Freq: Once | INTRAVENOUS | Status: AC
  Administered 2014-05-28: 2 g via INTRAVENOUS
  Filled 2014-05-28: qty 50

## 2014-05-28 MED ORDER — FENTANYL CITRATE 0.05 MG/ML IJ SOLN
25.0000 ug | INTRAMUSCULAR | Status: AC
  Administered 2014-05-28 (×2): 25 ug via INTRAVENOUS

## 2014-05-28 MED ORDER — PROPOFOL 10 MG/ML IV BOLUS
INTRAVENOUS | Status: DC | PRN
  Administered 2014-05-28 (×3): 10 mg via INTRAVENOUS

## 2014-05-28 MED ORDER — LACTATED RINGERS IV SOLN
INTRAVENOUS | Status: DC | PRN
  Administered 2014-05-28 (×2): via INTRAVENOUS

## 2014-05-28 MED ORDER — PROMETHAZINE HCL 25 MG/ML IJ SOLN: 12.5000 mg | INTRAMUSCULAR | Status: DC | PRN

## 2014-05-28 MED ORDER — PHENYLEPHRINE HCL 10 MG/ML IJ SOLN
INTRAMUSCULAR | Status: DC | PRN
  Administered 2014-05-28: 50 ug via INTRAVENOUS
  Administered 2014-05-28: 100 ug via INTRAVENOUS
  Administered 2014-05-28: 50 ug via INTRAVENOUS
  Administered 2014-05-28 (×3): 100 ug via INTRAVENOUS
  Administered 2014-05-28: 50 ug via INTRAVENOUS
  Administered 2014-05-28: 100 ug via INTRAVENOUS

## 2014-05-28 MED ORDER — PHENYLEPHRINE HCL 10 MG/ML IJ SOLN
INTRAMUSCULAR | Status: AC
Start: 2014-05-28 — End: 2014-05-28
  Filled 2014-05-28: qty 1

## 2014-05-28 MED ORDER — CHLORHEXIDINE GLUCONATE CLOTH 2 % EX PADS
6.0000 | MEDICATED_PAD | Freq: Once | CUTANEOUS | Status: AC
  Administered 2014-05-28: 6 via TOPICAL

## 2014-05-28 MED ORDER — FENTANYL CITRATE 0.05 MG/ML IJ SOLN
INTRAMUSCULAR | Status: AC
  Filled 2014-05-28: qty 2

## 2014-05-28 MED ORDER — SODIUM CHLORIDE 0.9 % IJ SOLN
INTRAMUSCULAR | Status: AC
  Filled 2014-05-28: qty 20

## 2014-05-28 MED ORDER — BUPIVACAINE IN DEXTROSE 0.75-8.25 % IT SOLN
INTRATHECAL | Status: DC | PRN
  Administered 2014-05-28: 14 mg via INTRATHECAL

## 2014-05-28 MED ORDER — ALUM & MAG HYDROXIDE-SIMETH 200-200-20 MG/5ML PO SUSP
15.0000 mL | ORAL | Status: DC | PRN
  Administered 2014-05-28: 15 mL via ORAL
  Filled 2014-05-28: qty 30

## 2014-05-28 MED ORDER — LORATADINE 10 MG PO TABS: 10.0000 mg | ORAL_TABLET | Freq: Every day | ORAL | Status: DC | PRN

## 2014-05-28 SURGICAL SUPPLY — 52 items
BAG HAMPER (MISCELLANEOUS) ×3 IMPLANT
BAG URINE DRAINAGE (UROLOGICAL SUPPLIES) ×2 IMPLANT
BIT DRILL TWIST 3.5MM (BIT) ×1 IMPLANT
BLADE 10 SAFETY STRL DISP (BLADE) ×6 IMPLANT
BLADE SURG SZ20 CARB STEEL (BLADE) ×3 IMPLANT
CLOTH BEACON ORANGE TIMEOUT ST (SAFETY) ×3 IMPLANT
COVER LIGHT HANDLE STERIS (MISCELLANEOUS) ×6 IMPLANT
COVER MAYO STAND XLG (DRAPE) ×3 IMPLANT
DRAPE STERI IOBAN 125X83 (DRAPES) ×3 IMPLANT
DRILL TWIST 3.5MM (BIT) ×3
ELECT REM PT RETURN 9FT ADLT (ELECTROSURGICAL) ×3
ELECTRODE REM PT RTRN 9FT ADLT (ELECTROSURGICAL) ×1 IMPLANT
EVACUATOR 3/16  PVC DRAIN (DRAIN) ×2
EVACUATOR 3/16 PVC DRAIN (DRAIN) ×1 IMPLANT
GAUZE SPONGE 4X4 12PLY STRL (GAUZE/BANDAGES/DRESSINGS) ×3 IMPLANT
GAUZE XEROFORM 5X9 LF (GAUZE/BANDAGES/DRESSINGS) ×3 IMPLANT
GLOVE BIO SURGEON STRL SZ8 (GLOVE) ×3 IMPLANT
GLOVE BIO SURGEON STRL SZ8.5 (GLOVE) ×3 IMPLANT
GOWN STRL REUS W/TWL LRG LVL3 (GOWN DISPOSABLE) ×6 IMPLANT
GOWN STRL REUS W/TWL XL LVL3 (GOWN DISPOSABLE) ×3 IMPLANT
GUIDE PIN CALIBRATED (PIN) ×3 IMPLANT
GUIDE PIN CALIBRATED 2.4X23 (PIN) ×1 IMPLANT
INST SET MAJOR BONE (KITS) ×3 IMPLANT
KIT BLADEGUARD II DBL (SET/KITS/TRAYS/PACK) ×3 IMPLANT
KIT ROOM TURNOVER AP CYSTO (KITS) ×3 IMPLANT
MANIFOLD NEPTUNE II (INSTRUMENTS) ×3 IMPLANT
MARKER SKIN DUAL TIP RULER LAB (MISCELLANEOUS) ×3 IMPLANT
NS IRRIG 1000ML POUR BTL (IV SOLUTION) ×3 IMPLANT
PACK BASIC III (CUSTOM PROCEDURE TRAY) ×3
PACK SRG BSC III STRL LF ECLPS (CUSTOM PROCEDURE TRAY) ×1 IMPLANT
PAD ABD 5X9 TENDERSORB (GAUZE/BANDAGES/DRESSINGS) ×3 IMPLANT
PAD ARMBOARD 7.5X6 YLW CONV (MISCELLANEOUS) ×3 IMPLANT
PENCIL HANDSWITCHING (ELECTRODE) ×3 IMPLANT
PLATE SHORT BARRELL 145X4 (Plate) ×2 IMPLANT
SCREW CORTICAL SFTP 4.5X36MM (Screw) ×2 IMPLANT
SCREW CORTICAL SFTP 4.5X38MM (Screw) ×2 IMPLANT
SCREW CORTICAL SFTP 4.5X40MM (Screw) ×2 IMPLANT
SCREW CORTICAL SFTP 4.5X44MM (Screw) ×2 IMPLANT
SCREW LAG 85MM (Screw) ×3 IMPLANT
SCREW LAGSTD 85X21X12.7X9 (Screw) IMPLANT
SET BASIN LINEN APH (SET/KITS/TRAYS/PACK) ×3 IMPLANT
SPONGE DRAIN TRACH 4X4 STRL 2S (GAUZE/BANDAGES/DRESSINGS) ×2 IMPLANT
SPONGE GAUZE 4X4 12PLY (GAUZE/BANDAGES/DRESSINGS) ×1 IMPLANT
SPONGE LAP 18X18 X RAY DECT (DISPOSABLE) ×6 IMPLANT
STAPLER VISISTAT 35W (STAPLE) ×3 IMPLANT
SUT BRALON NAB BRD #1 30IN (SUTURE) ×10 IMPLANT
SUT PLAIN 2 0 XLH (SUTURE) ×3 IMPLANT
SUT SILK 0 FSL (SUTURE) ×3 IMPLANT
SYR BULB IRRIGATION 50ML (SYRINGE) ×3 IMPLANT
TAPE CLOTH SURG 4X10 WHT LF (GAUZE/BANDAGES/DRESSINGS) ×2 IMPLANT
TAPE MEDIFIX FOAM 3 (GAUZE/BANDAGES/DRESSINGS) ×3 IMPLANT
YANKAUER SUCT 12FT TUBE ARGYLE (SUCTIONS) ×3 IMPLANT

## 2014-05-28 NOTE — Anesthesia Postprocedure Evaluation (Signed)
  Anesthesia Post-op Note  Patient: Virgina EvenerRuth W Fedrick  Procedure(s) Performed: Procedure(s): OPEN REDUCTION INTERNAL FIXATION HIP (Right)  Patient Location: PACU  Anesthesia Type:Spinal  Level of Consciousness: awake, alert  and patient cooperative  Airway and Oxygen Therapy: Patient Spontanous Breathing and Patient connected to nasal cannula oxygen  Post-op Pain: 3 /10, mild  Post-op Assessment: Post-op Vital signs reviewed, Patient's Cardiovascular Status Stable, Respiratory Function Stable, Patent Airway, No signs of Nausea or vomiting, Pain level controlled and No headache  Post-op Vital Signs: Reviewed and stable  Last Vitals:  Filed Vitals:   05/28/14 1223  BP: 90/37  Pulse: 88  Temp: 36.6 C  Resp: 18    Complications: No apparent anesthesia complications

## 2014-05-28 NOTE — Anesthesia Procedure Notes (Addendum)
Date/Time: 05/28/2014 10:20 AM Performed by: Alexandra DupreADAMS, Alexandra Kisiel Riley Pre-anesthesia Checklist: Patient identified, Timeout performed, Emergency Drugs available, Suction available and Patient being monitored Oxygen Delivery Method: Simple face mask    Spinal Patient location during procedure: OR Start time: 05/28/2014 10:43 AM Staffing Resident/CRNA: Alexandra Riley Preanesthetic Checklist Completed: patient identified, site marked, surgical consent, pre-op evaluation, timeout performed, IV checked, risks and benefits discussed and monitors and equipment checked Spinal Block Patient position: right lateral decubitus Prep: Betadine Patient monitoring: heart rate, cardiac monitor, continuous pulse ox and blood pressure Approach: right paramedian Location: L3-4 Injection technique: single-shot Needle Needle type: Spinocan  Needle gauge: 22 G Needle length: 9 cm Assessment Sensory level: T8 Additional Notes ATTEMPTS:1 TRAY [id number] TRAY EXPIRATION DATE:06/2015

## 2014-05-28 NOTE — Brief Op Note (Signed)
05/26/2014 - 05/28/2014  12:09 PM  PATIENT:  Syliva Overmanuth W Yoak  81 y.o. female  PRE-OPERATIVE DIAGNOSIS:  Intertrochantric Fracture Right Hip  POST-OPERATIVE DIAGNOSIS:  Intertrochantric Fracture Right Hip  PROCEDURE:  Procedure(s): OPEN REDUCTION INTERNAL FIXATION HIP (Right)  SURGEON:  Surgeon(s) and Role:    * Darreld McleanWayne Sonya Gunnoe, MD - Primary  PHYSICIAN ASSISTANT:   ASSISTANTS: B. Ashley   ANESTHESIA:   spinal  EBL:  Total I/O In: 1682 [I.V.:1682] Out: 1650 [Urine:1350; Blood:300]  BLOOD ADMINISTERED:none  DRAINS: (Large) Hemovact drain(s) in the right hip area with  Suction Open   LOCAL MEDICATIONS USED:  NONE  SPECIMEN:  No Specimen  DISPOSITION OF SPECIMEN:  N/A  COUNTS:  YES  TOURNIQUET:  * No tourniquets in log *  DICTATION: .Other Dictation: Dictation Number [id number]  PLAN OF CARE: Admit to inpatient   PATIENT DISPOSITION:  PACU - hemodynamically stable.   Delay start of Pharmacological VTE agent (>24hrs) due to surgical blood loss or risk of bleeding: no

## 2014-05-28 NOTE — Transfer of Care (Signed)
Immediate Anesthesia Transfer of Care Note  Patient: Alexandra Riley  Procedure(s) Performed: Procedure(s): OPEN REDUCTION INTERNAL FIXATION HIP (Right)  Patient Location: PACU  Anesthesia Type:Spinal  Level of Consciousness: awake, alert , oriented and patient cooperative  Airway & Oxygen Therapy: Patient Spontanous Breathing  Post-op Assessment: Report given to PACU RN and Post -op Vital signs reviewed and stable  Post vital signs: Reviewed and stable  Last Vitals:  Filed Vitals:   05/28/14 1015  BP: 146/55  Pulse:   Temp:   Resp: 17    Complications: No apparent anesthesia complications

## 2014-05-28 NOTE — Anesthesia Preprocedure Evaluation (Signed)
Anesthesia Evaluation  Patient identified by MRN, date of birth, ID band Patient awake    Reviewed: Allergy & Precautions, H&P , NPO status , Patient's Chart, lab work & pertinent test results  Airway Mallampati: II       Dental  (+) Edentulous Upper, Edentulous Lower   Pulmonary shortness of breath and with exertion,  breath sounds clear to auscultation        Cardiovascular negative cardio ROS  Rhythm:Regular Rate:Normal     Neuro/Psych Anxiety    GI/Hepatic negative GI ROS,   Endo/Other    Renal/GU      Musculoskeletal   Abdominal   Peds  Hematology   Anesthesia Other Findings   Reproductive/Obstetrics                             Anesthesia Physical Anesthesia Plan  ASA: III  Anesthesia Plan: Spinal   Post-op Pain Management:    Induction:   Airway Management Planned: Simple Face Mask  Additional Equipment:   Intra-op Plan:   Post-operative Plan:   Informed Consent: I have reviewed the patients History and Physical, chart, labs and discussed the procedure including the risks, benefits and alternatives for the proposed anesthesia with the patient or authorized representative who has indicated his/her understanding and acceptance.     Plan Discussed with:   Anesthesia Plan Comments:         Anesthesia Quick Evaluation

## 2014-05-28 NOTE — Progress Notes (Signed)
Subjective: The patient had a fairly comfortable night. She continues to have some pain in the area of the right hip. She did have a fall at home and fractured right femur is scheduled for surgery today  Objective: Vital signs in last 24 hours: Temp:  [98.8 F (37.1 C)-99 F (37.2 C)] 98.8 F (37.1 C) (01/27 2201) Pulse Rate:  [79-87] 79 (01/27 2201) Resp:  [20] 20 (01/27 1404) BP: (161-169)/(65) 161/65 mmHg (01/27 2201) SpO2:  [96 %] 96 % (01/27 2201) Weight change:  Last BM Date: 05/26/14  Intake/Output from previous day: 01/27 0701 - 01/28 0700 In: 300 [P.O.:300] Out: 1300 [Urine:1300] Intake/Output this shift:    Physical Exam: Gen. appearance the patient is alert and fairly comfortable  HEENT negative  Neck supple no JVD or thyroid abnormalities  Heart regular rhythm no murmurs  Lungs clear to P&A  Abdomen no palpable organs or masses  Extremities tenderness over the right hip   Recent Labs  05/26/14 1940 05/27/14 0615  WBC 5.8 10.9*  HGB 12.9 11.5*  HCT 40.4 36.0  PLT 195 197   BMET  Recent Labs  05/27/14 0615 05/28/14 0020  NA 141 137  K 4.2 3.8  CL 113* 109  CO2 23 24  GLUCOSE 142* 141*  BUN 12 9  CREATININE 0.72 0.63  CALCIUM 8.5 8.6    Studies/Results: Dg Chest 1 View  05/26/2014   CLINICAL DATA:  Patient fell earlier in the day  EXAM: CHEST - 1 VIEW  COMPARISON:  Sep 19, 2013  FINDINGS: There is a calcified granuloma in the left base. No edema or consolidation. Heart is upper normal in size with pulmonary vascularity within normal limits. There is atherosclerotic change in the aorta. No pneumothorax. No acute fractures are evident on this study  IMPRESSION: No edema or consolidation. Granuloma left base. No apparent pneumothorax.   Electronically Signed   By: Bretta BangWilliam  Woodruff M.D.   On: 05/26/2014 20:36   Ct Head Wo Contrast  05/26/2014   CLINICAL DATA:  Fall today, right hip pain  EXAM: CT HEAD WITHOUT CONTRAST  CT CERVICAL SPINE  WITHOUT CONTRAST  TECHNIQUE: Multidetector CT imaging of the head and cervical spine was performed following the standard protocol without intravenous contrast. Multiplanar CT image reconstructions of the cervical spine were also generated.  COMPARISON:  09/19/2013  FINDINGS: CT HEAD FINDINGS  No skull fracture is noted. Paranasal sinuses and mastoid air cells are unremarkable. Stable cerebral atrophy. No intracranial hemorrhage, mass effect or midline shift. No acute cortical infarction. Stable periventricular and patchy subcortical chronic white matter disease. No mass lesion is noted on this unenhanced scan.  CT CERVICAL SPINE FINDINGS  Axial images of the cervical spine shows no acute fracture or subluxation. Computer processed images shows no acute fracture or subluxation. There is diffuse osteopenia. Mild degenerative changes C1-C2 articulation. Mild anterior spurring lower endplate of C4 and C5 vertebral bodies. No prevertebral soft tissue swelling. Cervical airway is patent. There is no pneumothorax in visualized lung apices. Bilateral apical scarring. Atherosclerotic calcifications are noted bilateral carotid bifurcation.  IMPRESSION: 1. No acute intracranial abnormality. Stable atrophy and chronic white matter disease. 2. No cervical spine acute fracture or subluxation. Minimal degenerative changes.   Electronically Signed   By: Natasha MeadLiviu  Pop M.D.   On: 05/26/2014 20:58   Ct Cervical Spine Wo Contrast  05/26/2014   CLINICAL DATA:  Fall today, right hip pain  EXAM: CT HEAD WITHOUT CONTRAST  CT CERVICAL SPINE WITHOUT CONTRAST  TECHNIQUE: Multidetector CT imaging of the head and cervical spine was performed following the standard protocol without intravenous contrast. Multiplanar CT image reconstructions of the cervical spine were also generated.  COMPARISON:  09/19/2013  FINDINGS: CT HEAD FINDINGS  No skull fracture is noted. Paranasal sinuses and mastoid air cells are unremarkable. Stable cerebral atrophy.  No intracranial hemorrhage, mass effect or midline shift. No acute cortical infarction. Stable periventricular and patchy subcortical chronic white matter disease. No mass lesion is noted on this unenhanced scan.  CT CERVICAL SPINE FINDINGS  Axial images of the cervical spine shows no acute fracture or subluxation. Computer processed images shows no acute fracture or subluxation. There is diffuse osteopenia. Mild degenerative changes C1-C2 articulation. Mild anterior spurring lower endplate of C4 and C5 vertebral bodies. No prevertebral soft tissue swelling. Cervical airway is patent. There is no pneumothorax in visualized lung apices. Bilateral apical scarring. Atherosclerotic calcifications are noted bilateral carotid bifurcation.  IMPRESSION: 1. No acute intracranial abnormality. Stable atrophy and chronic white matter disease. 2. No cervical spine acute fracture or subluxation. Minimal degenerative changes.   Electronically Signed   By: Natasha Mead M.D.   On: 05/26/2014 20:58   Dg Hip Unilat With Pelvis 2-3 Views Right  05/26/2014   CLINICAL DATA:  Pain following fall  EXAM: DG HIP W/ PELVIS 2-3V*R*  COMPARISON:  None.  FINDINGS: Frontal pelvis as well as frontal and lateral right hip images were obtained. There is a comminuted intertrochanteric femur fracture on the right with marked varus angulation at the fracture site. There are several displaced fracture fragments in this area. There is avulsion of the lesser trochanter on the right.  No other fractures. No dislocation. There is mild symmetric narrowing of both hip joints. Bones are osteoporotic.  IMPRESSION: Comminuted intertrochanteric femur fracture on the right with varus angulation of the fracture site. No dislocation. Bones osteoporotic.   Electronically Signed   By: Bretta Bang M.D.   On: 05/26/2014 20:37    Medications:  . sodium chloride   Intravenous Once  . atorvastatin  10 mg Oral q1800  . clonazePAM  0.5 mg Oral QHS  . heparin   5,000 Units Subcutaneous 3 times per day    . 0.9 % NaCl with KCl 20 mEq / L 75 mL/hr at 05/27/14 2201     Assessment/Plan: 1. Intratrochanteric fracture right hip-patient will be taken to surgery by orthopedic surgeon today  2. Hyperlipidemia-continue statin   LOS: 2 days   Sarrinah Gardin G 05/28/2014, 5:55 AM

## 2014-05-28 NOTE — Anesthesia Postprocedure Evaluation (Signed)
  Anesthesia Post-op Note  Patient: Alexandra EvenerRuth W Levandowski  Procedure(s) Performed: Procedure(s): OPEN REDUCTION INTERNAL FIXATION HIP (Right)  Patient Location: PACU  Anesthesia Type:Spinal  Level of Consciousness: awake, alert , oriented and patient cooperative  Airway and Oxygen Therapy: Patient Spontanous Breathing  Post-op Pain: none  Post-op Assessment: Post-op Vital signs reviewed, Patient's Cardiovascular Status Stable, Respiratory Function Stable, Patent Airway, No signs of Nausea or vomiting, Pain level controlled and No headache  Post-op Vital Signs: Reviewed and stable  Last Vitals:  Filed Vitals:   05/28/14 1315  BP: 103/42  Pulse: 79  Temp: 36.5 C  Resp: 22    Complications: No apparent anesthesia complications

## 2014-05-29 ENCOUNTER — Encounter (HOSPITAL_COMMUNITY): Payer: Self-pay | Admitting: Orthopaedic Surgery

## 2014-05-29 LAB — CBC WITH DIFFERENTIAL/PLATELET
Basophils Absolute: 0 10*3/uL (ref 0.0–0.1)
Basophils Relative: 0 % (ref 0–1)
EOS ABS: 0 10*3/uL (ref 0.0–0.7)
Eosinophils Relative: 0 % (ref 0–5)
HCT: 28.3 % — ABNORMAL LOW (ref 36.0–46.0)
Hemoglobin: 9.2 g/dL — ABNORMAL LOW (ref 12.0–15.0)
LYMPHS ABS: 1 10*3/uL (ref 0.7–4.0)
Lymphocytes Relative: 6 % — ABNORMAL LOW (ref 12–46)
MCH: 30.2 pg (ref 26.0–34.0)
MCHC: 32.5 g/dL (ref 30.0–36.0)
MCV: 92.8 fL (ref 78.0–100.0)
MONOS PCT: 12 % (ref 3–12)
Monocytes Absolute: 2 10*3/uL — ABNORMAL HIGH (ref 0.1–1.0)
NEUTROS PCT: 82 % — AB (ref 43–77)
Neutro Abs: 13.1 10*3/uL — ABNORMAL HIGH (ref 1.7–7.7)
Platelets: 163 10*3/uL (ref 150–400)
RBC: 3.05 MIL/uL — ABNORMAL LOW (ref 3.87–5.11)
RDW: 13.7 % (ref 11.5–15.5)
WBC: 16.1 10*3/uL — AB (ref 4.0–10.5)

## 2014-05-29 LAB — COMPREHENSIVE METABOLIC PANEL
ALT: 16 U/L (ref 0–35)
ANION GAP: 4 — AB (ref 5–15)
AST: 30 U/L (ref 0–37)
Albumin: 2.8 g/dL — ABNORMAL LOW (ref 3.5–5.2)
Alkaline Phosphatase: 40 U/L (ref 39–117)
BILIRUBIN TOTAL: 0.7 mg/dL (ref 0.3–1.2)
BUN: 13 mg/dL (ref 6–23)
CO2: 24 mmol/L (ref 19–32)
CREATININE: 0.66 mg/dL (ref 0.50–1.10)
Calcium: 7.9 mg/dL — ABNORMAL LOW (ref 8.4–10.5)
Chloride: 107 mmol/L (ref 96–112)
GFR, EST AFRICAN AMERICAN: 87 mL/min — AB (ref >90)
GFR, EST NON AFRICAN AMERICAN: 75 mL/min — AB (ref >90)
GLUCOSE: 133 mg/dL — AB (ref 70–99)
POTASSIUM: 4 mmol/L (ref 3.5–5.1)
Sodium: 135 mmol/L (ref 135–145)
Total Protein: 5.6 g/dL — ABNORMAL LOW (ref 6.0–8.3)

## 2014-05-29 LAB — PREPARE RBC (CROSSMATCH)

## 2014-05-29 MED ORDER — SODIUM CHLORIDE 0.9 % IV SOLN
Freq: Once | INTRAVENOUS | Status: AC
  Administered 2014-05-29: 11:00:00 via INTRAVENOUS

## 2014-05-29 MED ORDER — SODIUM CHLORIDE 0.9 % IV SOLN
Freq: Once | INTRAVENOUS | Status: AC
Start: 2014-05-29 — End: 2014-05-30
  Administered 2014-05-30: 03:00:00 via INTRAVENOUS

## 2014-05-29 MED ORDER — ONDANSETRON HCL 4 MG PO TABS
4.0000 mg | ORAL_TABLET | Freq: Four times a day (QID) | ORAL | Status: DC | PRN
  Administered 2014-05-29 – 2014-05-31 (×4): 4 mg via ORAL
  Filled 2014-05-29 (×5): qty 1

## 2014-05-29 NOTE — Progress Notes (Signed)
Physical Therapy Treatment Patient Details Name: Alexandra Riley MRN: [id number] DOB: [birth year] Today's Date: 05/29/2014    History of Present Illness Pt is a 81 year old female who sustained a fall at home and fractured her right hip.  She underwent OTIF of the right hip on 05-28-14.  She lives with family and normally ambulates with no assistive device although does use a walker at times.  Family states that she has some dementia.    PT Comments    Pt was up in chair for about 2 hours, tolerated well.  She is now receiving blood.  She was able to stand to a walker with max assist of 2.  She was instructed in turning her feet trying to maintain TTWB right.  It was extremely slow but she was able to turn with the walker to sit on the bed.  She was transferred to supine with total assist.  No exercise was done due to pain and fatigue.  RN was alerted to pt pain.  Ice was applied to the right hip.  Follow Up Recommendations  SNF     Equipment Recommendations  None recommended by PT    Recommendations for Other Services  none     Precautions / Restrictions Precautions Precautions: Fall Restrictions Weight Bearing Restrictions: Yes RUE Weight Bearing: Touch down weight bearing    Mobility  Bed Mobility Overal bed mobility: Needs Assistance;+2 for physical assistance Bed Mobility: Sit to Supine     Supine to sit: Total assist;+2 for physical assistance;HOB elevated Sit to supine: Total assist   General bed mobility comments: Pt in pain and somewhat agitated so needed more assist  Transfers Overall transfer level: Needs assistance Equipment used: Rolling walker (2 wheeled) Transfers: Stand Pivot Transfers Sit to Stand: +2 physical assistance;Max assist Stand pivot transfers: +2 physical assistance;Max assist       General transfer comment: used a walker to step to chair...pt had significant difficulty moving either foot maintaining TTWB  right  Ambulation/Gait Ambulation/Gait assistance:  (unable)               Stairs            Wheelchair Mobility    Modified Rankin (Stroke Patients Only)       Balance Overall balance assessment: No apparent balance deficits (not formally assessed)                                  Cognition Arousal/Alertness: Awake/alert Behavior During Therapy: WFL for tasks assessed/performed Overall Cognitive Status: Within Functional Limits for tasks assessed                      Exercises General Exercises - Lower Extremity Ankle Circles/Pumps: AAROM;Right;10 reps;Supine Short Arc Quad: AAROM;Right;10 reps;Supine Heel Slides: AAROM;Right;10 reps;Supine Hip ABduction/ADduction: AAROM;Right;10 reps;Supine    General Comments        Pertinent Vitals/Pain Pain Assessment: Faces Faces Pain Scale: Hurts worst Pain Descriptors / Indicators: Aching Pain Intervention(s): Patient requesting pain meds-RN notified;Ice applied    Home Living Family/patient expects to be discharged to:: Skilled nursing facility                    Prior Function Level of Independence: Needs assistance  Gait / Transfers Assistance Needed: transferred and ambulated with no assistance ADL's / Homemaking Assistance Needed: able to dress herself, assist with sponge bath  PT Goals (current goals can now be found in the care plan section) Acute Rehab PT Goals Patient Stated Goal: none PT Goal Formulation: Patient unable to participate in goal setting Time For Goal Achievement: 06/12/14 Potential to Achieve Goals: Good Progress towards PT goals: Progressing toward goals    Frequency  Min 6X/week    PT Plan Current plan remains appropriate    Co-evaluation             End of Session Equipment Utilized During Treatment: Gait belt Activity Tolerance: Patient limited by fatigue;Patient limited by pain Patient left: in bed;with call bell/phone within  reach;with bed alarm set;with nursing/sitter in room     Time: [id number] PT Time Calculation (min) (ACUTE ONLY): 32 min  Charges:  $Therapeutic Exercise: 8-22 mins $Therapeutic Activity: 23-37 mins                    G Codes:      Konrad PentaBrown, Evaluna Utke L 05/29/2014, 1:48 PM

## 2014-05-29 NOTE — Progress Notes (Signed)
Subjective: The patient is alert this morning. She had open reduction and internal fixation of right hip yesterday. Diagnosis intratrochanteric fracture right hip. Apparently tolerated procedure in satisfactory manner  Objective: Vital signs in last 24 hours: Temp:  [97.6 F (36.4 C)-98.9 F (37.2 C)] 98.9 F (37.2 C) (01/29 0540) Pulse Rate:  [76-100] 96 (01/29 0540) Resp:  [14-29] 15 (01/29 0540) BP: (88-165)/(37-72) 114/48 mmHg (01/29 0540) SpO2:  [92 %-100 %] 94 % (01/29 0540) Weight:  [71.7 kg (158 lb 1.1 oz)] 71.7 kg (158 lb 1.1 oz) (01/28 1415) Weight change:  Last BM Date: 05/26/14  Intake/Output from previous day: 01/28 0701 - 01/29 0700 In: 1882 [I.V.:1882] Out: 2440 [Urine:2080; Drains:60; Blood:300] Intake/Output this shift: Total I/O In: -  Out: 300 [Urine:300]  Physical Exam: Gen. appearance patient is alert complains of pain in right hip  HEENT negative  Neck supple no JVD or thyroid abnormalities  Heart regular rhythm no murmurs  Lungs clear to P&A  Abdomen no palpable organs or masses  Extremities surgical incisions right hip   Recent Labs  05/27/14 0615 05/28/14 0553  WBC 10.9* 12.1*  HGB 11.5* 11.0*  HCT 36.0 34.5*  PLT 197 173   BMET  Recent Labs  05/28/14 0020 05/28/14 0553  NA 137 137  K 3.8 4.2  CL 109 108  CO2 24 23  GLUCOSE 141* 128*  BUN 9 9  CREATININE 0.63 0.59  CALCIUM 8.6 8.8    Studies/Results: Dg Hip Operative Unilat With Pelvis Right  05/28/2014   CLINICAL DATA:  RIGHT hip fracture, closed, initial encounter, ORIF  EXAM: OPERATIVE RIGHT HIP WITH PELVIS  COMPARISON:  Three digital C-arm fluoroscopic images obtained intraoperatively are compared to preoperative radiographs of 05/26/2014  FLUOROSCOPY TIME:  0 min 41 seconds  FINDINGS: Lateral plate compression screw placed across a reduced intertrochanteric fracture of the proximal RIGHT femur.  No dislocation.  Bones appear demineralized.  Lesser trochanteric fracture  fragment remains slightly displaced.  Visualized RIGHT pelvis appears intact.  IMPRESSION: Post 0RIF of an intertrochanteric fracture RIGHT femur.   Electronically Signed   By: Ulyses SouthwardMark  Boles M.D.   On: 05/28/2014 12:45    Medications:  . sodium chloride   Intravenous Once  . atorvastatin  10 mg Oral q1800  . carisoprodol  350 mg Oral QHS  . clonazePAM  0.5 mg Oral QHS  . enoxaparin (LOVENOX) injection  40 mg Subcutaneous Q24H  . HYDROcodone-acetaminophen  1 tablet Oral Q4H    . 0.9 % NaCl with KCl 20 mEq / L 75 mL/hr at 05/28/14 1503     Assessment/Plan: 1. Intertrochanteric fracture right hip-1 day postop following open reduction internal fixation right hip. Continue current regimen   LOS: 3 days   Ravin Bendall G 05/29/2014, 6:13 AM

## 2014-05-29 NOTE — Care Management Utilization Note (Signed)
UR completed 

## 2014-05-29 NOTE — Clinical Social Work Placement (Signed)
Clinical Social Work Department CLINICAL SOCIAL WORK PLACEMENT NOTE 05/29/2014  Patient:  Alexandra Riley,Alexandra Riley  Account Number:  [id number] Admit date:  05/26/2014  Clinical Social Worker:  Derenda FennelKARA Delvecchio Madole, LCSW  Date/time:  05/27/2014 09:28 AM  Clinical Social Work is seeking post-discharge placement for this patient at the following level of care:   SKILLED NURSING   (*CSW will update this form in Epic as items are completed)   05/27/2014  [facility]/family provided with [facility] of Clinical Social Work's list of facilities offering this level of care within the geographic area requested by the patient (or if unable, by the patient's family).  05/27/2014  Patient/family informed of their freedom to choose among providers that offer the needed level of care, that participate in Medicare, Medicaid or managed care program needed by the patient, have an available bed and are willing to accept the patient.  05/27/2014  Patient/family informed of [facility]' ownership interest in [facility], as well as of the fact that they are under no obligation to receive care at this facility.  PASARR submitted to EDS on 05/27/2014 PASARR number received on 05/27/2014  FL2 transmitted to all facilities in geographic area requested by pt/family on  05/27/2014 FL2 transmitted to all facilities within larger geographic area on   Patient informed that his/her managed care company has contracts with or will negotiate with  certain facilities, including the following:     Patient/family informed of bed offers received:  05/29/2014 [facility] [place] [facility] and [facility] [place]  Patient to be transferred to  on   Patient to be transferred to facility by  Patient and family notified of transfer on  Name of family member notified:    The following physician request were entered in Epic:   Additional Comments:  Derenda FennelKara  Jamisha Hoeschen, LCSW [phone]

## 2014-05-29 NOTE — Clinical Social Work Note (Signed)
CSW met with pt this morning to discuss SNF further. She defers to her daughter, Russella Dar and said that she will handle it. CSW left voicemail requesting return call several hours ago. Left another voicemail notifying her of bed offer at [facility]. Facility can accept pt over weekend if stable.   Benay Pike, [place]

## 2014-05-29 NOTE — Progress Notes (Signed)
Late entry for 05/29/14 at 2200, Asher MuirJamie from lab called and stated that the screen and type that was ordered from first shift was not sufficient to give the patient her blood, I had to enter new orders to transfuse blood to get the second unit. This was sufficient according to lab and I will be giving the unit of blood once it is prepared and continue to monitor the patient.

## 2014-05-29 NOTE — Progress Notes (Signed)
Subjective: 1 Day Post-Op Procedure(s) (LRB): OPEN REDUCTION INTERNAL FIXATION HIP (Right) Patient reports pain as 5 on 0-10 scale.    Objective: Vital signs in last 24 hours: Temp:  [97.6 F (36.4 C)-98.9 F (37.2 C)] 98.9 F (37.2 C) (01/29 0540) Pulse Rate:  [76-100] 96 (01/29 0540) Resp:  [14-29] 15 (01/29 0540) BP: (88-148)/(37-71) 114/48 mmHg (01/29 0540) SpO2:  [92 %-100 %] 94 % (01/29 0540) Weight:  [71.7 kg (158 lb 1.1 oz)] 71.7 kg (158 lb 1.1 oz) (01/28 1415)  Intake/Output from previous day: 01/28 0701 - 01/29 0700 In: 1882 [I.V.:1882] Out: 2440 [Urine:2080; Drains:60; Blood:300] Intake/Output this shift:     Recent Labs  05/26/14 1940 05/27/14 0615 05/28/14 0553 05/29/14 0533  HGB 12.9 11.5* 11.0* 9.2*    Recent Labs  05/28/14 0553 05/29/14 0533  WBC 12.1* 16.1*  RBC 3.70* 3.05*  HCT 34.5* 28.3*  PLT 173 163    Recent Labs  05/28/14 0553 05/29/14 0533  NA 137 135  K 4.2 4.0  CL 108 107  CO2 23 24  BUN 9 13  CREATININE 0.59 0.66  GLUCOSE 128* 133*  CALCIUM 8.8 7.9*   No results for input(s): LABPT, INR in the last 72 hours.  Neurologically intact Neurovascular intact Sensation intact distally Intact pulses distally Dorsiflexion/Plantar flexion intact   She had a fairly good night.  She has some pain.  Her HGB is 9.2 and I will transfuse today.  Begin PT today.  Assessment/Plan: 1 Day Post-Op Procedure(s) (LRB): OPEN REDUCTION INTERNAL FIXATION HIP (Right) Up with therapy  Randon Somera 05/29/2014, 7:12 AM

## 2014-05-29 NOTE — Progress Notes (Signed)
Blood dual signed off with Benjaman LobeMegan Bullins, RN. Please see blood slip. Blood audit completed.

## 2014-05-29 NOTE — Clinical Social Work Note (Signed)
CSW spoke with Alexandra Riley who accepts bed at [facility].   Derenda FennelKara Tonika [place], [place] [phone]

## 2014-05-29 NOTE — Addendum Note (Signed)
Addendum  created 05/29/14 [id number] by Earleen NewportAmy A Maud Rubendall, CRNA   Modules edited: Notes Section   Notes Section:  File: [id number]

## 2014-05-29 NOTE — Op Note (Signed)
Alexandra Riley, Alexandra Riley               ACCOUNT NO.:  [id number]  MEDICAL RECORD NO.:  [id number]  LOCATION:  A324                          FACILITY:  [facility]  PHYSICIAN:  J. Darreld Mclean, M.D. DATE OF BIRTH:  Apr 26, 1922  DATE OF PROCEDURE:  05/28/2014 DATE OF DISCHARGE:                              OPERATIVE REPORT   PREOPERATIVE DIAGNOSIS:  Displaced intertrochanteric fracture, right hip.  POSTOPERATIVE DIAGNOSIS:  Displaced intertrochanteric fracture, right hip.  PROCEDURE:  Open fixation and internal reduction of right intertrochanteric fracture of the hip using a Richards classic compression screw hip system with a 145-degree angle four-hole short barrel plate with 16-XW long compression screw.  ANESTHESIA:  Spinal.  SURGEON:  J. Darreld Mclean, MD  ASSISTANT:  Stanislaus Nation, RN  ESTIMATED BLOOD LOSS:  Around 300 mL none replaced.  INDICATIONS:  The patient fell the night and injured her right hip.  She was seen, evaluated and admitted.  The patient has been seen by a hospitalist, Dr. @ is at high risk because of her age but basically good medical condition.  Risks and imponderables were explained to the patient.  She appeared to understand the procedure.  The patient does have significant osteoporosis.  DESCRIPTION OF PROCEDURE:  The patient was seen in the holding area. The right hip was identified as the correct surgical site.  I placed a mark on the right hip.  The patient was brought to the operating room, given spinal anesthesia, and then placed on the fracture table. Traction was applied to her right hip and x-rays of the C-arm were taken.  Everyone had a lead apron, sledge shields, and badges properly protected and covered.  The hip was reduced.  The patient was then prepped and draped in usual manner.  Another generalized time-out identifying the patient as [name] and we are to do ORIF of the right hip.  All instrumentation was properly positioned.  X-ray  was working.  Equipment was functional, and the OR team knew each other.  Incision was made through skin, subcutaneous tissue, tensor fascia lata, vastus lateralis and the bone was exposed.  The hip previously had been reduced by traction.  Hole was made in the femur and a guide pin was placed.  145-degrees angle would be best.  We measured 90 mm, therefore, an 85-mm compression screw was selected.  Step drill was used and drilled up to 85 mm.  Compression screw was then inserted without difficulty, and a 4-hole short barrel 145-degree side plate was attached.  Drill holes were made in the femur under compression.  These measured from 42 mm to 36 mm.  X-ray showed good fixation and reduction. Using a gloved finger, I felt very good as well.  Hemovac drain was placed.  The lead was sewn in place with a 2-0 silk.  The vastus lateralis was reapproximated using a running locking #1 Surgilon suture. The tensor fascia lata fascia was closed using #1 Surgilon suture in interrupted figure-of-eight, and the subcutaneous tissue closed using 2- 0 plain in layers.  Skin reapproximated with skin staples.  It should be noted that the patient had very good musculature exposed for 81 year old, but bones were very  osteoporotic and very easy to place the screws.  The patient went to recovery.  She is in bed.          ______________________________ J. Darreld McleanWayne Livi Mcgann, M.D.     JWK/MEDQ  D:  05/28/2014  T:  05/29/2014  Job:  [id number]

## 2014-05-29 NOTE — Anesthesia Postprocedure Evaluation (Signed)
  Anesthesia Post-op Note  Patient: Alexandra Riley  Procedure(s) Performed: Procedure(s): OPEN REDUCTION INTERNAL FIXATION HIP (Right)  Patient Location: Room 324  Anesthesia Type:Spinal  Level of Consciousness: awake, alert , oriented and patient cooperative  Airway and Oxygen Therapy: Patient Spontanous Breathing  Post-op Pain: moderate  Post-op Assessment: Post-op Vital signs reviewed, Patient's Cardiovascular Status Stable, Respiratory Function Stable, Patent Airway, No signs of Nausea or vomiting, Pain level controlled and No headache  Post-op Vital Signs: Reviewed and stable  Last Vitals:  Filed Vitals:   05/29/14 0824  BP: 113/41  Pulse: 95  Temp: 37 C  Resp: 16    Complications: No apparent anesthesia complications

## 2014-05-29 NOTE — Evaluation (Signed)
Physical Therapy Evaluation Patient Details Name: Alexandra OvermanRuth W Shear MRN: [id number] DOB: 1922/04/27 Today's Date: 05/29/2014   History of Present Illness  Pt is a 81 year old female who sustained a fall at home and fractured her right hip.  She underwent OTIF of the right hip on 05-28-14.  She lives with family and normally ambulates with no assistive device although does use a walker at times.  Family states that she has some dementia.  Clinical Impression  Pt was seen for evaluation/tx.  She was premedicated for pain.  She was somewhat lethargic but able to stay awake and follow directions.  Pt has generalized weakness  In UEs and LLE.  ROM in the RLE is limited due to recent surgery.  She was able to tolerate very gentle ROM per hip protocol.  It required total assist to transfer to edge of bed and max assist to stand with a walker.  She was instructed to minimize weight bearing on the RLE and was able to slowly step to the chair (pivot type transfer).  She had great difficulty moving either LE.  She is now up in the recliner, ice at her right hip.  She will need SNF at d/c.    Follow Up Recommendations SNF    Equipment Recommendations  None recommended by PT    Recommendations for Other Services  none     Precautions / Restrictions Precautions Precautions: Fall Restrictions Weight Bearing Restrictions: Yes RUE Weight Bearing: Touch down weight bearing      Mobility  Bed Mobility Overal bed mobility: Needs Assistance;+2 for physical assistance Bed Mobility: Supine to Sit     Supine to sit: Total assist;+2 for physical assistance;HOB elevated        Transfers Overall transfer level: Needs assistance Equipment used: Rolling walker (2 wheeled) Transfers: Sit to/from [employer] CorporationStand;Stand Pivot Transfers Sit to Stand: Max assist;+2 physical assistance;From elevated surface Stand pivot transfers: Max assist;+2 physical assistance       General transfer comment: used a walker to step  to chair...pt had significant difficulty moving either foot maintaining TTWB right  Ambulation/Gait Ambulation/Gait assistance:  (unable)              Stairs            Wheelchair Mobility    Modified Rankin (Stroke Patients Only)       Balance Overall balance assessment: No apparent balance deficits (not formally assessed)                                           Pertinent Vitals/Pain Pain Assessment: No/denies pain    Home Living Family/patient expects to be discharged to:: Skilled nursing facility                      Prior Function Level of Independence: Needs assistance   Gait / Transfers Assistance Needed: transferred and ambulated with no assistance  ADL's / Homemaking Assistance Needed: able to dress herself, assist with sponge bath        Hand Dominance   Dominant Hand: Right    Extremity/Trunk Assessment               Lower Extremity Assessment: RLE deficits/detail;LLE deficits/detail RLE Deficits / Details: post op limitation of strength and ROM LLE Deficits / Details: generally 3-/5 strength  Cervical / Trunk Assessment: Kyphotic  Communication  Communication: HOH  Cognition Arousal/Alertness: Awake/alert Behavior During Therapy: WFL for tasks assessed/performed Overall Cognitive Status: History of cognitive impairments - at baseline                      General Comments      Exercises General Exercises - Lower Extremity Ankle Circles/Pumps: AAROM;Right;10 reps;Supine Short Arc Quad: AAROM;Right;10 reps;Supine Heel Slides: AAROM;Right;10 reps;Supine Hip ABduction/ADduction: AAROM;Right;10 reps;Supine      Assessment/Plan    PT Assessment Patient needs continued PT services  PT Diagnosis Difficulty walking;Generalized weakness;Acute pain   PT Problem List Decreased strength;Decreased range of motion;Decreased activity tolerance;Decreased mobility;Decreased cognition;Decreased  knowledge of use of DME;Decreased safety awareness;Decreased knowledge of precautions;Pain  PT Treatment Interventions DME instruction;Gait training;Functional mobility training;Therapeutic exercise;Patient/family education   PT Goals (Current goals can be found in the Care Plan section) Acute Rehab PT Goals Patient Stated Goal: none PT Goal Formulation: Patient unable to participate in goal setting Time For Goal Achievement: 06/12/14 Potential to Achieve Goals: Good    Frequency Min 6X/week   Barriers to discharge   none     Co-evaluation               End of Session Equipment Utilized During Treatment: Gait belt Activity Tolerance: Patient tolerated treatment well Patient left: in chair;with chair alarm set Nurse Communication: Mobility status         Time: 9604-5409 PT Time Calculation (min) (ACUTE ONLY): 44 min   Charges:   PT Evaluation $Initial PT Evaluation Tier I: 1 Procedure PT Treatments $Therapeutic Exercise: 8-22 mins   PT G CodesKonrad Penta 05/29/2014, 10:41 AM

## 2014-05-30 LAB — TYPE AND SCREEN
ABO/RH(D): O POS
Antibody Screen: NEGATIVE
UNIT DIVISION: 0
Unit division: 0
Unit division: 0

## 2014-05-30 LAB — CBC WITH DIFFERENTIAL/PLATELET
BASOS PCT: 0 % (ref 0–1)
Basophils Absolute: 0 10*3/uL (ref 0.0–0.1)
EOS ABS: 0.1 10*3/uL (ref 0.0–0.7)
Eosinophils Relative: 1 % (ref 0–5)
HCT: 32.1 % — ABNORMAL LOW (ref 36.0–46.0)
Hemoglobin: 10.5 g/dL — ABNORMAL LOW (ref 12.0–15.0)
LYMPHS ABS: 1.5 10*3/uL (ref 0.7–4.0)
LYMPHS PCT: 12 % (ref 12–46)
MCH: 29.7 pg (ref 26.0–34.0)
MCHC: 32.7 g/dL (ref 30.0–36.0)
MCV: 90.9 fL (ref 78.0–100.0)
MONOS PCT: 14 % — AB (ref 3–12)
Monocytes Absolute: 1.6 10*3/uL — ABNORMAL HIGH (ref 0.1–1.0)
NEUTROS ABS: 8.6 10*3/uL — AB (ref 1.7–7.7)
Neutrophils Relative %: 73 % (ref 43–77)
Platelets: 158 10*3/uL (ref 150–400)
RBC: 3.53 MIL/uL — AB (ref 3.87–5.11)
RDW: 14.9 % (ref 11.5–15.5)
WBC: 11.9 10*3/uL — AB (ref 4.0–10.5)

## 2014-05-30 LAB — COMPREHENSIVE METABOLIC PANEL
ALBUMIN: 2.7 g/dL — AB (ref 3.5–5.2)
ALK PHOS: 46 U/L (ref 39–117)
ALT: 19 U/L (ref 0–35)
AST: 30 U/L (ref 0–37)
Anion gap: 9 (ref 5–15)
BUN: 22 mg/dL (ref 6–23)
CO2: 23 mmol/L (ref 19–32)
CREATININE: 0.78 mg/dL (ref 0.50–1.10)
Calcium: 8 mg/dL — ABNORMAL LOW (ref 8.4–10.5)
Chloride: 108 mmol/L (ref 96–112)
GFR calc Af Amer: 83 mL/min — ABNORMAL LOW (ref >90)
GFR calc non Af Amer: 71 mL/min — ABNORMAL LOW (ref >90)
Glucose, Bld: 106 mg/dL — ABNORMAL HIGH (ref 70–99)
Potassium: 4 mmol/L (ref 3.5–5.1)
Sodium: 140 mmol/L (ref 135–145)
TOTAL PROTEIN: 5.6 g/dL — AB (ref 6.0–8.3)
Total Bilirubin: 1.2 mg/dL (ref 0.3–1.2)

## 2014-05-30 LAB — VITAMIN D 1,25 DIHYDROXY
VITAMIN D3 1, 25 (OH): 39 pg/mL
Vitamin D 1, 25 (OH)2 Total: 39 pg/mL (ref 18–72)
Vitamin D2 1, 25 (OH)2: <8 pg/mL

## 2014-05-30 MED ORDER — MUSCLE RUB 10-15 % EX CREA
TOPICAL_CREAM | Freq: Four times a day (QID) | CUTANEOUS | Status: DC | PRN
  Administered 2014-05-30 – 2014-05-31 (×2): via TOPICAL
  Filled 2014-05-30: qty 85

## 2014-05-30 MED ORDER — TROLAMINE SALICYLATE 10 % EX CREA
TOPICAL_CREAM | Freq: Four times a day (QID) | CUTANEOUS | Status: DC | PRN
  Filled 2014-05-30: qty 85

## 2014-05-30 MED ORDER — POLYETHYLENE GLYCOL 3350 17 G PO PACK
17.0000 g | PACK | Freq: Every day | ORAL | Status: DC | PRN
  Administered 2014-05-30: 17 g via ORAL
  Filled 2014-05-30: qty 1

## 2014-05-30 MED ORDER — MAGNESIUM HYDROXIDE 400 MG/5ML PO SUSP: 30.0000 mL | Freq: Every day | ORAL | Status: DC | PRN

## 2014-05-30 NOTE — Progress Notes (Signed)
Subjective: The patient is alert. She states that she had some nausea and continued pain in right hip. She had open reduction and internal fixation of hip is 2 days postop  Objective: Vital signs in last 24 hours: Temp:  [98 F (36.7 C)-98.9 F (37.2 C)] 98 F (36.7 C) (01/30 0529) Pulse Rate:  [85-95] 85 (01/30 0529) Resp:  [16-20] 20 (01/30 0529) BP: (108-142)/(39-60) 127/47 mmHg (01/30 0529) SpO2:  [94 %-100 %] 96 % (01/30 0529) Weight change:  Last BM Date: 05/26/14  Intake/Output from previous day: 01/29 0701 - 01/30 0700 In: 2630 [P.O.:360; I.V.:1600; Blood:670] Out: 305 [Urine:305] Intake/Output this shift: Total I/O In: -  Out: 400 [Urine:400]  Physical Exam: Gen. appearance-patient is alert and oriented  HEENT negative  Neck supple no JVD or thyroid abnormalities  Heart regular rhythm no murmurs  Lungs clear to P&A  Abdomen no palpable organs or masses  Extremities surgical incision over right hip   Recent Labs  05/29/14 0533 05/30/14 0625  WBC 16.1* 11.9*  HGB 9.2* 10.5*  HCT 28.3* 32.1*  PLT 163 158   BMET  Recent Labs  05/29/14 0533 05/30/14 0625  NA 135 140  K 4.0 4.0  CL 107 108  CO2 24 23  GLUCOSE 133* 106*  BUN 13 22  CREATININE 0.66 0.78  CALCIUM 7.9* 8.0*    Studies/Results: Dg Hip Operative Unilat With Pelvis Right  05/28/2014   CLINICAL DATA:  RIGHT hip fracture, closed, initial encounter, ORIF  EXAM: OPERATIVE RIGHT HIP WITH PELVIS  COMPARISON:  Three digital C-arm fluoroscopic images obtained intraoperatively are compared to preoperative radiographs of 05/26/2014  FLUOROSCOPY TIME:  0 min 41 seconds  FINDINGS: Lateral plate compression screw placed across a reduced intertrochanteric fracture of the proximal RIGHT femur.  No dislocation.  Bones appear demineralized.  Lesser trochanteric fracture fragment remains slightly displaced.  Visualized RIGHT pelvis appears intact.  IMPRESSION: Post 0RIF of an intertrochanteric fracture  RIGHT femur.   Electronically Signed   By: Ulyses SouthwardMark  Boles M.D.   On: 05/28/2014 12:45    Medications:  . sodium chloride   Intravenous Once  . atorvastatin  10 mg Oral q1800  . carisoprodol  350 mg Oral QHS  . clonazePAM  0.5 mg Oral QHS  . enoxaparin (LOVENOX) injection  40 mg Subcutaneous Q24H  . HYDROcodone-acetaminophen  1 tablet Oral Q4H    . 0.9 % NaCl with KCl 20 mEq / L 75 mL/hr at 05/29/14 1400     Assessment/Plan: 1. Intratrochanteric fracture right hip-2 days postop following open reduction internal fixation of right hip by orthopedic surgery service. Plan to continue with physical therapy. Patient will be sent to nursing facility for convalescence. She is to continue current regimen.   LOS: 4 days   Jaison Petraglia G 05/30/2014, 7:57 AM

## 2014-05-30 NOTE — Progress Notes (Signed)
Subjective: 2 Days Post-Op Procedure(s) (LRB): OPEN REDUCTION INTERNAL FIXATION HIP (Right) Patient reports pain as 3 on 0-10 scale.    Objective: Vital signs in last 24 hours: Temp:  [98 F (36.7 C)-98.9 F (37.2 C)] 98 F (36.7 C) (01/30 0529) Pulse Rate:  [85-93] 85 (01/30 0529) Resp:  [16-20] 20 (01/30 0529) BP: (108-142)/(39-60) 127/47 mmHg (01/30 0529) SpO2:  [94 %-100 %] 96 % (01/30 0529)  Intake/Output from previous day: 01/29 0701 - 01/30 0700 In: 2630 [P.O.:360; I.V.:1600; Blood:670] Out: 305 [Urine:305] Intake/Output this shift: Total I/O In: -  Out: 402 [Urine:400; Drains:2]   Recent Labs  05/28/14 0553 05/29/14 0533 05/30/14 0625  HGB 11.0* 9.2* 10.5*    Recent Labs  05/29/14 0533 05/30/14 0625  WBC 16.1* 11.9*  RBC 3.05* 3.53*  HCT 28.3* 32.1*  PLT 163 158    Recent Labs  05/29/14 0533 05/30/14 0625  NA 135 140  K 4.0 4.0  CL 107 108  CO2 24 23  BUN 13 22  CREATININE 0.66 0.78  GLUCOSE 133* 106*  CALCIUM 7.9* 8.0*   No results for input(s): LABPT, INR in the last 72 hours.  Neurologically intact Neurovascular intact Sensation intact distally Intact pulses distally Dorsiflexion/Plantar flexion intact Incision: no drainage   HGB to 10.5 after transfusions yesterday.  She is up in chair with minimal pain.  Wound looks good.  Hemovac removed.  Foley to be removed.  She has a small blister on the lateral calf proximally.  The patient said she did not move her leg at all and it rubbed on the sheets.  This will be protected.   She did fair in PT.  Assessment/Plan: 2 Days Post-Op Procedure(s) (LRB): OPEN REDUCTION INTERNAL FIXATION HIP (Right) Up with therapy  Hibba Schram 05/30/2014, 10:44 AM

## 2014-05-30 NOTE — Progress Notes (Signed)
Physical Therapy Treatment Patient Details Name: Alexandra Riley MRN: [id number] DOB: [birth year] Today's Date: 05/30/2014    History of Present Illness Pt is a 81 year old female who sustained a fall at home and fractured her right hip.  She underwent OTIF of the right hip on 05-28-14.  She lives with family and normally ambulates with no assistive device although does use a walker at times.  Family states that she has some dementia.    PT Comments    Patient was seen in bed, half finished breakfast and more responsive to family and PT. Patient express no appetite for food. No current new complaint or increase pain on affected right LE. Tolerated all therapeutic exercises with no complaints of increase pain or signs of intolerance. She is now in the chair, comfortable.    Follow Up Recommendations  SNF     Equipment Recommendations  None recommended by PT    Recommendations for Other Services  none     Precautions / Restrictions Precautions Precautions: Fall Restrictions Weight Bearing Restrictions: Yes RUE Weight Bearing: Touch down weight bearing    Mobility  Bed Mobility Overal bed mobility: +2 for physical assistance Bed Mobility: Supine to Sit     Supine to sit: Total assist;+2 for physical assistance;HOB elevated        Transfers Overall transfer level: Needs assistance Equipment used: Rolling walker (2 wheeled) Transfers: Sit to/from [employer]J'sStand;Stand Pivot Transfers Sit to Stand: +2 physical assistance;Max assist Stand pivot transfers: +2 physical assistance;Mod assist       General transfer comment: used a walker to step to chair...pt had significant difficulty moving either foot maintaining TTWB right  Ambulation/Gait                 Stairs            Wheelchair Mobility    Modified Rankin (Stroke Patients Only)       Balance Overall balance assessment: No apparent balance deficits (not formally assessed)                                  Cognition Arousal/Alertness: Awake/alert Behavior During Therapy: WFL for tasks assessed/performed Overall Cognitive Status: Within Functional Limits for tasks assessed                      Exercises General Exercises - Lower Extremity Ankle Circles/Pumps: AROM;10 reps;Supine;Right Quad Sets: AAROM;10 reps;Right;Supine Gluteal Sets: AAROM;Right;10 reps;Supine Short Arc Quad: AAROM;Right;10 reps;Supine Heel Slides: AAROM;Right;10 reps;Supine Hip ABduction/ADduction: AAROM;Right;10 reps;Supine    General Comments        Pertinent Vitals/Pain Pain Assessment: No/denies pain    Home Living                      Prior Function            PT Goals (current goals can now be found in the care plan section) Progress towards PT goals: Progressing toward goals    Frequency  Min 6X/week    PT Plan Current plan remains appropriate    Co-evaluation             End of Session Equipment Utilized During Treatment: Gait belt Activity Tolerance: Patient tolerated treatment well;Patient limited by pain Patient left: in chair;with call bell/phone within reach;with chair alarm set;with family/visitor present     Time: 0902-0950 PT Time Calculation (min) (ACUTE ONLY): 48 min  Charges:  $Therapeutic Exercise: 8-22 mins $Therapeutic Activity: 23-37 mins                    G Codes:      Konrad Penta 06-29-14, 10:14 AM

## 2014-05-31 LAB — COMPREHENSIVE METABOLIC PANEL
ALBUMIN: 2.5 g/dL — AB (ref 3.5–5.2)
ALT: 79 U/L — AB (ref 0–35)
AST: 113 U/L — AB (ref 0–37)
Alkaline Phosphatase: 83 U/L (ref 39–117)
Anion gap: 7 (ref 5–15)
BILIRUBIN TOTAL: 1 mg/dL (ref 0.3–1.2)
BUN: 17 mg/dL (ref 6–23)
CO2: 23 mmol/L (ref 19–32)
Calcium: 7.9 mg/dL — ABNORMAL LOW (ref 8.4–10.5)
Chloride: 109 mmol/L (ref 96–112)
Creatinine, Ser: 0.66 mg/dL (ref 0.50–1.10)
GFR, EST AFRICAN AMERICAN: 87 mL/min — AB (ref >90)
GFR, EST NON AFRICAN AMERICAN: 75 mL/min — AB (ref >90)
Glucose, Bld: 101 mg/dL — ABNORMAL HIGH (ref 70–99)
Potassium: 3.6 mmol/L (ref 3.5–5.1)
SODIUM: 139 mmol/L (ref 135–145)
Total Protein: 5.8 g/dL — ABNORMAL LOW (ref 6.0–8.3)

## 2014-05-31 LAB — CBC WITH DIFFERENTIAL/PLATELET
Basophils Absolute: 0 10*3/uL (ref 0.0–0.1)
Basophils Relative: 0 % (ref 0–1)
Eosinophils Absolute: 0.1 10*3/uL (ref 0.0–0.7)
Eosinophils Relative: 1 % (ref 0–5)
HCT: 33.9 % — ABNORMAL LOW (ref 36.0–46.0)
HEMOGLOBIN: 11 g/dL — AB (ref 12.0–15.0)
Lymphocytes Relative: 14 % (ref 12–46)
Lymphs Abs: 1.4 10*3/uL (ref 0.7–4.0)
MCH: 29.9 pg (ref 26.0–34.0)
MCHC: 32.4 g/dL (ref 30.0–36.0)
MCV: 92.1 fL (ref 78.0–100.0)
Monocytes Absolute: 1.1 10*3/uL — ABNORMAL HIGH (ref 0.1–1.0)
Monocytes Relative: 12 % (ref 3–12)
NEUTROS ABS: 7.1 10*3/uL (ref 1.7–7.7)
Neutrophils Relative %: 73 % (ref 43–77)
Platelets: 202 10*3/uL (ref 150–400)
RBC: 3.68 MIL/uL — ABNORMAL LOW (ref 3.87–5.11)
RDW: 15 % (ref 11.5–15.5)
WBC: 9.7 10*3/uL (ref 4.0–10.5)

## 2014-05-31 LAB — TYPE AND SCREEN
ABO/RH(D): O POS
Antibody Screen: NEGATIVE
UNIT DIVISION: 0

## 2014-05-31 NOTE — Progress Notes (Signed)
Subjective: The patient is alert and oriented. She continues to have discomfort in right hip. She had open reduction and internal fixation of hip is 3 days postop  Objective: Vital signs in last 24 hours: Temp:  [98.1 F (36.7 C)-99.2 F (37.3 C)] 99.2 F (37.3 C) (01/31 0617) Pulse Rate:  [88-110] 88 (01/31 0617) Resp:  [18-20] 20 (01/31 0617) BP: (124-139)/(48-63) 139/63 mmHg (01/31 0617) SpO2:  [98 %-100 %] 100 % (01/31 0617) Weight change:  Last BM Date: 05/30/14  Intake/Output from previous day: 01/30 0701 - 01/31 0700 In: 1613.8 [P.O.:680; I.V.:933.8] Out: 1002 [Urine:1000; Drains:2] Intake/Output this shift:    Physical Exam: Gen. appearance-the patient is alert and oriented  HEENT negative  Neck supple no JVD or thyroid abnormalities  Heart regular rhythm no murmurs  Lungs clear to P&A  Abdomen no palpable organs or masses  Extremities surgical incision of right hip   Recent Labs  05/30/14 0625 05/31/14 0637  WBC 11.9* 9.7  HGB 10.5* 11.0*  HCT 32.1* 33.9*  PLT 158 202   BMET  Recent Labs  05/30/14 0625 05/31/14 0637  NA 140 139  K 4.0 3.6  CL 108 109  CO2 23 23  GLUCOSE 106* 101*  BUN 22 17  CREATININE 0.78 0.66  CALCIUM 8.0* 7.9*    Studies/Results: No results found.  Medications:  . sodium chloride   Intravenous Once  . atorvastatin  10 mg Oral q1800  . carisoprodol  350 mg Oral QHS  . clonazePAM  0.5 mg Oral QHS  . enoxaparin (LOVENOX) injection  40 mg Subcutaneous Q24H  . HYDROcodone-acetaminophen  1 tablet Oral Q4H    . 0.9 % NaCl with KCl 20 mEq / L 75 mL/hr at 05/30/14 1651     Assessment/Plan: 1. Intertrochanteric fracture right hip-2 days postop following open reduction internal fixation of right hip by orthopedic surgery. She will be sent to nursing facility for convalescence. She is to continue current regimen continue with physical therapy.   LOS: 5 days   Pepe Mineau G 05/31/2014, 7:45 AM

## 2014-05-31 NOTE — Progress Notes (Signed)
Subjective: 3 Days Post-Op Procedure(s) (LRB): OPEN REDUCTION INTERNAL FIXATION HIP (Right) Patient reports pain as mild.    Objective: Vital signs in last 24 hours: Temp:  [98.1 F (36.7 C)-99.2 F (37.3 C)] 99.2 F (37.3 C) (01/31 0617) Pulse Rate:  [88-110] 88 (01/31 0617) Resp:  [18-20] 20 (01/31 0617) BP: (124-139)/(48-63) 139/63 mmHg (01/31 0617) SpO2:  [98 %-100 %] 100 % (01/31 0617)  Intake/Output from previous day: 01/30 0701 - 01/31 0700 In: 1613.8 [P.O.:680; I.V.:933.8] Out: 1002 [Urine:1000; Drains:2] Intake/Output this shift:     Recent Labs  05/29/14 0533 05/30/14 0625 05/31/14 0637  HGB 9.2* 10.5* 11.0*    Recent Labs  05/30/14 0625 05/31/14 0637  WBC 11.9* 9.7  RBC 3.53* 3.68*  HCT 32.1* 33.9*  PLT 158 202    Recent Labs  05/30/14 0625 05/31/14 0637  NA 140 139  K 4.0 3.6  CL 108 109  CO2 23 23  BUN 22 17  CREATININE 0.78 0.66  GLUCOSE 106* 101*  CALCIUM 8.0* 7.9*   No results for input(s): LABPT, INR in the last 72 hours.  Neurologically intact Neurovascular intact Sensation intact distally Intact pulses distally Dorsiflexion/Plantar flexion intact Incision: no drainage   She had a good night.  She has no major complaints.  Assessment/Plan: 3 Days Post-Op Procedure(s) (LRB): OPEN REDUCTION INTERNAL FIXATION HIP (Right) Plan for discharge tomorrow   She will need to go to SNF.  She will need walker, toe touch on the right with daily physical therapy.  She will need to continue the enoxaparin daily for one month.  I will need to see her in my office in one month after discharge with x-rays of the right hip prior to the appointment to the office.    Mayan Kloepfer 05/31/2014, 10:24 AM

## 2014-06-01 LAB — CBC WITH DIFFERENTIAL/PLATELET
BASOS ABS: 0 10*3/uL (ref 0.0–0.1)
BASOS PCT: 0 % (ref 0–1)
Eosinophils Absolute: 0.2 10*3/uL (ref 0.0–0.7)
Eosinophils Relative: 2 % (ref 0–5)
HCT: 32.4 % — ABNORMAL LOW (ref 36.0–46.0)
Hemoglobin: 10.3 g/dL — ABNORMAL LOW (ref 12.0–15.0)
Lymphocytes Relative: 6 % — ABNORMAL LOW (ref 12–46)
Lymphs Abs: 0.6 10*3/uL — ABNORMAL LOW (ref 0.7–4.0)
MCH: 29.3 pg (ref 26.0–34.0)
MCHC: 31.8 g/dL (ref 30.0–36.0)
MCV: 92.3 fL (ref 78.0–100.0)
Monocytes Absolute: 1.4 10*3/uL — ABNORMAL HIGH (ref 0.1–1.0)
Monocytes Relative: 15 % — ABNORMAL HIGH (ref 3–12)
NEUTROS ABS: 7.2 10*3/uL (ref 1.7–7.7)
NEUTROS PCT: 77 % (ref 43–77)
PLATELETS: 218 10*3/uL (ref 150–400)
RBC: 3.51 MIL/uL — ABNORMAL LOW (ref 3.87–5.11)
RDW: 14.9 % (ref 11.5–15.5)
WBC: 9.3 10*3/uL (ref 4.0–10.5)

## 2014-06-01 MED ORDER — ENOXAPARIN SODIUM 40 MG/0.4ML ~~LOC~~ SOLN: 40.0000 mg | SUBCUTANEOUS | Status: DC

## 2014-06-01 NOTE — Discharge Summary (Signed)
Physician Discharge Summary  Alexandra Riley:[id number] DOB: [birth year] DOA: 05/26/2014  PCP: [name], MD  Admit date: 05/26/2014 Discharge date: 06/01/2014     Discharge Diagnoses:  1. Intertrochanteric fracture right hip 2. Hyperlipidemia 3. Hypokalemia  Discharge Condition: Stable Disposition: Transfer to nursing facility   Diet recommendation: Regular diet  Filed Weights   05/26/14 2317 05/28/14 1415  Weight: 66.3 kg (146 lb 2.6 oz) 71.7 kg (158 lb 1.1 oz)    History of present illness:  The patient had a fall at home landing on right hip and with head trauma. She was brought to the ED of [facility] where she was evaluated. She was found by x-ray to have intertrochanteric fracture right hip. He was therefore admitted. X-rays did not show evidence of head trauma or cervical spine fracture.  Hospital Course:  The patient was admitted to MedSurg floor. She was started on intravenous fluids. She was given morphine 1-2 mg intravenously every 2 hours when necessary for pain. She was also given Zofran 4 mg every 6 hours when necessary for nausea. She was seen by orthopedic service Dr. Hilda Lias. She was taken to surgery the following day where she had open reduction internal fixation of right hip under general anesthesia. She tolerated surgery and successful manner. She is continued on her regular medications as listed below. Physical therapy worked with the patient and she tolerated this in satisfactory manner. It was felt up to 79 days that the patient could be transferred to skilled nursing facility where she will continue with physical therapy. She did receive a unit of blood.   Discharge Instructions The patient is to continue medications listed below. She is being discharged to skilled nursing facility.    Medication List    ASK your doctor about these medications        aspirin 81 MG chewable tablet  Chew 81 mg by mouth daily.     atorvastatin 10  MG tablet  Commonly known as:  LIPITOR  Take 10 mg by mouth every morning.     carisoprodol 350 MG tablet  Commonly known as:  SOMA  Take 350 mg by mouth at bedtime. *May take one to two additional tablets only if needed For sleep and muscle relaxant     clonazePAM 0.5 MG tablet  Commonly known as:  KLONOPIN  Take 0.5 mg by mouth at bedtime. *May take one to two tablets daily as needed For anxiety     HYDROcodone-acetaminophen 7.5-325 MG per tablet  Commonly known as:  NORCO  Take 1 tablet by mouth every 4 (four) hours.     loratadine 10 MG tablet  Commonly known as:  CLARITIN  Take 10 mg by mouth daily as needed for allergies.     ondansetron 4 MG tablet  Commonly known as:  ZOFRAN  Take 4 mg by mouth every 6 (six) hours as needed for nausea or vomiting.       No Known Allergies  The results of significant diagnostics from this hospitalization (including imaging, microbiology, ancillary and laboratory) are listed below for reference.    Significant Diagnostic Studies: Dg Chest 1 View  05/26/2014   CLINICAL DATA:  Patient fell earlier in the day  EXAM: CHEST - 1 VIEW  COMPARISON:  Sep 19, 2013  FINDINGS: There is a calcified granuloma in the left base. No edema or consolidation. Heart is upper normal in size with pulmonary vascularity within normal limits. There is atherosclerotic change in the  aorta. No pneumothorax. No acute fractures are evident on this study  IMPRESSION: No edema or consolidation. Granuloma left base. No apparent pneumothorax.   Electronically Signed   By: Bretta BangWilliam  Woodruff M.D.   On: 05/26/2014 20:36   Ct Head Wo Contrast  05/26/2014   CLINICAL DATA:  Fall today, right hip pain  EXAM: CT HEAD WITHOUT CONTRAST  CT CERVICAL SPINE WITHOUT CONTRAST  TECHNIQUE: Multidetector CT imaging of the head and cervical spine was performed following the standard protocol without intravenous contrast. Multiplanar CT image reconstructions of the cervical spine were also  generated.  COMPARISON:  09/19/2013  FINDINGS: CT HEAD FINDINGS  No skull fracture is noted. Paranasal sinuses and mastoid air cells are unremarkable. Stable cerebral atrophy. No intracranial hemorrhage, mass effect or midline shift. No acute cortical infarction. Stable periventricular and patchy subcortical chronic white matter disease. No mass lesion is noted on this unenhanced scan.  CT CERVICAL SPINE FINDINGS  Axial images of the cervical spine shows no acute fracture or subluxation. Computer processed images shows no acute fracture or subluxation. There is diffuse osteopenia. Mild degenerative changes C1-C2 articulation. Mild anterior spurring lower endplate of C4 and C5 vertebral bodies. No prevertebral soft tissue swelling. Cervical airway is patent. There is no pneumothorax in visualized lung apices. Bilateral apical scarring. Atherosclerotic calcifications are noted bilateral carotid bifurcation.  IMPRESSION: 1. No acute intracranial abnormality. Stable atrophy and chronic white matter disease. 2. No cervical spine acute fracture or subluxation. Minimal degenerative changes.   Electronically Signed   By: Natasha MeadLiviu  Pop M.D.   On: 05/26/2014 20:58   Ct Cervical Spine Wo Contrast  05/26/2014   CLINICAL DATA:  Fall today, right hip pain  EXAM: CT HEAD WITHOUT CONTRAST  CT CERVICAL SPINE WITHOUT CONTRAST  TECHNIQUE: Multidetector CT imaging of the head and cervical spine was performed following the standard protocol without intravenous contrast. Multiplanar CT image reconstructions of the cervical spine were also generated.  COMPARISON:  09/19/2013  FINDINGS: CT HEAD FINDINGS  No skull fracture is noted. Paranasal sinuses and mastoid air cells are unremarkable. Stable cerebral atrophy. No intracranial hemorrhage, mass effect or midline shift. No acute cortical infarction. Stable periventricular and patchy subcortical chronic white matter disease. No mass lesion is noted on this unenhanced scan.  CT CERVICAL  SPINE FINDINGS  Axial images of the cervical spine shows no acute fracture or subluxation. Computer processed images shows no acute fracture or subluxation. There is diffuse osteopenia. Mild degenerative changes C1-C2 articulation. Mild anterior spurring lower endplate of C4 and C5 vertebral bodies. No prevertebral soft tissue swelling. Cervical airway is patent. There is no pneumothorax in visualized lung apices. Bilateral apical scarring. Atherosclerotic calcifications are noted bilateral carotid bifurcation.  IMPRESSION: 1. No acute intracranial abnormality. Stable atrophy and chronic white matter disease. 2. No cervical spine acute fracture or subluxation. Minimal degenerative changes.   Electronically Signed   By: Natasha MeadLiviu  Pop M.D.   On: 05/26/2014 20:58   Dg Hip Operative Unilat With Pelvis Right  05/28/2014   CLINICAL DATA:  RIGHT hip fracture, closed, initial encounter, ORIF  EXAM: OPERATIVE RIGHT HIP WITH PELVIS  COMPARISON:  Three digital C-arm fluoroscopic images obtained intraoperatively are compared to preoperative radiographs of 05/26/2014  FLUOROSCOPY TIME:  0 min 41 seconds  FINDINGS: Lateral plate compression screw placed across a reduced intertrochanteric fracture of the proximal RIGHT femur.  No dislocation.  Bones appear demineralized.  Lesser trochanteric fracture fragment remains slightly displaced.  Visualized RIGHT pelvis appears intact.  IMPRESSION: Post 0RIF of an intertrochanteric fracture RIGHT femur.   Electronically Signed   By: Ulyses Southward M.D.   On: 05/28/2014 12:45   Dg Hip Unilat With Pelvis 2-3 Views Right  05/26/2014   CLINICAL DATA:  Pain following fall  EXAM: DG HIP W/ PELVIS 2-3V*R*  COMPARISON:  None.  FINDINGS: Frontal pelvis as well as frontal and lateral right hip images were obtained. There is a comminuted intertrochanteric femur fracture on the right with marked varus angulation at the fracture site. There are several displaced fracture fragments in this area. There  is avulsion of the lesser trochanter on the right.  No other fractures. No dislocation. There is mild symmetric narrowing of both hip joints. Bones are osteoporotic.  IMPRESSION: Comminuted intertrochanteric femur fracture on the right with varus angulation of the fracture site. No dislocation. Bones osteoporotic.   Electronically Signed   By: Bretta Bang M.D.   On: 05/26/2014 20:37    Microbiology: Recent Results (from the past 240 hour(s))  Surgical pcr screen     Status: None   Collection Time: 05/27/14 10:31 PM  Result Value Ref Range Status   MRSA, PCR NEGATIVE NEGATIVE Final   Staphylococcus aureus NEGATIVE NEGATIVE Final    Comment:        The Xpert SA Assay (FDA approved for NASAL specimens in patients over 50 years of age), is one component of a comprehensive surveillance program.  Test performance has been validated by [facility] for patients greater than or equal to 60 year old. It is not intended to diagnose infection nor to guide or monitor treatment.      Labs: Basic Metabolic Panel:  Recent Labs Lab 05/26/14 1940  05/28/14 0020 05/28/14 1610 05/29/14 0533 05/30/14 9604 05/31/14 0637  NA 141  < > 137 137 135 140 139  K 3.3*  < > 3.8 4.2 4.0 4.0 3.6  CL 109  < > 109 108 107 108 109  CO2 27  < > GLUCOSE 145*  < > 141* 128* 133* 106* 101*  BUN 16  < > CREATININE 0.96  < > 0.63 0.59 0.66 0.78 0.66  CALCIUM 9.6  < > 8.6 8.8 7.9* 8.0* 7.9*  MG 2.2  --   --   --   --   --   --   < > = values in this interval not displayed. Liver Function Tests:  Recent Labs Lab 05/27/14 0615 05/28/14 0553 05/29/14 0533 05/30/14 0625 05/31/14 0637  AST 113*  ALT 79*  ALKPHOS 56 48 40 46 83  BILITOT 0.5 0.6 0.7 1.2 1.0  PROT 6.2 6.0 5.6* 5.6* 5.8*  ALBUMIN 3.7 3.3* 2.8* 2.7* 2.5*   No results for input(s): LIPASE, AMYLASE in the last 168 hours. No results for input(s): AMMONIA in the last 168  hours. CBC:  Recent Labs Lab 05/28/14 0553 05/29/14 0533 05/30/14 0625 05/31/14 0637 06/01/14 0613  WBC 12.1* 16.1* 11.9* 9.7 9.3  NEUTROABS 8.9* 13.1* 8.6* 7.1 7.2  HGB 11.0* 9.2* 10.5* 11.0* 10.3*  HCT 34.5* 28.3* 32.1* 33.9* 32.4*  MCV 93.2 92.8 90.9 92.1 92.3  PLT 173 163 158 202 218   Cardiac Enzymes: No results for input(s): CKTOTAL, CKMB, CKMBINDEX, TROPONINI in the last 168 hours. BNP: BNP (last 3 results) No results for input(s): PROBNP in the last 8760 hours. CBG: No results for input(s):  GLUCAP in the last 168 hours.  Active Problems:   Hip fracture   Time coordinating discharge: 30 minutes  Signed:  Butch Penny, MD 06/01/2014, 12:45 PM

## 2014-06-01 NOTE — Progress Notes (Signed)
Subjective: The patient is alert and oriented. She does have some discomfort in her right hip. She had open reduction and internal fixation of hip and is 4 days of postop  Objective: Vital signs in last 24 hours: Temp:  [99.1 F (37.3 C)-99.2 F (37.3 C)] 99.1 F (37.3 C) (01/31 2222) Pulse Rate:  [87-88] 87 (01/31 2222) Resp:  [20] 20 (01/31 2222) BP: (116-139)/(53-63) 116/53 mmHg (01/31 2222) SpO2:  [94 %-100 %] 94 % (01/31 2222) Weight change:  Last BM Date: 05/31/14  Intake/Output from previous day: 01/31 0701 - 02/01 0700 In: 825 [I.V.:825] Out: 700 [Urine:700] Intake/Output this shift: Total I/O In: 825 [I.V.:825] Out: 700 [Urine:700]  Physical Exam: Gen. appearance the patient is alert and oriented  HEENT negative  Neck supple no JVD or thyroid abnormalities  Heart regular rhythm no murmurs  Lungs clear to P&A  Abdomen no palpable organs or masses  Extremities surgical incision of right hip   Recent Labs  05/30/14 0625 05/31/14 0637  WBC 11.9* 9.7  HGB 10.5* 11.0*  HCT 32.1* 33.9*  PLT 158 202   BMET  Recent Labs  05/30/14 0625 05/31/14 0637  NA 140 139  K 4.0 3.6  CL 108 109  CO2 23 23  GLUCOSE 106* 101*  BUN 22 17  CREATININE 0.78 0.66  CALCIUM 8.0* 7.9*    Studies/Results: No results found.  Medications:  . sodium chloride   Intravenous Once  . atorvastatin  10 mg Oral q1800  . carisoprodol  350 mg Oral QHS  . clonazePAM  0.5 mg Oral QHS  . enoxaparin (LOVENOX) injection  40 mg Subcutaneous Q24H  . HYDROcodone-acetaminophen  1 tablet Oral Q4H    . 0.9 % NaCl with KCl 20 mEq / L 75 mL/hr at 06/01/14 0508     Assessment/Plan: 1. Intratrochanteric fracture right hip-4 days postop following open reduction internal fixation right hip by orthopedic surgery. The patient will be sent to nursing facility for convalescence. She will continue current regimen with continued physical therapy   LOS: 6 days   Strider Vallance G 06/01/2014,  6:00 AM

## 2014-06-01 NOTE — Progress Notes (Signed)
Subjective: 4 Days Post-Op Procedure(s) (LRB): OPEN REDUCTION INTERNAL FIXATION HIP (Right) Patient reports pain as mild.    Objective: Vital signs in last 24 hours: Temp:  [98.5 F (36.9 C)-99.1 F (37.3 C)] 98.5 F (36.9 C) (02/01 0709) Pulse Rate:  [82-87] 82 (02/01 0709) Resp:  [20] 20 (02/01 0709) BP: (116-139)/(49-53) 139/49 mmHg (02/01 0709) SpO2:  [94 %-99 %] 99 % (02/01 0709)  Intake/Output from previous day: 01/31 0701 - 02/01 0700 In: 825 [I.V.:825] Out: 700 [Urine:700] Intake/Output this shift:     Recent Labs  05/30/14 0625 05/31/14 0637 06/01/14 0613  HGB 10.5* 11.0* 10.3*    Recent Labs  05/31/14 0637 06/01/14 0613  WBC 9.7 9.3  RBC 3.68* 3.51*  HCT 33.9* 32.4*  PLT 202 218    Recent Labs  05/30/14 0625 05/31/14 0637  NA 140 139  K 4.0 3.6  CL 108 109  CO2 23 23  BUN 22 17  CREATININE 0.78 0.66  GLUCOSE 106* 101*  CALCIUM 8.0* 7.9*   No results for input(s): LABPT, INR in the last 72 hours.  Neurologically intact Neurovascular intact Sensation intact distally Intact pulses distally Dorsiflexion/Plantar flexion intact Incision: no drainage   She is doing well.  She has some constipation but medicines have been ordered.  Hopefully she can go to SNF today if bed available.  Labs OK.  Assessment/Plan: 4 Days Post-Op Procedure(s) (LRB): OPEN REDUCTION INTERNAL FIXATION HIP (Right) Discharge to SNF when bed available.  Alexandra Riley 06/01/2014, 7:24 AM

## 2014-06-01 NOTE — Clinical Social Work Note (Signed)
Pt d/c today to [facility], Pt, pt's daughter Maretta LosVilma, and facility aware and agreeable. D/C summary faxed. [facility] to transport [facility]. [facility] will pick up prescription from PCP office.   Derenda FennelKara Yasemin Rabon, [place] [phone]

## 2014-06-01 NOTE — Clinical Social Work Placement (Signed)
Clinical Social Work Department CLINICAL SOCIAL WORK PLACEMENT NOTE 06/01/2014  Patient:  Alexandra Riley,[name]  Account Number:  [id number] Admit date:  05/26/2014  Clinical Social Worker:  Derenda FennelKARA Anik Wesch, LCSW  Date/time:  05/27/2014 09:28 AM  Clinical Social Work is seeking post-discharge placement for this patient at the following level of care:   SKILLED NURSING   (*CSW will update this form in Epic as items are completed)   05/27/2014  Patient/family provided with [facility] Department of Clinical Social Work's list of facilities offering this level of care within the geographic area requested by the patient (or if unable, by the patient's family).  05/27/2014  Patient/family informed of their freedom to choose among providers that offer the needed level of care, that participate in Medicare, Medicaid or managed care program needed by the patient, have an available bed and are willing to accept the patient.  05/27/2014  Patient/family informed of [facility]' ownership interest in [facility], as well as of the fact that they are under no obligation to receive care at this facility.  PASARR submitted to EDS on 05/27/2014 PASARR number received on 05/27/2014  FL2 transmitted to all facilities in geographic area requested by pt/family on  05/27/2014 FL2 transmitted to all facilities within larger geographic area on   Patient informed that his/her managed care company has contracts with or will negotiate with  certain facilities, including the following:     Patient/family informed of bed offers received:  05/29/2014 [facility] [place] [facility] and [facility] [place]  [facility]  06/01/2014 [facility] Patient and family notified of transfer on 06/01/2014 Name of family member notified:  Maretta LosVilma- daughter  The following physician  request were entered in Epic:   Additional Comments:  Derenda FennelKara Hisao Doo, LCSW [phone]

## 2014-06-02 NOTE — Progress Notes (Signed)
UR chart review completed.  

## 2014-06-18 ENCOUNTER — Other Ambulatory Visit (HOSPITAL_COMMUNITY): Payer: Self-pay | Admitting: Orthopaedic Surgery

## 2014-06-18 ENCOUNTER — Ambulatory Visit (HOSPITAL_COMMUNITY)
Admission: RE | Admit: 2014-06-18 | Discharge: 2014-06-18 | Disposition: A | Discharge status: Home / Self Care | Payer: Medicare Other | Source: Ambulatory Visit | Attending: Orthopaedic Surgery | Admitting: Orthopaedic Surgery

## 2014-06-18 DIAGNOSIS — T148XXA Other injury of unspecified body region, initial encounter: Secondary | ICD-10-CM

## 2014-06-18 DIAGNOSIS — S7221XD Displaced subtrochanteric fracture of right femur, subsequent encounter for closed fracture with routine healing: Secondary | ICD-10-CM | POA: Insufficient documentation

## 2014-06-18 DIAGNOSIS — X58XXXD Exposure to other specified factors, subsequent encounter: Secondary | ICD-10-CM | POA: Diagnosis not present

## 2014-07-10 ENCOUNTER — Emergency Department (HOSPITAL_COMMUNITY): Payer: Medicare Other

## 2014-07-10 ENCOUNTER — Emergency Department (HOSPITAL_COMMUNITY)
Admission: EM | Admit: 2014-07-10 | Discharge: 2014-07-10 | Disposition: A | Discharge status: Home / Self Care | Payer: Medicare Other | Attending: Emergency Medicine | Admitting: Emergency Medicine

## 2014-07-10 ENCOUNTER — Encounter (HOSPITAL_COMMUNITY): Payer: Self-pay

## 2014-07-10 DIAGNOSIS — Z79899 Other long term (current) drug therapy: Secondary | ICD-10-CM | POA: Diagnosis not present

## 2014-07-10 DIAGNOSIS — K6289 Other specified diseases of anus and rectum: Secondary | ICD-10-CM | POA: Insufficient documentation

## 2014-07-10 DIAGNOSIS — Z9889 Other specified postprocedural states: Secondary | ICD-10-CM | POA: Diagnosis not present

## 2014-07-10 DIAGNOSIS — R109 Unspecified abdominal pain: Secondary | ICD-10-CM | POA: Diagnosis present

## 2014-07-10 DIAGNOSIS — R1032 Left lower quadrant pain: Secondary | ICD-10-CM | POA: Insufficient documentation

## 2014-07-10 DIAGNOSIS — Z7982 Long term (current) use of aspirin: Secondary | ICD-10-CM | POA: Insufficient documentation

## 2014-07-10 DIAGNOSIS — R197 Diarrhea, unspecified: Secondary | ICD-10-CM | POA: Insufficient documentation

## 2014-07-10 DIAGNOSIS — R112 Nausea with vomiting, unspecified: Secondary | ICD-10-CM | POA: Insufficient documentation

## 2014-07-10 DIAGNOSIS — Z792 Long term (current) use of antibiotics: Secondary | ICD-10-CM | POA: Insufficient documentation

## 2014-07-10 LAB — COMPREHENSIVE METABOLIC PANEL
ALT: 18 U/L (ref 0–35)
ANION GAP: 6 (ref 5–15)
AST: 27 U/L (ref 0–37)
Albumin: 3.3 g/dL — ABNORMAL LOW (ref 3.5–5.2)
Alkaline Phosphatase: 95 U/L (ref 39–117)
BUN: 15 mg/dL (ref 6–23)
CHLORIDE: 109 mmol/L (ref 96–112)
CO2: 28 mmol/L (ref 19–32)
Calcium: 8.2 mg/dL — ABNORMAL LOW (ref 8.4–10.5)
Creatinine, Ser: 0.83 mg/dL (ref 0.50–1.10)
GFR calc Af Amer: 70 mL/min — ABNORMAL LOW (ref >90)
GFR, EST NON AFRICAN AMERICAN: 60 mL/min — AB (ref >90)
GLUCOSE: 103 mg/dL — AB (ref 70–99)
Potassium: 3.2 mmol/L — ABNORMAL LOW (ref 3.5–5.1)
SODIUM: 143 mmol/L (ref 135–145)
TOTAL PROTEIN: 6.1 g/dL (ref 6.0–8.3)
Total Bilirubin: 0.5 mg/dL (ref 0.3–1.2)

## 2014-07-10 LAB — URINE MICROSCOPIC-ADD ON

## 2014-07-10 LAB — URINALYSIS, ROUTINE W REFLEX MICROSCOPIC
BILIRUBIN URINE: NEGATIVE
Glucose, UA: NEGATIVE mg/dL
KETONES UR: NEGATIVE mg/dL
Leukocytes, UA: NEGATIVE
Nitrite: NEGATIVE
PROTEIN: NEGATIVE mg/dL
SPECIFIC GRAVITY, URINE: 1.01 (ref 1.005–1.030)
UROBILINOGEN UA: 0.2 mg/dL (ref 0.0–1.0)
pH: 5.5 (ref 5.0–8.0)

## 2014-07-10 LAB — CBC WITH DIFFERENTIAL/PLATELET
BASOS ABS: 0 10*3/uL (ref 0.0–0.1)
Basophils Relative: 0 % (ref 0–1)
EOS ABS: 0.1 10*3/uL (ref 0.0–0.7)
Eosinophils Relative: 2 % (ref 0–5)
HCT: 41.6 % (ref 36.0–46.0)
HEMOGLOBIN: 13.3 g/dL (ref 12.0–15.0)
LYMPHS ABS: 1 10*3/uL (ref 0.7–4.0)
Lymphocytes Relative: 13 % (ref 12–46)
MCH: 29.8 pg (ref 26.0–34.0)
MCHC: 32 g/dL (ref 30.0–36.0)
MCV: 93.3 fL (ref 78.0–100.0)
Monocytes Absolute: 0.8 10*3/uL (ref 0.1–1.0)
Monocytes Relative: 12 % (ref 3–12)
Neutro Abs: 5.2 10*3/uL (ref 1.7–7.7)
Neutrophils Relative %: 73 % (ref 43–77)
PLATELETS: 243 10*3/uL (ref 150–400)
RBC: 4.46 MIL/uL (ref 3.87–5.11)
RDW: 14.3 % (ref 11.5–15.5)
WBC: 7.2 10*3/uL (ref 4.0–10.5)

## 2014-07-10 LAB — LACTIC ACID, PLASMA: LACTIC ACID, VENOUS: 1.5 mmol/L (ref 0.5–2.0)

## 2014-07-10 LAB — LIPASE, BLOOD: LIPASE: 45 U/L (ref 11–59)

## 2014-07-10 MED ORDER — IOHEXOL 300 MG/ML  SOLN
100.0000 mL | Freq: Once | INTRAMUSCULAR | Status: AC | PRN
  Administered 2014-07-10: 100 mL via INTRAVENOUS

## 2014-07-10 MED ORDER — SODIUM CHLORIDE 0.9 % IJ SOLN
INTRAMUSCULAR | Status: AC
  Filled 2014-07-10: qty 60

## 2014-07-10 MED ORDER — SODIUM CHLORIDE 0.9 % IV BOLUS (SEPSIS)
500.0000 mL | Freq: Once | INTRAVENOUS | Status: AC
  Administered 2014-07-10: 500 mL via INTRAVENOUS

## 2014-07-10 MED ORDER — SODIUM CHLORIDE 0.9 % IJ SOLN
INTRAMUSCULAR | Status: DC
Start: 2014-07-10 — End: 2014-07-10
  Filled 2014-07-10: qty 1000

## 2014-07-10 MED ORDER — ONDANSETRON HCL 4 MG/2ML IJ SOLN
4.0000 mg | Freq: Once | INTRAMUSCULAR | Status: AC
  Administered 2014-07-10: 4 mg via INTRAVENOUS
  Filled 2014-07-10: qty 2

## 2014-07-10 MED ORDER — ONDANSETRON 4 MG PO TBDP: ORAL_TABLET | ORAL | Status: DC

## 2014-07-10 NOTE — ED Notes (Signed)
Pt here from [facility] for evaluation of n/v/vd for three days.

## 2014-07-10 NOTE — ED Provider Notes (Signed)
CSN: [id number]     Arrival date & time 07/10/14  1250 History  This chart was scribed for Blane Ohara, MD by Abel Presto, ED Scribe. This patient was seen in room APA09/AP[facility]09 and the patient's care was started at 1:14 PM.    Chief Complaint  Patient presents with  . Abdominal Pain      Patient is a 81 y.o. female presenting with abdominal pain. The history is provided by the patient. No language interpreter was used.  Abdominal Pain Associated symptoms: diarrhea, dysuria, nausea, shortness of breath and vomiting   Associated symptoms: no chest pain and no constipation    HPI Comments: EDITHA BRIDGEFORTH is a 81 y.o. female with PMHx of constipation who presents to the Emergency Department complaining of constant abdominal pain with onset 4 days ago. Pt with h/o of hemorrhoids. She notes she was given a medication to help her go to the restroom 4 days ago.  Pt notes associated diarrhea, dizziness, nausea, vomiting, rectal pain, intermittent SOB and dysuria. Pt notes she feels gassy. Pt denies known hematochezia, rash, chest pain and back pain.   Past Medical History  Diagnosis Date  . Osteoporosis   . High cholesterol   . Constipation   . Glaucoma   . Shortness of breath on exertion    Past Surgical History  Procedure Laterality Date  . Tonsillectomy    . Cesarean section    . Cataract extraction w/phaco Right 01/20/2013    Procedure: CATARACT EXTRACTION PHACO AND INTRAOCULAR LENS PLACEMENT (IOC);  Surgeon: Gemma Payor, MD;  Location: [facility];  Service: Ophthalmology;  Laterality: Right;  CDE:  18.27  . Eye surgery    . Orif hip fracture Right 05/28/2014    Procedure: OPEN REDUCTION INTERNAL FIXATION HIP;  Surgeon: Darreld Mclean, MD;  Location: [facility];  Service: Orthopedics;  Laterality: Right;   No family history on file. History  Substance Use Topics  . Smoking status: Never Smoker   . Smokeless tobacco: Current User    Types: Snuff  . Alcohol Use: No   OB History     Gravida Para Term Preterm AB TAB SAB Ectopic Multiple Living   Review of Systems  Respiratory: Positive for shortness of breath.   Cardiovascular: Negative for chest pain.  Gastrointestinal: Positive for nausea, vomiting, abdominal pain, diarrhea and rectal pain. Negative for constipation.  Genitourinary: Positive for dysuria.  Musculoskeletal: Negative for back pain.  All other systems reviewed and are negative.     Allergies  Review of patient's allergies indicates no known allergies.  Home Medications   Prior to Admission medications   Medication Sig Start Date End Date Taking? Authorizing Provider  aspirin 81 MG chewable tablet Chew 81 mg by mouth daily.     Yes Historical Provider, MD  atorvastatin (LIPITOR) 10 MG tablet Take 10 mg by mouth every evening.    Yes Historical Provider, MD  carisoprodol (SOMA) 350 MG tablet Take 350 mg by mouth at bedtime. *May take one to two additional tablets only if needed For sleep and muscle relaxant   Yes Historical Provider, MD  ciprofloxacin (CIPRO) 500 MG tablet Take 500 mg by mouth 2 (two) times daily. For 7 days. Start date 07/05/14   Yes Historical Provider, MD  clonazePAM (KLONOPIN) 0.5 MG tablet Take 0.5 mg by mouth at bedtime. *May take one to two tablets daily as needed For anxiety  Yes Historical Provider, MD  Dextrose-Sodium Chloride (DEXTROSE 5 % AND 0.45% NACL) infusion Inject 125 mLs into the vein 3 (three) times daily. 125 ml/hr Every shift for dehydration for 2 days. 1 liter. 07/09/14  Yes Historical Provider, MD  HYDROcodone-acetaminophen (NORCO/VICODIN) 5-325 MG per tablet Take 1 tablet by mouth every 4 (four) hours as needed for moderate pain.   Yes Historical Provider, MD  Linaclotide Karlene Einstein) 145 MCG CAPS capsule Take 145 mcg by mouth daily.   Yes Historical Provider, MD  loratadine (CLARITIN) 10 MG tablet Take 10 mg by mouth daily.    Yes Historical Provider, MD  Nutritional Supplements (RESOURCE 2.0)  LIQD Take 90 mLs by mouth 2 (two) times daily.   Yes Historical Provider, MD  ondansetron (ZOFRAN) 4 MG tablet Take 4 mg by mouth every 6 (six) hours as needed for nausea or vomiting.   Yes Historical Provider, MD  enoxaparin (LOVENOX) 40 MG/0.4ML injection Inject 0.4 mLs (40 mg total) into the skin daily. Patient not taking: Reported on 07/10/2014 06/01/14   Butch Penny, MD  ondansetron (ZOFRAN ODT) 4 MG disintegrating tablet  ODT q4 hours prn nausea/vomit 07/10/14   Blane Ohara, MD   BP 126/53 mmHg  Pulse 79  Temp(Src) 97.9 F (36.6 C) (Oral)  Resp 20  Ht  (1.676 m)  Wt 150 lb (68.04 kg)  BMI 24.22 kg/m2  SpO2 92% Physical Exam  Constitutional: She is oriented to person, place, and time. She appears well-developed and well-nourished.  HENT:  Head: Normocephalic.  Mouth/Throat: Mucous membranes are dry.  Eyes: Conjunctivae are normal. Pupils are equal, round, and reactive to light.  Neck: Normal range of motion. Neck supple.  Cardiovascular: Normal rate, regular rhythm and normal heart sounds.   Pulmonary/Chest: Effort normal and breath sounds normal.  Anterior lung fields clear  Abdominal: Bowel sounds are increased. There is tenderness (mild) in the left lower quadrant.  Musculoskeletal: Normal range of motion.  Neurological: She is alert and oriented to person, place, and time.  Skin: Skin is warm and dry.  Psychiatric: She has a normal mood and affect. Her behavior is normal.  Nursing note and vitals reviewed.   ED Course  Procedures (including critical care time) DIAGNOSTIC STUDIES: Oxygen Saturation is 99% on room air, normal by my interpretation.    COORDINATION OF CARE: 1:21 PM Discussed treatment plan with patient at beside, the patient agrees with the plan and has no further questions at this time.   Labs Review Labs Reviewed  COMPREHENSIVE METABOLIC PANEL - Abnormal; Notable for the following:    Potassium 3.2 (*)    Glucose, Bld 103 (*)    Calcium  8.2 (*)    Albumin 3.3 (*)    GFR calc non Af Amer 60 (*)    GFR calc Af Amer 70 (*)    All other components within normal limits  URINALYSIS, ROUTINE W REFLEX MICROSCOPIC - Abnormal; Notable for the following:    Hgb urine dipstick SMALL (*)    All other components within normal limits  CLOSTRIDIUM DIFFICILE BY PCR  CBC WITH DIFFERENTIAL/PLATELET  LIPASE, BLOOD  LACTIC ACID, PLASMA  URINE MICROSCOPIC-ADD ON    Imaging Review Ct Abdomen Pelvis W Contrast  07/10/2014   CLINICAL DATA:  Abdominal pain for 4 days  EXAM: CT ABDOMEN AND PELVIS WITH CONTRAST  TECHNIQUE: Multidetector CT imaging of the abdomen and pelvis was performed using the standard protocol following bolus administration of intravenous contrast.  CONTRAST:  OMNIPAQUE  IOHEXOL 300 MG/ML  SOLN  COMPARISON:  None.  FINDINGS: Lung bases are free of acute infiltrate or sizable effusion.  The liver, gallbladder, spleen, adrenal glands and pancreas are normal in their CT appearance. Kidneys demonstrate a normal enhancement pattern. A small cyst is noted in the lower pole of the left kidney. No calculus is identified. The appendix is within normal limits. No inflammatory changes are seen.  The bladder is partially distended. The uterus is small consistent with the postmenopausal state. Changes of diverticulosis are seen. No evidence of diverticulitis is noted. No free pelvic fluid is seen. Postsurgical changes in the right hip are noted. Degenerative changes of the lumbar spine are noted.  IMPRESSION: No acute abnormality noted.   Electronically Signed   By: Alcide CleverMark  Lukens M.D.   On: 07/10/2014 16:12     EKG Interpretation None      MDM   Final diagnoses:  Left lateral abdominal pain  Nausea vomiting and diarrhea   I personally performed the services described in this documentation, which was scribed in my presence. The recorded information has been reviewed and is accurate.  Concern clinically for gastroenteritis first  colitis versus diverticulitis. IV fluids antiemetics, labs and CT abdomen pending. Possible recent antibiotics, C. difficile ordered.  Patient improved clinically in the ER. IV fluids given. CT scan results reviewed no acute findings. Outpatient follow-up discussed  Results and differential diagnosis were discussed with the patient/parent/guardian. Close follow up outpatient was discussed, comfortable with the plan.   Medications  ondansetron (ZOFRAN) injection 4 mg (4 mg Intravenous Given 07/10/14 1328)  sodium chloride 0.9 % bolus 500 mL (0 mLs Intravenous Stopped 07/10/14 1534)  iohexol (OMNIPAQUE) 300 MG/ML solution 100 mL (100 mLs Intravenous Contrast Given 07/10/14 1552)    Filed Vitals:   07/10/14 1415 07/10/14 1430 07/10/14 1500 07/10/14 1530  BP:  118/49 135/58 126/53  Pulse: 79     Temp:      TempSrc:      Resp:      Height:      Weight:      SpO2: 92%       Final diagnoses:  Left lateral abdominal pain  Nausea vomiting and diarrhea      Blane OharaJoshua Pavle Wiler, MD 07/10/14 1641

## 2014-07-10 NOTE — Discharge Instructions (Signed)
Take Tylenol for pain and Zofran as needed for nausea.  If you were given medicines take as directed.  If you are on coumadin or contraceptives realize their levels and effectiveness is altered by many different medicines.  If you have any reaction (rash, tongues swelling, other) to the medicines stop taking and see a physician.   Please follow up as directed and return to the ER or see a physician for new or worsening symptoms.  Thank you. Filed Vitals:   07/10/14 1415 07/10/14 1430 07/10/14 1500 07/10/14 1530  BP:  118/49 135/58 126/53  Pulse: 79     Temp:      TempSrc:      Resp:      Height:      Weight:      SpO2: 92%

## 2014-07-30 ENCOUNTER — Ambulatory Visit (HOSPITAL_COMMUNITY)
Admission: RE | Admit: 2014-07-30 | Discharge: 2014-07-30 | Disposition: A | Discharge status: Home / Self Care | Payer: Medicare Other | Source: Ambulatory Visit | Attending: Orthopaedic Surgery | Admitting: Orthopaedic Surgery

## 2014-07-30 ENCOUNTER — Other Ambulatory Visit (HOSPITAL_COMMUNITY): Payer: Self-pay | Admitting: Orthopaedic Surgery

## 2014-07-30 DIAGNOSIS — X58XXXD Exposure to other specified factors, subsequent encounter: Secondary | ICD-10-CM | POA: Diagnosis not present

## 2014-07-30 DIAGNOSIS — S7291XD Unspecified fracture of right femur, subsequent encounter for closed fracture with routine healing: Secondary | ICD-10-CM

## 2014-07-30 DIAGNOSIS — S72001D Fracture of unspecified part of neck of right femur, subsequent encounter for closed fracture with routine healing: Secondary | ICD-10-CM | POA: Diagnosis not present

## 2014-11-10 IMAGING — CR DG FINGER THUMB 2+V*L*
1 series · 1 of 1 positions shown · non-contrast
Comparison: None

CLINICAL DATA: LEFT thumb pain and bruising, fell last night,
syncope

EXAM:
LEFT THUMB 2+V

[view not recorded]
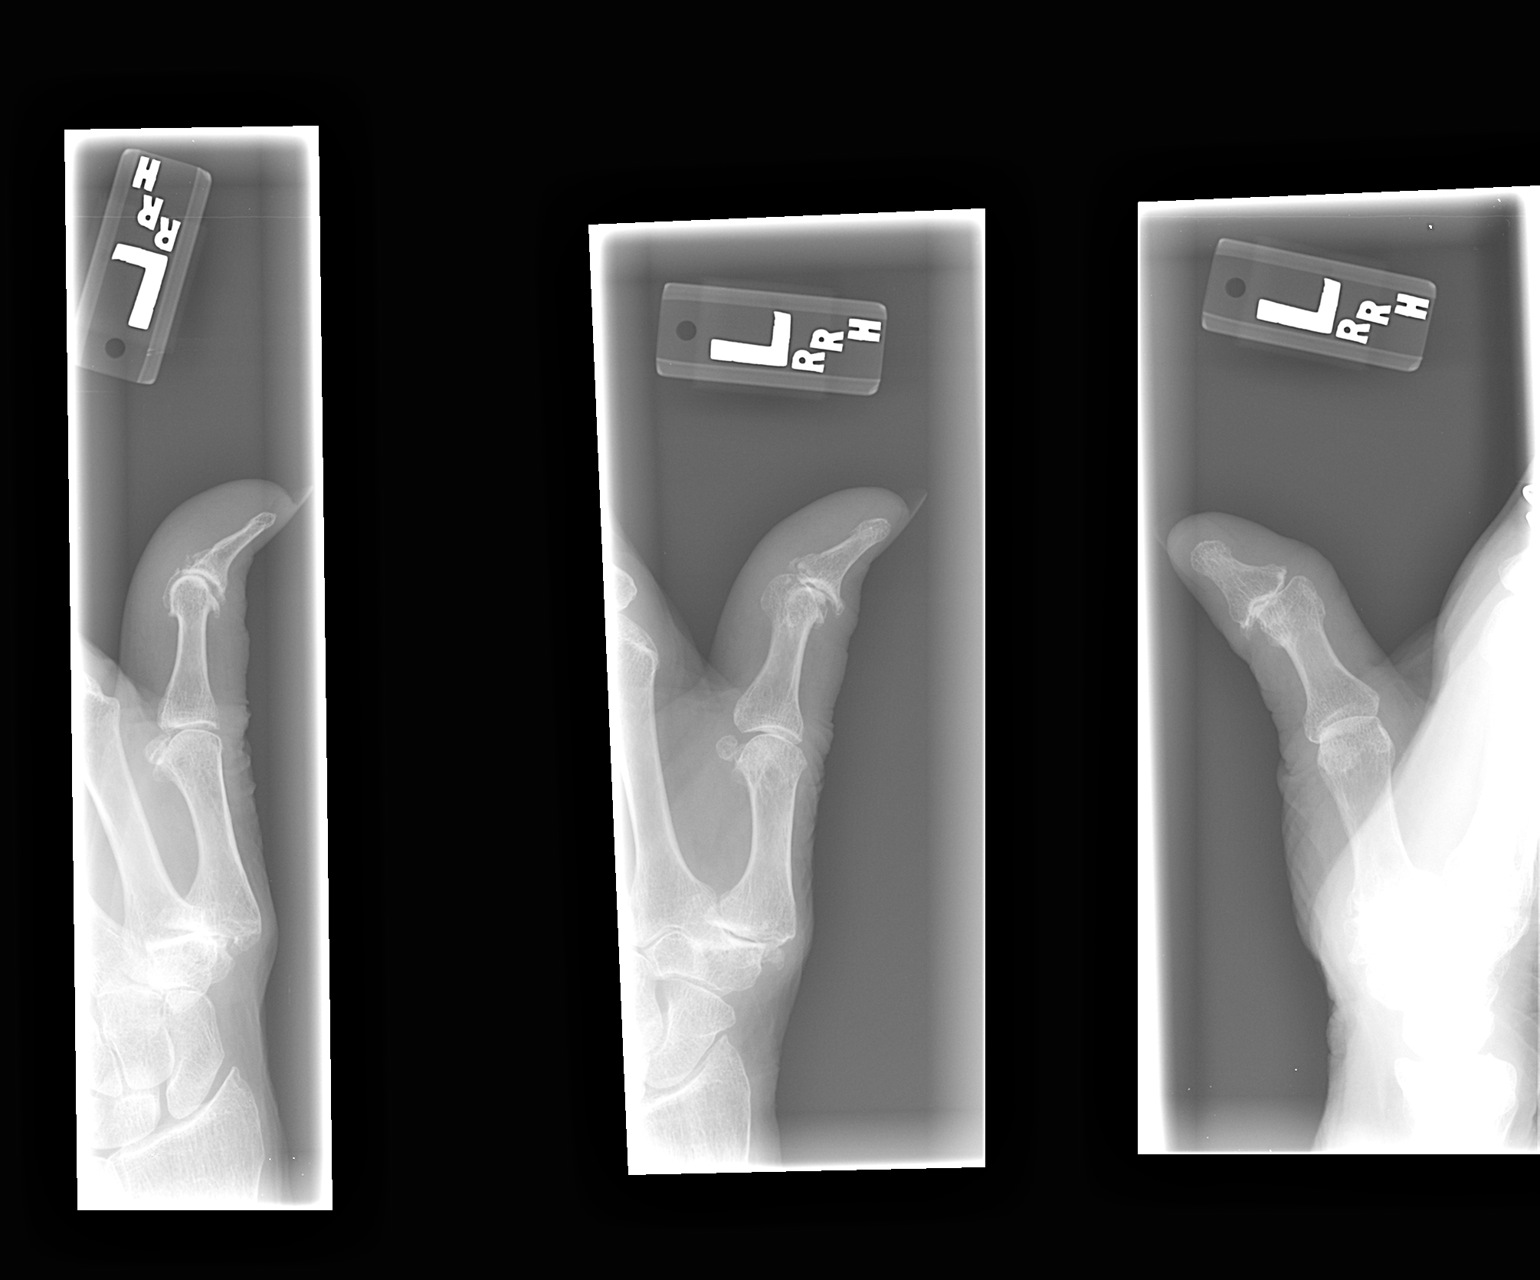

[1 of 1 positions shown; findings below may reference images not displayed]

FINDINGS: Osseous demineralization.

Advanced degenerative changes at IP joint thumb with joint space
narrowing and spur formation.

Slight radial subluxation at the IP joint.

Small calcific densities, slightly irregular in appearance, are seen
at the volar aspect of the base of the distal phalanx, question
related to spur formation or adjacent soft tissue
calcification/debris though it is difficult to completely exclude
fracture.

No additional fracture or dislocation.
IMPRESSION: Advanced degenerative changes of the IP joint LEFT thumb though it
is difficult to completely exclude subtle fracture at the volar
aspect of the base of the distal phalanx as discussed above.

## 2014-11-11 ENCOUNTER — Encounter (HOSPITAL_COMMUNITY): Payer: Self-pay | Admitting: Emergency Medicine

## 2014-11-11 ENCOUNTER — Inpatient Hospital Stay (HOSPITAL_COMMUNITY)
Admission: EM | Admit: 2014-11-11 | Discharge: 2014-11-13 | DRG: 641 | Disposition: A | Discharge status: Home / Self Care | Payer: Medicare Other | Attending: Family Medicine | Admitting: Family Medicine

## 2014-11-11 ENCOUNTER — Emergency Department (HOSPITAL_COMMUNITY): Payer: Medicare Other

## 2014-11-11 DIAGNOSIS — M25552 Pain in left hip: Secondary | ICD-10-CM | POA: Diagnosis present

## 2014-11-11 DIAGNOSIS — M25551 Pain in right hip: Secondary | ICD-10-CM | POA: Diagnosis present

## 2014-11-11 DIAGNOSIS — Z66 Do not resuscitate: Secondary | ICD-10-CM | POA: Diagnosis present

## 2014-11-11 DIAGNOSIS — Z79899 Other long term (current) drug therapy: Secondary | ICD-10-CM

## 2014-11-11 DIAGNOSIS — H409 Unspecified glaucoma: Secondary | ICD-10-CM | POA: Diagnosis present

## 2014-11-11 DIAGNOSIS — T1490 Injury, unspecified: Secondary | ICD-10-CM | POA: Diagnosis present

## 2014-11-11 DIAGNOSIS — R296 Repeated falls: Secondary | ICD-10-CM | POA: Diagnosis present

## 2014-11-11 DIAGNOSIS — W19XXXA Unspecified fall, initial encounter: Secondary | ICD-10-CM | POA: Diagnosis present

## 2014-11-11 DIAGNOSIS — Y92009 Unspecified place in unspecified non-institutional (private) residence as the place of occurrence of the external cause: Secondary | ICD-10-CM | POA: Diagnosis not present

## 2014-11-11 DIAGNOSIS — Z79891 Long term (current) use of opiate analgesic: Secondary | ICD-10-CM | POA: Diagnosis not present

## 2014-11-11 DIAGNOSIS — Y92003 Bedroom of unspecified non-institutional (private) residence as the place of occurrence of the external cause: Secondary | ICD-10-CM | POA: Diagnosis not present

## 2014-11-11 DIAGNOSIS — M810 Age-related osteoporosis without current pathological fracture: Secondary | ICD-10-CM | POA: Diagnosis present

## 2014-11-11 DIAGNOSIS — I10 Essential (primary) hypertension: Secondary | ICD-10-CM | POA: Diagnosis present

## 2014-11-11 DIAGNOSIS — M545 Low back pain: Secondary | ICD-10-CM | POA: Diagnosis present

## 2014-11-11 DIAGNOSIS — E785 Hyperlipidemia, unspecified: Secondary | ICD-10-CM | POA: Diagnosis present

## 2014-11-11 DIAGNOSIS — F17220 Nicotine dependence, chewing tobacco, uncomplicated: Secondary | ICD-10-CM | POA: Diagnosis present

## 2014-11-11 DIAGNOSIS — E876 Hypokalemia: Secondary | ICD-10-CM | POA: Diagnosis not present

## 2014-11-11 DIAGNOSIS — Z7982 Long term (current) use of aspirin: Secondary | ICD-10-CM

## 2014-11-11 DIAGNOSIS — M6282 Rhabdomyolysis: Secondary | ICD-10-CM | POA: Diagnosis not present

## 2014-11-11 LAB — URINALYSIS, ROUTINE W REFLEX MICROSCOPIC
Bilirubin Urine: NEGATIVE
Glucose, UA: NEGATIVE mg/dL
Ketones, ur: NEGATIVE mg/dL
Leukocytes, UA: NEGATIVE
Nitrite: NEGATIVE
PH: 5.5 (ref 5.0–8.0)
PROTEIN: NEGATIVE mg/dL
Specific Gravity, Urine: 1.015 (ref 1.005–1.030)
Urobilinogen, UA: 0.2 mg/dL (ref 0.0–1.0)

## 2014-11-11 LAB — COMPREHENSIVE METABOLIC PANEL
ALT: 15 U/L (ref 14–54)
AST: 32 U/L (ref 15–41)
Albumin: 4.1 g/dL (ref 3.5–5.0)
Alkaline Phosphatase: 69 U/L (ref 38–126)
Anion gap: 9 (ref 5–15)
BILIRUBIN TOTAL: 0.9 mg/dL (ref 0.3–1.2)
BUN: 12 mg/dL (ref 6–20)
CO2: 25 mmol/L (ref 22–32)
Calcium: 8.5 mg/dL — ABNORMAL LOW (ref 8.9–10.3)
Chloride: 109 mmol/L (ref 101–111)
Creatinine, Ser: 0.63 mg/dL (ref 0.44–1.00)
GFR calc non Af Amer: >60 mL/min (ref >60)
Glucose, Bld: 105 mg/dL — ABNORMAL HIGH (ref 65–99)
POTASSIUM: 2.7 mmol/L — AB (ref 3.5–5.1)
Sodium: 143 mmol/L (ref 135–145)
Total Protein: 6.8 g/dL (ref 6.5–8.1)

## 2014-11-11 LAB — CBC WITH DIFFERENTIAL/PLATELET
Basophils Absolute: 0 10*3/uL (ref 0.0–0.1)
Basophils Relative: 0 % (ref 0–1)
EOS ABS: 0 10*3/uL (ref 0.0–0.7)
EOS PCT: 0 % (ref 0–5)
HEMATOCRIT: 43.7 % (ref 36.0–46.0)
Hemoglobin: 14 g/dL (ref 12.0–15.0)
LYMPHS ABS: 0.8 10*3/uL (ref 0.7–4.0)
Lymphocytes Relative: 8 % — ABNORMAL LOW (ref 12–46)
MCH: 28.7 pg (ref 26.0–34.0)
MCHC: 32 g/dL (ref 30.0–36.0)
MCV: 89.5 fL (ref 78.0–100.0)
MONO ABS: 0.9 10*3/uL (ref 0.1–1.0)
Monocytes Relative: 9 % (ref 3–12)
NEUTROS PCT: 83 % — AB (ref 43–77)
Neutro Abs: 8.2 10*3/uL — ABNORMAL HIGH (ref 1.7–7.7)
Platelets: 192 10*3/uL (ref 150–400)
RBC: 4.88 MIL/uL (ref 3.87–5.11)
RDW: 15 % (ref 11.5–15.5)
WBC: 10 10*3/uL (ref 4.0–10.5)

## 2014-11-11 LAB — URINE MICROSCOPIC-ADD ON

## 2014-11-11 LAB — CK: Total CK: 727 U/L — ABNORMAL HIGH (ref 38–234)

## 2014-11-11 MED ORDER — POTASSIUM CHLORIDE 10 MEQ/100ML IV SOLN
10.0000 meq | Freq: Once | INTRAVENOUS | Status: AC
  Administered 2014-11-11: 10 meq via INTRAVENOUS

## 2014-11-11 MED ORDER — ACETAMINOPHEN 325 MG PO TABS: 650.0000 mg | ORAL_TABLET | Freq: Four times a day (QID) | ORAL | Status: DC | PRN

## 2014-11-11 MED ORDER — BISACODYL 5 MG PO TBEC: 5.0000 mg | DELAYED_RELEASE_TABLET | Freq: Every day | ORAL | Status: DC | PRN

## 2014-11-11 MED ORDER — HYDROCODONE-ACETAMINOPHEN 7.5-325 MG PO TABS
1.0000 | ORAL_TABLET | Freq: Four times a day (QID) | ORAL | Status: DC | PRN
  Administered 2014-11-11 – 2014-11-13 (×6): 1 via ORAL
  Filled 2014-11-11 (×6): qty 1

## 2014-11-11 MED ORDER — ENOXAPARIN SODIUM 40 MG/0.4ML ~~LOC~~ SOLN
40.0000 mg | SUBCUTANEOUS | Status: DC
  Administered 2014-11-11 – 2014-11-12 (×2): 40 mg via SUBCUTANEOUS
  Filled 2014-11-11 (×2): qty 0.4

## 2014-11-11 MED ORDER — ATORVASTATIN CALCIUM 10 MG PO TABS
10.0000 mg | ORAL_TABLET | Freq: Every evening | ORAL | Status: DC
  Administered 2014-11-11 – 2014-11-12 (×2): 10 mg via ORAL
  Filled 2014-11-11 (×2): qty 1

## 2014-11-11 MED ORDER — HYDROCODONE-ACETAMINOPHEN 5-325 MG PO TABS
1.0000 | ORAL_TABLET | Freq: Once | ORAL | Status: AC
  Administered 2014-11-11: 1 via ORAL
  Filled 2014-11-11: qty 1

## 2014-11-11 MED ORDER — CARISOPRODOL 350 MG PO TABS
350.0000 mg | ORAL_TABLET | Freq: Every day | ORAL | Status: DC
  Administered 2014-11-11 – 2014-11-12 (×2): 350 mg via ORAL
  Filled 2014-11-11 (×2): qty 1

## 2014-11-11 MED ORDER — POTASSIUM CHLORIDE CRYS ER 20 MEQ PO TBCR
30.0000 meq | EXTENDED_RELEASE_TABLET | Freq: Once | ORAL | Status: AC
  Administered 2014-11-11: 30 meq via ORAL
  Filled 2014-11-11 (×2): qty 1

## 2014-11-11 MED ORDER — POLYETHYLENE GLYCOL 3350 17 G PO PACK: 17.0000 g | PACK | Freq: Every day | ORAL | Status: DC | PRN

## 2014-11-11 MED ORDER — SODIUM CHLORIDE 0.9 % IV BOLUS (SEPSIS)
250.0000 mL | Freq: Once | INTRAVENOUS | Status: AC
  Administered 2014-11-11: 250 mL via INTRAVENOUS

## 2014-11-11 MED ORDER — CLONAZEPAM 0.5 MG PO TABS
0.5000 mg | ORAL_TABLET | Freq: Every day | ORAL | Status: DC
  Administered 2014-11-11 – 2014-11-12 (×2): 0.5 mg via ORAL
  Filled 2014-11-11 (×2): qty 1

## 2014-11-11 MED ORDER — POTASSIUM CHLORIDE 10 MEQ/100ML IV SOLN
INTRAVENOUS | Status: AC
  Administered 2014-11-11: 10 meq via INTRAVENOUS
  Filled 2014-11-11: qty 100

## 2014-11-11 MED ORDER — POTASSIUM CHLORIDE CRYS ER 20 MEQ PO TBCR
40.0000 meq | EXTENDED_RELEASE_TABLET | Freq: Once | ORAL | Status: AC
  Administered 2014-11-11: 40 meq via ORAL
  Filled 2014-11-11: qty 2

## 2014-11-11 MED ORDER — SODIUM CHLORIDE 0.9 % IV SOLN
INTRAVENOUS | Status: DC
  Administered 2014-11-11 – 2014-11-12 (×3): via INTRAVENOUS

## 2014-11-11 MED ORDER — SODIUM CHLORIDE 0.9 % IV SOLN: INTRAVENOUS | Status: AC

## 2014-11-11 MED ORDER — ONDANSETRON 4 MG PO TBDP: 4.0000 mg | ORAL_TABLET | Freq: Three times a day (TID) | ORAL | Status: DC | PRN

## 2014-11-11 MED ORDER — ACETAMINOPHEN 650 MG RE SUPP: 650.0000 mg | Freq: Four times a day (QID) | RECTAL | Status: DC | PRN

## 2014-11-11 MED ORDER — POTASSIUM CHLORIDE 10 MEQ/100ML IV SOLN
10.0000 meq | Freq: Once | INTRAVENOUS | Status: AC
  Administered 2014-11-11: 10 meq via INTRAVENOUS
  Filled 2014-11-11: qty 100

## 2014-11-11 MED ORDER — ASPIRIN 81 MG PO CHEW
81.0000 mg | CHEWABLE_TABLET | Freq: Every day | ORAL | Status: DC
  Administered 2014-11-11 – 2014-11-13 (×3): 81 mg via ORAL
  Filled 2014-11-11 (×3): qty 1

## 2014-11-11 MED ORDER — SODIUM CHLORIDE 0.9 % IJ SOLN
3.0000 mL | Freq: Two times a day (BID) | INTRAMUSCULAR | Status: DC
  Administered 2014-11-11: 3 mL via INTRAVENOUS

## 2014-11-11 NOTE — ED Notes (Signed)
CRITICAL VALUE ALERT  Critical value received:  Potassium 2.7  Date of notification:  11/11/2014  Time of notification:  1000  Critical value read back: Yes  Nurse who received alert:  Merleen Millinerhristina Rajon Bisig, RN   MD notified (1st page):  MD Milford CageZackowsky

## 2014-11-11 NOTE — ED Notes (Signed)
Hospitalist at bedside 

## 2014-11-11 NOTE — ED Notes (Signed)
MD Zackowsky at bedside.  

## 2014-11-11 NOTE — H&P (Signed)
History and Physical    Alexandra Riley:[id number] DOB: 03/19/1922 DOA: 11/11/2014  Referring physician: Dr. Deretha Emory PCP: [name], MD  Specialists: none  Chief Complaint: fall  HPI: Alexandra Riley is a 81 y.o. female has a past medical history significant for HLD, HTN, osteoporosis, is being brought to the ED by her family as she was found down in the bedroom this morning. Patient tells me that she fell on her back last night, she was unable to get up and spend the night on the floor. She denies any syncopal episode, and she remembers how she fell. She denies any recent fever or chills. Currently she is endorsing back pain as well as lateral hip pain. She has a history of recurrent falls, and had a hip fracture in February of this year which was repaired. She lives by herself, and family is concerned about her ability to continue to live independently. She denies any chest pain, she denies any shortness of breath, she denies any cough or chest congestion, she denies any dysuria. She denies any lightheadedness or dizziness. She endorses a poor appetite, she endorses lack of appetite and tells me she hasn't been able to drink anything overnight. In the emergency room, her blood work reveals significant hypokalemia with a potassium of 2.7. She underwent a chest x-ray which was without acute findings, as well as CT of the head, C-spine, and L-spine, Without acute fractures.CK is pending. [facility] was asked for admission for recurrent fall, hypokalemia.   Review of Systems: as per HPI, otherwise 10 point ROS negative  Past Medical History  Diagnosis Date  . Osteoporosis   . High cholesterol   . Constipation   . Glaucoma   . Shortness of breath on exertion    Past Surgical History  Procedure Laterality Date  . Tonsillectomy    . Cesarean section    . Cataract extraction w/phaco Right 01/20/2013    Procedure: CATARACT EXTRACTION PHACO AND INTRAOCULAR LENS PLACEMENT (IOC);  Surgeon:  Gemma Payor, MD;  Location: [facility];  Service: Ophthalmology;  Laterality: Right;  CDE:  18.27  . Eye surgery    . Orif hip fracture Right 05/28/2014    Procedure: OPEN REDUCTION INTERNAL FIXATION HIP;  Surgeon: Alexandra Mclean, MD;  Location: [facility];  Service: Orthopedics;  Laterality: Right;   Social History:  reports that she has never smoked. Her smokeless tobacco use includes Snuff. She reports that she does not drink alcohol or use illicit drugs.  No Known Allergies  Family history reviewed and non contributory   Prior to Admission medications   Medication Sig Start Date End Date Taking? Authorizing Provider  aspirin 81 MG chewable tablet Chew 81 mg by mouth daily.     Yes Historical Provider, MD  atorvastatin (LIPITOR) 10 MG tablet Take 10 mg by mouth every evening.    Yes Historical Provider, MD  carisoprodol (SOMA) 350 MG tablet Take 350 mg by mouth at bedtime. *May take one to two additional tablets only if needed For sleep and muscle relaxant   Yes Historical Provider, MD  clonazePAM (KLONOPIN) 0.5 MG tablet Take 0.5 mg by mouth at bedtime. *May take one to two tablets daily as needed For anxiety   Yes Historical Provider, MD  HYDROcodone-acetaminophen (NORCO) 7.5-325 MG per tablet Take 1 tablet by mouth 4 (four) times daily. 11/05/14  Yes Historical Provider, MD  ondansetron (ZOFRAN ODT) 4 MG disintegrating tablet 4mg  ODT q4 hours prn nausea/vomit Patient not taking: Reported  on 11/11/2014 07/10/14   Blane OharaJoshua Zavitz, MD   Physical Exam: Filed Vitals:   11/11/14 1230 11/11/14 1300 11/11/14 1315 11/11/14 1330  BP: 142/66 162/78  154/66  Pulse: 72  69 71  Temp:      TempSrc:      Resp: 15 22 18 16   Height:      Weight:      SpO2: 99%  99% 100%     General:  No apparent distress  Eyes: PERRL, EOMI, no scleral icterus  ENT: moist oropharynx  Neck: supple, no lymphadenopathy  Cardiovascular: regular rate without MRG; 2+ peripheral pulses, no JVD, no peripheral  edema  Respiratory: CTA biL, good air movement without wheezing, rhonchi or crackled  Abdomen: soft, non tender to palpation, positive bowel sounds, no guarding, no rebound  Skin: no rashes  Musculoskeletal: normal bulk and tone, no joint swelling  Psychiatric: normal mood and affect  Neurologic: CN 2-12 grossly intact, MS 5/5 in all 4  Labs on Admission:  Basic Metabolic Panel:  Recent Labs Lab 11/11/14 0947  NA 143  K 2.7*  CL 109  CO2 25  GLUCOSE 105*  BUN 12  CREATININE 0.63  CALCIUM 8.5*   Liver Function Tests:  Recent Labs Lab 11/11/14 0947  AST 32  ALT 15  ALKPHOS 69  BILITOT 0.9  PROT 6.8  ALBUMIN 4.1   No results for input(s): LIPASE, AMYLASE in the last 168 hours. No results for input(s): AMMONIA in the last 168 hours. CBC:  Recent Labs Lab 11/11/14 0947  WBC 10.0  NEUTROABS 8.2*  HGB 14.0  HCT 43.7  MCV 89.5  PLT 192   Cardiac Enzymes: No results for input(s): CKTOTAL, CKMB, CKMBINDEX, TROPONINI in the last 168 hours.  BNP (last 3 results) No results for input(s): BNP in the last 8760 hours.  ProBNP (last 3 results) No results for input(s): PROBNP in the last 8760 hours.  CBG: No results for input(s): GLUCAP in the last 168 hours.  Radiological Exams on Admission: Dg Chest 1 View  11/11/2014   CLINICAL DATA:  Neck pain.  Patient found down.  EXAM: CHEST  2 VIEW  COMPARISON:  05/26/2014  FINDINGS: Atherosclerotic calcification and tortuosity of the aortic arch. Bony demineralization. Chronic low grade upper lobe interstitial accentuation, blind laterally symmetric. Borderline cardiomegaly, cardiothoracic index 57% on [facility]ography.  Stable mid thoracic compression fracture compared to prior.  IMPRESSION: 1. No acute findings. 2. Chronic mid thoracic compression fracture. 3. Aortic atherosclerotic calcification. 4. Chronic faint interstitial accentuation in the lung apices.   Electronically Signed   By: Gaylyn RongWalter  Liebkemann M.D.   On:  11/11/2014 10:38   Dg Lumbar Spine Complete  11/11/2014   CLINICAL DATA:  Patient found down on floor.  Bilateral leg pain.  EXAM: LUMBAR SPINE - COMPLETE 4+ VIEW  COMPARISON:  07/10/2014  FINDINGS: Mild dextroconvex lumbar scoliosis. Bony demineralization. Degenerative lower lumbar facet arthropathy.  Reduced sensitivity and specificity of the lateral projection due to oblique rotation. I suspect a superior endplate compression at L1 of, not readily apparent on the prior CT scan from 07/10/2014.  Right anterior lower rib deformities are stable from 07/10/2014, compatible with prior rib fractures.  IMPRESSION: 1. Suspected mild superior endplate compression fracture at L1, not readily apparent on the prior CT scan from 07/10/2014. The degree of oblique rotation on the lateral projection reduces positive predictive value. 2. Old right anterior rib fractures. 3. Bony demineralization.   Electronically Signed   By: Zollie BeckersWalter  Ova Freshwater M.D.   On: 11/11/2014 10:42   Ct Head Wo Contrast  11/11/2014   CLINICAL DATA:  Found on floor.  Neck pain.  EXAM: CT HEAD WITHOUT CONTRAST  CT CERVICAL SPINE WITHOUT CONTRAST  TECHNIQUE: Multidetector CT imaging of the head and cervical spine was performed following the standard protocol without intravenous contrast. Multiplanar CT image reconstructions of the cervical spine were also generated.  COMPARISON:  05/26/2014  FINDINGS: CT HEAD FINDINGS  Stable cerebral atrophy. There may be a small infarct in the right white matter tract that appears to be chronic. Stable low-density in the periventricular white matter. Mild mucosal thickening in ethmoid air cells. Otherwise, the paranasal sinuses are clear. No acute bone abnormality. No evidence for acute hemorrhage, mass lesion, midline shift, hydrocephalus or large infarct.  CT CERVICAL SPINE FINDINGS  Mandibular condyles are located. Scarring at the lung apices. No soft tissue swelling in the neck. There is marked lordosis in the  cervical spine. Negative for an acute fracture or dislocation. There is mild facet arthropathy.  IMPRESSION: No acute intracranial abnormality.  Stable cerebral atrophy.  Stable white matter changes.  No acute bone abnormality in the cervical spine.   Electronically Signed   By: Richarda Overlie M.D.   On: 11/11/2014 10:55   Ct Cervical Spine Wo Contrast  11/11/2014   CLINICAL DATA:  Found on floor.  Neck pain.  EXAM: CT HEAD WITHOUT CONTRAST  CT CERVICAL SPINE WITHOUT CONTRAST  TECHNIQUE: Multidetector CT imaging of the head and cervical spine was performed following the standard protocol without intravenous contrast. Multiplanar CT image reconstructions of the cervical spine were also generated.  COMPARISON:  05/26/2014  FINDINGS: CT HEAD FINDINGS  Stable cerebral atrophy. There may be a small infarct in the right white matter tract that appears to be chronic. Stable low-density in the periventricular white matter. Mild mucosal thickening in ethmoid air cells. Otherwise, the paranasal sinuses are clear. No acute bone abnormality. No evidence for acute hemorrhage, mass lesion, midline shift, hydrocephalus or large infarct.  CT CERVICAL SPINE FINDINGS  Mandibular condyles are located. Scarring at the lung apices. No soft tissue swelling in the neck. There is marked lordosis in the cervical spine. Negative for an acute fracture or dislocation. There is mild facet arthropathy.  IMPRESSION: No acute intracranial abnormality.  Stable cerebral atrophy.  Stable white matter changes.  No acute bone abnormality in the cervical spine.   Electronically Signed   By: Richarda Overlie M.D.   On: 11/11/2014 10:55   Ct Lumbar Spine Wo Contrast  11/11/2014   CLINICAL DATA:  Conventional radiographs raise the possibility of an L1 compression fracture. Fall.  EXAM: CT LUMBAR SPINE WITHOUT CONTRAST  TECHNIQUE: Multidetector CT imaging of the lumbar spine was performed without intravenous contrast administration. Multiplanar CT image  reconstructions were also generated.  COMPARISON:  11/11/2014 radiograph  FINDINGS: The lowest lumbar type non-rib-bearing vertebra is labeled as L5.  Bony demineralization is present.  3 mm degenerative retrolisthesis at T12 all 1. 3 mm degenerative anterolisthesis at L2-3.  No acute superior endplate compression fracture at L1 is confirmed. The appearance on radiography was likely due to obliquity and spurring. No fracture elsewhere in the lumbar spine is identified.  Aortoiliac atherosclerotic vascular disease. Dextroconvex rotary scoliosis. I do not see a definite sacral fracture.  Additional findings at individual levels are as follows:  T12-L1: Diffuse disc bulge.  L1-2: Mild left foraminal stenosis due to left eccentric disc bulge and facet arthropathy.  L2-3: Mild left and borderline right foraminal stenosis and mild central narrowing of the thecal sac due to disc bulge, intervertebral spurring, and left foraminal and lateral extraforaminal disc protrusion.  L3-4: Moderate central narrowing of the thecal sac and mild bilateral foraminal stenosis due to disc bulge, central disc protrusion, and facet arthropathy.  L4-5:  No impingement.  Disc bulge noted.  L5-S1:  No impingement.  Disc bulge noted.  IMPRESSION: 1. There is no superior endplate compression fracture at L1. The appearance on radiography was spurious and due to spurring and rotation. 2. Lumbar spondylosis and degenerative disc disease, causing moderate impingement at L3-4 and mild impingement at L1-2 and L2-3, as detailed above. 3. Bony demineralization. 4. Dextroconvex rotary lumbar scoliosis. 5.  Aortoiliac atherosclerotic vascular disease.   Electronically Signed   By: Gaylyn Rong M.D.   On: 11/11/2014 11:33   Dg Hips Bilat With Pelvis Min 5 Views  11/11/2014   CLINICAL DATA:  Pain following fall  EXAM: DG HIP (WITH OR WITHOUT PELVIS) 5+V BILAT  COMPARISON:  June 18, 2014  FINDINGS: Frontal pelvis as well as frontal and lateral  hip views bilaterally obtained. The patient has had a previous fracture through the immediate sub trochanteric region of the proximal right femur. There is screw and plate fixation in this area. There is evidence of healing with bony remodeling in this area. There were prior a avulsions of the greater and lesser trochanter interval callus in these areas.  There is no acute fracture or dislocation. Joint spaces appear intact. Bones are osteoporotic.  IMPRESSION: Old fracture of the proximal right femur with remodeling and healing. No acute fractures or dislocations. Joint spaces appear intact. Bones are osteoporotic.   Electronically Signed   By: Bretta Bang III M.D.   On: 11/11/2014 10:43    EKG: Independently reviewed. Sinus rhythm  Assessment/Plan Active Problems:   Hypokalemia   Recurrent falls   Rhabdomyolysis   Rhambodymyolisis - IV hydration, repeat CK in am   Recurrent falls  - quite problematic as she lives alone, has osteoporosis. - PT to evaluate  Hypokalemia - replete both IV and po - monitor on telemetry   HLD - continue statin   Diet: heart healthy Fluids: NS DVT Prophylaxis: Lovenox  Code Status: DNR  Family Communication: d/w daughter bedside  Disposition Plan: admit to tele  Time spent: 51  Costin M. Elvera Lennox, MD [facility] Pager [phone]  If 7PM-7AM, please contact night-coverage [email or web address] Password [email or web address] 11/11/2014, 1:51 PM

## 2014-11-11 NOTE — ED Provider Notes (Addendum)
CSN: [id number]     Arrival date & time 11/11/14  0902 History   First MD Initiated Contact with Patient 11/11/14 0902     Chief Complaint  Patient presents with  . Fall     (Consider location/radiation/quality/duration/timing/severity/associated sxs/prior Treatment) The history is provided by the patient, a relative and the EMS personnel.   81 year old female status post fall at home last evening fell backwards next to the bed spent the night on the floor in the bedroom. Discovered by family this morning and brought in by EMS. Patient lives by herself. Patient with complaint of neck pain back pain and hip pain. Patient also complained of leg cramps. Patient remembers falling backwards. Apparently no loss of consciousness.  Past Medical History  Diagnosis Date  . Osteoporosis   . High cholesterol   . Constipation   . Glaucoma   . Shortness of breath on exertion    Past Surgical History  Procedure Laterality Date  . Tonsillectomy    . Cesarean section    . Cataract extraction w/phaco Right 01/20/2013    Procedure: CATARACT EXTRACTION PHACO AND INTRAOCULAR LENS PLACEMENT (IOC);  Surgeon: Gemma Payor, MD;  Location: [facility];  Service: Ophthalmology;  Laterality: Right;  CDE:  18.27  . Eye surgery    . Orif hip fracture Right 05/28/2014    Procedure: OPEN REDUCTION INTERNAL FIXATION HIP;  Surgeon: Darreld Mclean, MD;  Location: [facility];  Service: Orthopedics;  Laterality: Right;   History reviewed. No pertinent family history. History  Substance Use Topics  . Smoking status: Never Smoker   . Smokeless tobacco: Current User    Types: Snuff  . Alcohol Use: No   OB History    Gravida Para Term Preterm AB TAB SAB Ectopic Multiple Living   4 3 3       3      Review of Systems  Constitutional: Negative for fever.  HENT: Negative for congestion.   Eyes: Negative for visual disturbance.  Respiratory: Negative for shortness of breath.   Cardiovascular: Negative for chest pain.   Gastrointestinal: Negative for abdominal pain.  Genitourinary: Negative for dysuria.  Musculoskeletal: Positive for myalgias, back pain and neck pain.  Skin: Negative for rash.  Neurological: Negative for headaches.  Hematological: Does not bruise/bleed easily.  Psychiatric/Behavioral: Negative for confusion.      Allergies  Review of patient's allergies indicates no known allergies.  Home Medications   Prior to Admission medications   Medication Sig Start Date End Date Taking? Authorizing Provider  aspirin 81 MG chewable tablet Chew 81 mg by mouth daily.     Yes Historical Provider, MD  atorvastatin (LIPITOR) 10 MG tablet Take 10 mg by mouth every evening.    Yes Historical Provider, MD  carisoprodol (SOMA) 350 MG tablet Take 350 mg by mouth at bedtime. *May take one to two additional tablets only if needed For sleep and muscle relaxant   Yes Historical Provider, MD  clonazePAM (KLONOPIN) 0.5 MG tablet Take 0.5 mg by mouth at bedtime. *May take one to two tablets daily as needed For anxiety   Yes Historical Provider, MD  HYDROcodone-acetaminophen (NORCO) 7.5-325 MG per tablet Take 1 tablet by mouth 4 (four) times daily. 11/05/14  Yes Historical Provider, MD  ondansetron (ZOFRAN ODT) 4 MG disintegrating tablet 4mg  ODT q4 hours prn nausea/vomit Patient not taking: Reported on 11/11/2014 07/10/14   Blane Ohara, MD   BP 142/66 mmHg  Pulse 72  Temp(Src) 97.9 F (36.6 C) (Oral)  Resp 15  Ht 5\' 6"  (1.676 m)  Wt 150 lb (68.04 kg)  BMI 24.22 kg/m2  SpO2 99% Physical Exam  Constitutional: She is oriented to person, place, and time. She appears well-developed and well-nourished. No distress.  HENT:  Head: Normocephalic and atraumatic.  Mouth/Throat: Oropharynx is clear and moist.  Eyes: Conjunctivae and EOM are normal. Pupils are equal, round, and reactive to light.  Neck: Normal range of motion.  Cardiovascular: Normal rate, regular rhythm and normal heart sounds.   No murmur  heard. Pulmonary/Chest: Effort normal and breath sounds normal. No respiratory distress.  Abdominal: Soft. Bowel sounds are normal. There is no tenderness.  Musculoskeletal: Normal range of motion.  Neurological: She is alert and oriented to person, place, and time. No cranial nerve deficit. She exhibits normal muscle tone. Coordination normal.  Skin: Skin is warm. No rash noted.  Nursing note and vitals reviewed.   ED Course  Procedures (including critical care time) Labs Review Labs Reviewed  COMPREHENSIVE METABOLIC PANEL - Abnormal; Notable for the following:    Potassium 2.7 (*)    Glucose, Bld 105 (*)    Calcium 8.5 (*)    All other components within normal limits  CBC WITH DIFFERENTIAL/PLATELET - Abnormal; Notable for the following:    Neutrophils Relative % 83 (*)    Neutro Abs 8.2 (*)    Lymphocytes Relative 8 (*)    All other components within normal limits  URINALYSIS, ROUTINE W REFLEX MICROSCOPIC (NOT AT [facility]) - Abnormal; Notable for the following:    Hgb urine dipstick SMALL (*)    All other components within normal limits  URINE MICROSCOPIC-ADD ON  CK   Results for orders placed or performed during the hospital encounter of 11/11/14  Comprehensive metabolic panel  Result Value Ref Range   Sodium 143 135 - 145 mmol/L   Potassium 2.7 (LL) 3.5 - 5.1 mmol/L   Chloride 109 101 - 111 mmol/L   CO2 25 22 - 32 mmol/L   Glucose, Bld 105 (H) 65 - 99 mg/dL   BUN 12 6 - 20 mg/dL   Creatinine, Ser 1.61 0.44 - 1.00 mg/dL   Calcium 8.5 (L) 8.9 - 10.3 mg/dL   Total Protein 6.8 6.5 - 8.1 g/dL   Albumin 4.1 3.5 - 5.0 g/dL   AST 32 15 - 41 U/L   ALT 15 14 - 54 U/L   Alkaline Phosphatase 69 38 - 126 U/L   Total Bilirubin 0.9 0.3 - 1.2 mg/dL   GFR calc non Af Amer >60 >60 mL/min   GFR calc Af Amer >60 >60 mL/min   Anion gap 9 5 - 15  CBC with Differential/Platelet  Result Value Ref Range   WBC 10.0 4.0 - 10.5 K/uL   RBC 4.88 3.87 - 5.11 MIL/uL   Hemoglobin 14.0 12.0 - 15.0  g/dL   HCT 09.6 04.5 - 40.9 %   MCV 89.5 78.0 - 100.0 fL   MCH 28.7 26.0 - 34.0 pg   MCHC 32.0 30.0 - 36.0 g/dL   RDW 81.1 91.4 - 78.2 %   Platelets 192 150 - 400 K/uL   Neutrophils Relative % 83 (H) 43 - 77 %   Neutro Abs 8.2 (H) 1.7 - 7.7 K/uL   Lymphocytes Relative 8 (L) 12 - 46 %   Lymphs Abs 0.8 0.7 - 4.0 K/uL   Monocytes Relative 9 3 - 12 %   Monocytes Absolute 0.9 0.1 - 1.0 K/uL   Eosinophils Relative  0 0 - 5 %   Eosinophils Absolute 0.0 0.0 - 0.7 K/uL   Basophils Relative 0 0 - 1 %   Basophils Absolute 0.0 0.0 - 0.1 K/uL  Urinalysis, Routine w reflex microscopic (not [facility])  Result Value Ref Range   Color, Urine YELLOW YELLOW   APPearance CLEAR CLEAR   Specific Gravity, Urine 1.015 1.005 - 1.030   pH 5.5 5.0 - 8.0   Glucose, UA NEGATIVE NEGATIVE mg/dL   Hgb urine dipstick SMALL (A) NEGATIVE   Bilirubin Urine NEGATIVE NEGATIVE   Ketones, ur NEGATIVE NEGATIVE mg/dL   Protein, ur NEGATIVE NEGATIVE mg/dL   Urobilinogen, UA 0.2 0.0 - 1.0 mg/dL   Nitrite NEGATIVE NEGATIVE   Leukocytes, UA NEGATIVE NEGATIVE  Urine microscopic-add on  Result Value Ref Range   RBC / HPF 0-2 <3 RBC/hpf     Imaging Review Dg Chest 1 View  11/11/2014   CLINICAL DATA:  Neck pain.  Patient found down.  EXAM: CHEST  2 VIEW  COMPARISON:  05/26/2014  FINDINGS: Atherosclerotic calcification and tortuosity of the aortic arch. Bony demineralization. Chronic low grade upper lobe interstitial accentuation, blind laterally symmetric. Borderline cardiomegaly, cardiothoracic index 57% on [facility] radiography.  Stable mid thoracic compression fracture compared to prior.  IMPRESSION: 1. No acute findings. 2. Chronic mid thoracic compression fracture. 3. Aortic atherosclerotic calcification. 4. Chronic faint interstitial accentuation in the lung apices.   Electronically Signed   By: Gaylyn RongWalter  Liebkemann M.D.   On: 11/11/2014 10:38   Dg Lumbar Spine Complete  11/11/2014   CLINICAL DATA:  Patient found down on floor.   Bilateral leg pain.  EXAM: LUMBAR SPINE - COMPLETE 4+ VIEW  COMPARISON:  07/10/2014  FINDINGS: Mild dextroconvex lumbar scoliosis. Bony demineralization. Degenerative lower lumbar facet arthropathy.  Reduced sensitivity and specificity of the lateral projection due to oblique rotation. I suspect a superior endplate compression at L1 of, not readily apparent on the prior CT scan from 07/10/2014.  Right anterior lower rib deformities are stable from 07/10/2014, compatible with prior rib fractures.  IMPRESSION: 1. Suspected mild superior endplate compression fracture at L1, not readily apparent on the prior CT scan from 07/10/2014. The degree of oblique rotation on the lateral projection reduces positive predictive value. 2. Old right anterior rib fractures. 3. Bony demineralization.   Electronically Signed   By: Gaylyn RongWalter  Liebkemann M.D.   On: 11/11/2014 10:42   Ct Head Wo Contrast  11/11/2014   CLINICAL DATA:  Found on floor.  Neck pain.  EXAM: CT HEAD WITHOUT CONTRAST  CT CERVICAL SPINE WITHOUT CONTRAST  TECHNIQUE: Multidetector CT imaging of the head and cervical spine was performed following the standard protocol without intravenous contrast. Multiplanar CT image reconstructions of the cervical spine were also generated.  COMPARISON:  05/26/2014  FINDINGS: CT HEAD FINDINGS  Stable cerebral atrophy. There may be a small infarct in the right white matter tract that appears to be chronic. Stable low-density in the periventricular white matter. Mild mucosal thickening in ethmoid air cells. Otherwise, the paranasal sinuses are clear. No acute bone abnormality. No evidence for acute hemorrhage, mass lesion, midline shift, hydrocephalus or large infarct.  CT CERVICAL SPINE FINDINGS  Mandibular condyles are located. Scarring at the lung apices. No soft tissue swelling in the neck. There is marked lordosis in the cervical spine. Negative for an acute fracture or dislocation. There is mild facet arthropathy.  IMPRESSION:  No acute intracranial abnormality.  Stable cerebral atrophy.  Stable white matter changes.  No acute bone abnormality in the cervical spine.   Electronically Signed   By: Richarda Overlie M.D.   On: 11/11/2014 10:55   Ct Cervical Spine Wo Contrast  11/11/2014   CLINICAL DATA:  Found on floor.  Neck pain.  EXAM: CT HEAD WITHOUT CONTRAST  CT CERVICAL SPINE WITHOUT CONTRAST  TECHNIQUE: Multidetector CT imaging of the head and cervical spine was performed following the standard protocol without intravenous contrast. Multiplanar CT image reconstructions of the cervical spine were also generated.  COMPARISON:  05/26/2014  FINDINGS: CT HEAD FINDINGS  Stable cerebral atrophy. There may be a small infarct in the right white matter tract that appears to be chronic. Stable low-density in the periventricular white matter. Mild mucosal thickening in ethmoid air cells. Otherwise, the paranasal sinuses are clear. No acute bone abnormality. No evidence for acute hemorrhage, mass lesion, midline shift, hydrocephalus or large infarct.  CT CERVICAL SPINE FINDINGS  Mandibular condyles are located. Scarring at the lung apices. No soft tissue swelling in the neck. There is marked lordosis in the cervical spine. Negative for an acute fracture or dislocation. There is mild facet arthropathy.  IMPRESSION: No acute intracranial abnormality.  Stable cerebral atrophy.  Stable white matter changes.  No acute bone abnormality in the cervical spine.   Electronically Signed   By: Richarda Overlie M.D.   On: 11/11/2014 10:55   Ct Lumbar Spine Wo Contrast  11/11/2014   CLINICAL DATA:  Conventional radiographs raise the possibility of an L1 compression fracture. Fall.  EXAM: CT LUMBAR SPINE WITHOUT CONTRAST  TECHNIQUE: Multidetector CT imaging of the lumbar spine was performed without intravenous contrast administration. Multiplanar CT image reconstructions were also generated.  COMPARISON:  11/11/2014 radiograph  FINDINGS: The lowest lumbar type  non-rib-bearing vertebra is labeled as L5.  Bony demineralization is present.  3 mm degenerative retrolisthesis at T12 all 1. 3 mm degenerative anterolisthesis at L2-3.  No acute superior endplate compression fracture at L1 is confirmed. The appearance on radiography was likely due to obliquity and spurring. No fracture elsewhere in the lumbar spine is identified.  Aortoiliac atherosclerotic vascular disease. Dextroconvex rotary scoliosis. I do not see a definite sacral fracture.  Additional findings at individual levels are as follows:  T12-L1: Diffuse disc bulge.  L1-2: Mild left foraminal stenosis due to left eccentric disc bulge and facet arthropathy.  L2-3: Mild left and borderline right foraminal stenosis and mild central narrowing of the thecal sac due to disc bulge, intervertebral spurring, and left foraminal and lateral extraforaminal disc protrusion.  L3-4: Moderate central narrowing of the thecal sac and mild bilateral foraminal stenosis due to disc bulge, central disc protrusion, and facet arthropathy.  L4-5:  No impingement.  Disc bulge noted.  L5-S1:  No impingement.  Disc bulge noted.  IMPRESSION: 1. There is no superior endplate compression fracture at L1. The appearance on radiography was spurious and due to spurring and rotation. 2. Lumbar spondylosis and degenerative disc disease, causing moderate impingement at L3-4 and mild impingement at L1-2 and L2-3, as detailed above. 3. Bony demineralization. 4. Dextroconvex rotary lumbar scoliosis. 5.  Aortoiliac atherosclerotic vascular disease.   Electronically Signed   By: Gaylyn Rong M.D.   On: 11/11/2014 11:33   Dg Hips Bilat With Pelvis Min 5 Views  11/11/2014   CLINICAL DATA:  Pain following fall  EXAM: DG HIP (WITH OR WITHOUT PELVIS) 5+V BILAT  COMPARISON:  June 18, 2014  FINDINGS: Frontal pelvis as well as frontal and lateral hip  views bilaterally obtained. The patient has had a previous fracture through the immediate sub  trochanteric region of the proximal right femur. There is screw and plate fixation in this area. There is evidence of healing with bony remodeling in this area. There were prior a avulsions of the greater and lesser trochanter interval callus in these areas.  There is no acute fracture or dislocation. Joint spaces appear intact. Bones are osteoporotic.  IMPRESSION: Old fracture of the proximal right femur with remodeling and healing. No acute fractures or dislocations. Joint spaces appear intact. Bones are osteoporotic.   Electronically Signed   By: Bretta Bang III M.D.   On: 11/11/2014 10:43     EKG Interpretation   Date/Time:  Wednesday November 11 2014 09:40:01 EDT Ventricular Rate:  72 PR Interval:  152 QRS Duration: 89 QT Interval:  481 QTC Calculation: 526 R Axis:   49 Text Interpretation:  Sinus rhythm Abnormal R-wave progression, early  transition LVH with secondary repolarization abnormality Prolonged QT  interval No significant change since last tracing Confirmed by Claudia Alvizo   MD, Burma Ketcher [phone]) on 11/11/2014 9:48:06 AM       CRITICAL CARE Performed by: Vanetta Mulders Total critical care time: 30 Critical care time was exclusive of separately billable procedures and treating other patients. Critical care was necessary to treat or prevent imminent or life-threatening deterioration. Critical care was time spent personally by me on the following activities: development of treatment plan with patient and/or surrogate as well as nursing, discussions with consultants, evaluation of patient's response to treatment, examination of patient, obtaining history from patient or surrogate, ordering and performing treatments and interventions, ordering and review of laboratory studies, ordering and review of radiographic studies, pulse oximetry and re-evaluation of patient's condition.   MDM   Final diagnoses:  Fall  Hypokalemia    Patient status post fall sometime during the night  fell backwards was on the floor all night. Discovered this morning brought in by EMS. Patient with complaint of neck pain low back pain hip pain and cramps in her lower legs. Patient has had hip surgery in the past. Patient alert and responsive no acute focal findings.   Workup here without any significant abnormalities. There was some question of a lumbar fracture but CT of the lumbar area ruled that out. Head CT negative neck CT negative chest x-ray negative labs normal with the exception of hypokalemia. Potassium 2.7. Patient given 2 runs of IV potassium and also given 40 mEq of potassium by mouth. Patient's urinalysis shows small amount of hemoglobin CPK is pending. But no evidence of any gross hemoglobinuria. Patient will be admitted for the hypokalemia. Temporary admit orders done.   Vanetta Mulders, MD 11/11/14 1319  Vanetta Mulders, MD 11/11/14 1321

## 2014-11-11 NOTE — ED Notes (Addendum)
MD Zackowski at bedside. 

## 2014-11-11 NOTE — ED Notes (Signed)
Per EMS, pt found in the floor upon arrival. Pt reports neck pain. Unknown loc. c-collar noted at time of arrival. Pt alert and talking.

## 2014-11-11 NOTE — ED Notes (Addendum)
Pt c/o of pain in arms and head. MD Zackowski made aware.

## 2014-11-12 LAB — CBC
HEMATOCRIT: 35.8 % — AB (ref 36.0–46.0)
Hemoglobin: 11.7 g/dL — ABNORMAL LOW (ref 12.0–15.0)
MCH: 29.5 pg (ref 26.0–34.0)
MCHC: 32.7 g/dL (ref 30.0–36.0)
MCV: 90.2 fL (ref 78.0–100.0)
PLATELETS: 158 10*3/uL (ref 150–400)
RBC: 3.97 MIL/uL (ref 3.87–5.11)
RDW: 15.7 % — ABNORMAL HIGH (ref 11.5–15.5)
WBC: 5.7 10*3/uL (ref 4.0–10.5)

## 2014-11-12 LAB — COMPREHENSIVE METABOLIC PANEL
ALT: 14 U/L (ref 14–54)
ANION GAP: 6 (ref 5–15)
AST: 24 U/L (ref 15–41)
Albumin: 3.1 g/dL — ABNORMAL LOW (ref 3.5–5.0)
Alkaline Phosphatase: 49 U/L (ref 38–126)
BUN: 12 mg/dL (ref 6–20)
CHLORIDE: 117 mmol/L — AB (ref 101–111)
CO2: 21 mmol/L — ABNORMAL LOW (ref 22–32)
Calcium: 7.5 mg/dL — ABNORMAL LOW (ref 8.9–10.3)
Creatinine, Ser: 0.6 mg/dL (ref 0.44–1.00)
Glucose, Bld: 91 mg/dL (ref 65–99)
POTASSIUM: 3.5 mmol/L (ref 3.5–5.1)
Sodium: 144 mmol/L (ref 135–145)
Total Bilirubin: 1 mg/dL (ref 0.3–1.2)
Total Protein: 5.4 g/dL — ABNORMAL LOW (ref 6.5–8.1)

## 2014-11-12 LAB — CK: Total CK: 410 U/L — ABNORMAL HIGH (ref 38–234)

## 2014-11-12 MED ORDER — DIPHENHYDRAMINE HCL 25 MG PO CAPS
25.0000 mg | ORAL_CAPSULE | ORAL | Status: DC | PRN
  Administered 2014-11-12: 25 mg via ORAL
  Filled 2014-11-12: qty 1

## 2014-11-12 NOTE — Evaluation (Signed)
Physical Therapy Evaluation Patient Details Name: Alexandra Riley MRN: [id number] DOB: 07/18/1921 Today's Date: 11/12/2014   History of Present Illness  HPI: Alexandra Riley is a 81 y.o. female has a past medical history significant for HLD, HTN, osteoporosis, is being brought to the ED by her family as she was found down in the bedroom this morning. Patient tells me that she fell on her back last night, she was unable to get up and spend the night on the floor. She denies any syncopal episode, and she remembers how she fell. She denies any recent fever or chills. Currently she is endorsing back pain as well as lateral hip pain. She has a history of recurrent falls, and had a hip fracture in February of this year which was repaired. She lives by herself, and family is concerned about her ability to continue to live independently. She denies any chest pain, she denies any shortness of breath, she denies any cough or chest congestion, she denies any dysuria. She denies any lightheadedness or dizziness. She endorses a poor appetite, she endorses lack of appetite and tells me she hasn't been able to drink anything overnight. In the emergency room, her blood work reveals significant hypokalemia with a potassium of 2.7. She underwent a chest x-ray which was without acute findings, as well as CT of the head, C-spine, and L-spine, Without acute fractures.CK is pending. [facility] was asked for admission for recurrent fall, hypokalemia.  Clinical Impression   Pt was seen for evaluation.  She was very pleasant and cooperative, had not been out of bed since admission.  She lives alone with intermittent assist of her niece.  She normally ambulates with a walker.  Pt's strength is WNL but her standing dynamic balance is poor without assistive device.  She tends to fall backward.  She was able to ambulate with a walker 225' with good stability.  I am concerned about the pt having enough family support when she returns home.   Her niece states that she may not be able to assist as much in the future due to other family illness.  Niece also reports that pt is intermittently confused at times at home.  Pt is strongly stating that she will return home at d/c.  She will not consider ACLF (which would be quite appropriate).  I am recommending HHPT and max family support at d/c.  I have asked nursing staff to ambulate her in the hallways to help improve standing stability.    Follow Up Recommendations HHPT- (max family support as able)    Equipment Recommendations  None recommended by PT    Recommendations for Other Services   none    Precautions / Restrictions Precautions Precautions: Fall      Mobility  Bed Mobility Overal bed mobility: Needs Assistance Bed Mobility: Supine to Sit     Supine to sit: Min assist     General bed mobility comments: bed flat  Transfers Overall transfer level: Needs assistance Equipment used: Rolling walker (2 wheeled) Transfers: Sit to/from Stand Sit to Stand: Min assist         General transfer comment: pt tended to fall backward initially upon standing...needed some assist to achieve balance  Ambulation/Gait Ambulation/Gait assistance: Supervision Ambulation Distance (Feet): 225 Feet Assistive device: Rolling walker (2 wheeled) Gait Pattern/deviations: WFL(Within Functional Limits)   Gait velocity interpretation: at or above normal speed for age/gender    Stairs  Wheelchair Mobility    Modified Rankin (Stroke Patients Only)       Balance Overall balance assessment: Needs assistance Sitting-balance support: No upper extremity supported;Feet supported Sitting balance-Leahy Scale: Normal     Standing balance support: No upper extremity supported Standing balance-Leahy Scale: Poor Standing balance comment: needs two hand support                             Pertinent Vitals/Pain Pain Assessment: 0-10 Pain Score: 7  Pain  Location: low back and head pain Pain Descriptors / Indicators: Aching Pain Intervention(s): Patient requesting pain meds-RN notified;Limited activity within patient's tolerance    Home Living Family/patient expects to be discharged to:: Private residence Living Arrangements: Alone Available Help at Discharge: Family;Available PRN/intermittently (niece states that she is not sure how much assist she will be able to provide to pt.)           Home Equipment: Walker - 2 wheels;Cane - single point      Prior Function Level of Independence: Independent with assistive device(s)         Comments: uses a walker     Hand Dominance   Dominant Hand: Right    Extremity/Trunk Assessment               Lower Extremity Assessment: Overall WFL for tasks assessed      Cervical / Trunk Assessment: Kyphotic  Communication   Communication: HOH  Cognition Arousal/Alertness: Awake/alert Behavior During Therapy: WFL for tasks assessed/performed Overall Cognitive Status: Within Functional Limits for tasks assessed                      General Comments      Exercises General Exercises - Lower Extremity Ankle Circles/Pumps: AROM;Both;10 reps;Supine Short Arc Quad: AROM;Both;10 reps;Supine Heel Slides: AROM;Both;10 reps;Supine Hip ABduction/ADduction: AROM;Both;10 reps;Supine      Assessment/Plan    PT Assessment Patient needs continued PT services  PT Diagnosis  (decreased balance)   PT Problem List Decreased balance  PT Treatment Interventions Balance training   PT Goals (Current goals can be found in the Care Plan section) Acute Rehab PT Goals Patient Stated Goal: return home PT Goal Formulation: With patient Time For Goal Achievement: 11/26/14 Potential to Achieve Goals: Good Additional Goals Additional Goal #1: achieve standing balance with no assistive device to fair    Frequency Min 3X/week   Barriers to discharge Decreased caregiver support pt  should have max support at home, at least initially    Co-evaluation               End of Session Equipment Utilized During Treatment: Gait belt Activity Tolerance: Patient tolerated treatment well Patient left: in chair;with call bell/phone within reach;with chair alarm set Nurse Communication: Mobility status         Time: [id number] PT Time Calculation (min) (ACUTE ONLY): 59 min   Charges:   PT Evaluation $Initial PT Evaluation Tier I: 1 Procedure PT Treatments $Therapeutic Activity: 8-22 mins   PT G CodesKonrad Penta:        Etienne Millward L  PT 11/12/2014, 11:43 AM [phone]

## 2014-11-12 NOTE — Progress Notes (Signed)
Subjective: The patient is alert and oriented. She is complaining of back pain. She apparently fell backwards on the floor at night was complaining of back pain and hip pain who was brought to the emergency room where she was evaluated. CT of the lumbar area head and neck were negative. Patient did have low serum potassium at 2.7 was given 2 runs of IV potassium and started on potassium by mouth  Objective: Vital signs in last 24 hours: Temp:  [97.8 F (36.6 C)-98.2 F (36.8 C)] 98.2 F (36.8 C) (07/13 2215) Pulse Rate:  [66-82] 82 (07/13 2215) Resp:  [15-22] 16 (07/13 2215) BP: (122-162)/(55-94) 142/71 mmHg (07/13 2215) SpO2:  [98 %-100 %] 98 % (07/13 2215) Weight:  [68.04 kg (150 lb)] 68.04 kg (150 lb) (07/13 0904) Weight change:  Last BM Date: 11/11/14  Intake/Output from previous day: 07/13 0701 - 07/14 0700 In: 861.3 [P.O.:480; I.V.:381.3] Out: 200 [Urine:200] Intake/Output this shift:    Physical Exam: Gen. appearance the patient is alert and oriented  HEENT negative  Neck supple no JVD or thyroid abnormalities  Lungs clear to P&A  Heart regular rhythm no murmurs  Skin warm and dry  Back paraspinous muscle tenderness upper and lower back   Recent Labs  11/11/14 0947  WBC 10.0  HGB 14.0  HCT 43.7  PLT 192   BMET  Recent Labs  11/11/14 0947  NA 143  K 2.7*  CL 109  CO2 25  GLUCOSE 105*  BUN 12  CREATININE 0.63  CALCIUM 8.5*    Studies/Results: Dg Chest 1 View  11/11/2014   CLINICAL DATA:  Neck pain.  Patient found down.  EXAM: CHEST  2 VIEW  COMPARISON:  05/26/2014  FINDINGS: Atherosclerotic calcification and tortuosity of the aortic arch. Bony demineralization. Chronic low grade upper lobe interstitial accentuation, blind laterally symmetric. Borderline cardiomegaly, cardiothoracic index 57% on AP radiography.  Stable mid thoracic compression fracture compared to prior.  IMPRESSION: 1. No acute findings. 2. Chronic mid thoracic compression  fracture. 3. Aortic atherosclerotic calcification. 4. Chronic faint interstitial accentuation in the lung apices.   Electronically Signed   By: Van Clines M.D.   On: 11/11/2014 10:38   Dg Lumbar Spine Complete  11/11/2014   CLINICAL DATA:  Patient found down on floor.  Bilateral leg pain.  EXAM: LUMBAR SPINE - COMPLETE 4+ VIEW  COMPARISON:  07/10/2014  FINDINGS: Mild dextroconvex lumbar scoliosis. Bony demineralization. Degenerative lower lumbar facet arthropathy.  Reduced sensitivity and specificity of the lateral projection due to oblique rotation. I suspect a superior endplate compression at L1 of, not readily apparent on the prior CT scan from 07/10/2014.  Right anterior lower rib deformities are stable from 07/10/2014, compatible with prior rib fractures.  IMPRESSION: 1. Suspected mild superior endplate compression fracture at L1, not readily apparent on the prior CT scan from 07/10/2014. The degree of oblique rotation on the lateral projection reduces positive predictive value. 2. Old right anterior rib fractures. 3. Bony demineralization.   Electronically Signed   By: Van Clines M.D.   On: 11/11/2014 10:42   Ct Head Wo Contrast  11/11/2014   CLINICAL DATA:  Found on floor.  Neck pain.  EXAM: CT HEAD WITHOUT CONTRAST  CT CERVICAL SPINE WITHOUT CONTRAST  TECHNIQUE: Multidetector CT imaging of the head and cervical spine was performed following the standard protocol without intravenous contrast. Multiplanar CT image reconstructions of the cervical spine were also generated.  COMPARISON:  05/26/2014  FINDINGS: CT HEAD FINDINGS  Stable cerebral atrophy. There may be a small infarct in the right white matter tract that appears to be chronic. Stable low-density in the periventricular white matter. Mild mucosal thickening in ethmoid air cells. Otherwise, the paranasal sinuses are clear. No acute bone abnormality. No evidence for acute hemorrhage, mass lesion, midline shift, hydrocephalus or  large infarct.  CT CERVICAL SPINE FINDINGS  Mandibular condyles are located. Scarring at the lung apices. No soft tissue swelling in the neck. There is marked lordosis in the cervical spine. Negative for an acute fracture or dislocation. There is mild facet arthropathy.  IMPRESSION: No acute intracranial abnormality.  Stable cerebral atrophy.  Stable white matter changes.  No acute bone abnormality in the cervical spine.   Electronically Signed   By: Markus Daft M.D.   On: 11/11/2014 10:55   Ct Cervical Spine Wo Contrast  11/11/2014   CLINICAL DATA:  Found on floor.  Neck pain.  EXAM: CT HEAD WITHOUT CONTRAST  CT CERVICAL SPINE WITHOUT CONTRAST  TECHNIQUE: Multidetector CT imaging of the head and cervical spine was performed following the standard protocol without intravenous contrast. Multiplanar CT image reconstructions of the cervical spine were also generated.  COMPARISON:  05/26/2014  FINDINGS: CT HEAD FINDINGS  Stable cerebral atrophy. There may be a small infarct in the right white matter tract that appears to be chronic. Stable low-density in the periventricular white matter. Mild mucosal thickening in ethmoid air cells. Otherwise, the paranasal sinuses are clear. No acute bone abnormality. No evidence for acute hemorrhage, mass lesion, midline shift, hydrocephalus or large infarct.  CT CERVICAL SPINE FINDINGS  Mandibular condyles are located. Scarring at the lung apices. No soft tissue swelling in the neck. There is marked lordosis in the cervical spine. Negative for an acute fracture or dislocation. There is mild facet arthropathy.  IMPRESSION: No acute intracranial abnormality.  Stable cerebral atrophy.  Stable white matter changes.  No acute bone abnormality in the cervical spine.   Electronically Signed   By: Markus Daft M.D.   On: 11/11/2014 10:55   Ct Lumbar Spine Wo Contrast  11/11/2014   CLINICAL DATA:  Conventional radiographs raise the possibility of an L1 compression fracture. Fall.  EXAM: CT  LUMBAR SPINE WITHOUT CONTRAST  TECHNIQUE: Multidetector CT imaging of the lumbar spine was performed without intravenous contrast administration. Multiplanar CT image reconstructions were also generated.  COMPARISON:  11/11/2014 radiograph  FINDINGS: The lowest lumbar type non-rib-bearing vertebra is labeled as L5.  Bony demineralization is present.  3 mm degenerative retrolisthesis at T12 all 1. 3 mm degenerative anterolisthesis at L2-3.  No acute superior endplate compression fracture at L1 is confirmed. The appearance on radiography was likely due to obliquity and spurring. No fracture elsewhere in the lumbar spine is identified.  Aortoiliac atherosclerotic vascular disease. Dextroconvex rotary scoliosis. I do not see a definite sacral fracture.  Additional findings at individual levels are as follows:  T12-L1: Diffuse disc bulge.  L1-2: Mild left foraminal stenosis due to left eccentric disc bulge and facet arthropathy.  L2-3: Mild left and borderline right foraminal stenosis and mild central narrowing of the thecal sac due to disc bulge, intervertebral spurring, and left foraminal and lateral extraforaminal disc protrusion.  L3-4: Moderate central narrowing of the thecal sac and mild bilateral foraminal stenosis due to disc bulge, central disc protrusion, and facet arthropathy.  L4-5:  No impingement.  Disc bulge noted.  L5-S1:  No impingement.  Disc bulge noted.  IMPRESSION: 1. There is no superior  endplate compression fracture at L1. The appearance on radiography was spurious and due to spurring and rotation. 2. Lumbar spondylosis and degenerative disc disease, causing moderate impingement at L3-4 and mild impingement at L1-2 and L2-3, as detailed above. 3. Bony demineralization. 4. Dextroconvex rotary lumbar scoliosis. 5.  Aortoiliac atherosclerotic vascular disease.   Electronically Signed   By: Van Clines M.D.   On: 11/11/2014 11:33   Dg Hips Bilat With Pelvis Min 5 Views  11/11/2014   CLINICAL  DATA:  Pain following fall  EXAM: DG HIP (WITH OR WITHOUT PELVIS) 5+V BILAT  COMPARISON:  June 18, 2014  FINDINGS: Frontal pelvis as well as frontal and lateral hip views bilaterally obtained. The patient has had a previous fracture through the immediate sub trochanteric region of the proximal right femur. There is screw and plate fixation in this area. There is evidence of healing with bony remodeling in this area. There were prior a avulsions of the greater and lesser trochanter interval callus in these areas.  There is no acute fracture or dislocation. Joint spaces appear intact. Bones are osteoporotic.  IMPRESSION: Old fracture of the proximal right femur with remodeling and healing. No acute fractures or dislocations. Joint spaces appear intact. Bones are osteoporotic.   Electronically Signed   By: Lowella Grip III M.D.   On: 11/11/2014 10:43    Medications:  . sodium chloride   Intravenous STAT  . aspirin  81 mg Oral Daily  . atorvastatin  10 mg Oral QPM  . carisoprodol  350 mg Oral QHS  . clonazePAM  0.5 mg Oral QHS  . enoxaparin (LOVENOX) injection  40 mg Subcutaneous Q24H  . sodium chloride  3 mL Intravenous Q12H    . sodium chloride 100 mL/hr at 11/11/14 2143     Assessment/Plan: 1. Patient had a fall at home and has minimal lower back pain no fractures. Patient has had hip surgery in the past  Patient noted to have low serum potassium-was given runs of potassium repeat be met pending plan to continue to monitor potassium and replace if needed   LOS: 1 day   Grizel Vesely G 11/12/2014, 6:22 AM

## 2014-11-12 NOTE — Care Management Note (Signed)
Case Management Note  Patient Details  Name: Alexandra OvermanRuth W Riley MRN: [id number] Date of Birth: [birth year]  Subjective/Objective:                  Pt admitted from home with hypokalemia and rhabdomylosis. Pt lives alone and will return home at discharge. Pt has a niece that is with pt daily and pt also has a daughter and 2 sons who are very active in the care of the pt. Pt has a walker for home use. Pt is moderately independent with ADl's.  Action/Plan: PT is recommending HH PT and RN. Pt would like [facility] as she has used them in the past. [facility]. No DME needs noted at this time.  Expected Discharge Date:                  Expected Discharge Plan:  Home w Home Health Services  In-House Referral:  NA  Discharge planning Services  CM Consult  Post Acute Care Choice:  Home Health Choice offered to:  Patient  DME Arranged:    DME Agency:     HH Arranged:  RN, PT HH Agency:  [facility]  Status of Service:  Completed, signed off  Medicare Important Message Given:    Date Medicare IM Given:    Medicare IM give by:    Date Additional Medicare IM Given:    Additional Medicare Important Message give by:     If discussed at Long Length of Stay Meetings, dates discussed:    Additional Comments:  Cheryl FlashBlackwell, Kati Riggenbach Crowder, RN 11/12/2014, 1:35 PM

## 2014-11-13 NOTE — Discharge Summary (Signed)
Physician Discharge Summary  Alexandra Riley:[id number] DOB: Jan 19, 1922 DOA: 11/11/2014  PCP: [name], MD  Admit date: 11/11/2014 Discharge date: 11/13/2014     Discharge Diagnoses:  1. Fall at home with injury to back 2. Low serum potassium 3. Rhabdomyolysis 4. Prior right hip fracture 5. .  Discharge Condition: Stable Disposition: Home Diet recommendation: Regular  Filed Weights   11/11/14 0904  Weight: 68.04 kg (150 lb)    History of present illness:  The patient was brought to the emergency room after having suffered a fall at home and she had prior hip fracture in February of this year blood work on admission section of significant hypokalemia with potassium 2.7 CT of head and CT of spine were negative for acute fracture. IV fluids were started and the patient was admitted  Hospital Course:  The patient was moved to MedSurg bed. Positive physical findings on admission alert patient with some scattered for spinous muscle tenderness over the low back. Prior x-rays were negative for fracture patient was continued on medications listed below. She was assisted with ambulation and was felt she could be discharged after 2 days hospitalization   Discharge Instructions The patient is to continue medications listed below. She was asked to make appointment with attending physician within the next week. Family to assist patient walking. She may need physical therapy    Medication List    TAKE these medications        aspirin 81 MG chewable tablet  Chew 81 mg by mouth daily.     atorvastatin 10 MG tablet  Commonly known as:  LIPITOR  Take 10 mg by mouth every evening.     carisoprodol 350 MG tablet  Commonly known as:  SOMA  Take 350 mg by mouth at bedtime. *May take one to two additional tablets only if needed For sleep and muscle relaxant     clonazePAM 0.5 MG tablet  Commonly known as:  KLONOPIN  Take 0.5 mg by mouth at bedtime. *May take one to two tablets  daily as needed For anxiety     HYDROcodone-acetaminophen 7.5-325 MG per tablet  Commonly known as:  NORCO  Take 1 tablet by mouth 4 (four) times daily.     ondansetron 4 MG disintegrating tablet  Commonly known as:  ZOFRAN ODT   ODT q4 hours prn nausea/vomit       No Known Allergies  The results of significant diagnostics from this hospitalization (including imaging, microbiology, ancillary and laboratory) are listed below for reference.    Significant Diagnostic Studies: Dg Chest 1 View  11/11/2014   CLINICAL DATA:  Neck pain.  Patient found down.  EXAM: CHEST  2 VIEW  COMPARISON:  05/26/2014  FINDINGS: Atherosclerotic calcification and tortuosity of the aortic arch. Bony demineralization. Chronic low grade upper lobe interstitial accentuation, blind laterally symmetric. Borderline cardiomegaly, cardiothoracic index 57% on AP radiography.  Stable mid thoracic compression fracture compared to prior.  IMPRESSION: 1. No acute findings. 2. Chronic mid thoracic compression fracture. 3. Aortic atherosclerotic calcification. 4. Chronic faint interstitial accentuation in the lung apices.   Electronically Signed   By: Gaylyn Rong M.D.   On: 11/11/2014 10:38   Dg Lumbar Spine Complete  11/11/2014   CLINICAL DATA:  Patient found down on floor.  Bilateral leg pain.  EXAM: LUMBAR SPINE - COMPLETE 4+ VIEW  COMPARISON:  07/10/2014  FINDINGS: Mild dextroconvex lumbar scoliosis. Bony demineralization. Degenerative lower lumbar facet arthropathy.  Reduced sensitivity and specificity of the  lateral projection due to oblique rotation. I suspect a superior endplate compression at L1 of, not readily apparent on the prior CT scan from 07/10/2014.  Right anterior lower rib deformities are stable from 07/10/2014, compatible with prior rib fractures.  IMPRESSION: 1. Suspected mild superior endplate compression fracture at L1, not readily apparent on the prior CT scan from 07/10/2014. The degree of oblique  rotation on the lateral projection reduces positive predictive value. 2. Old right anterior rib fractures. 3. Bony demineralization.   Electronically Signed   By: Gaylyn Rong M.D.   On: 11/11/2014 10:42   Ct Head Wo Contrast  11/11/2014   CLINICAL DATA:  Found on floor.  Neck pain.  EXAM: CT HEAD WITHOUT CONTRAST  CT CERVICAL SPINE WITHOUT CONTRAST  TECHNIQUE: Multidetector CT imaging of the head and cervical spine was performed following the standard protocol without intravenous contrast. Multiplanar CT image reconstructions of the cervical spine were also generated.  COMPARISON:  05/26/2014  FINDINGS: CT HEAD FINDINGS  Stable cerebral atrophy. There may be a small infarct in the right white matter tract that appears to be chronic. Stable low-density in the periventricular white matter. Mild mucosal thickening in ethmoid air cells. Otherwise, the paranasal sinuses are clear. No acute bone abnormality. No evidence for acute hemorrhage, mass lesion, midline shift, hydrocephalus or large infarct.  CT CERVICAL SPINE FINDINGS  Mandibular condyles are located. Scarring at the lung apices. No soft tissue swelling in the neck. There is marked lordosis in the cervical spine. Negative for an acute fracture or dislocation. There is mild facet arthropathy.  IMPRESSION: No acute intracranial abnormality.  Stable cerebral atrophy.  Stable white matter changes.  No acute bone abnormality in the cervical spine.   Electronically Signed   By: Richarda Overlie M.D.   On: 11/11/2014 10:55   Ct Cervical Spine Wo Contrast  11/11/2014   CLINICAL DATA:  Found on floor.  Neck pain.  EXAM: CT HEAD WITHOUT CONTRAST  CT CERVICAL SPINE WITHOUT CONTRAST  TECHNIQUE: Multidetector CT imaging of the head and cervical spine was performed following the standard protocol without intravenous contrast. Multiplanar CT image reconstructions of the cervical spine were also generated.  COMPARISON:  05/26/2014  FINDINGS: CT HEAD FINDINGS  Stable  cerebral atrophy. There may be a small infarct in the right white matter tract that appears to be chronic. Stable low-density in the periventricular white matter. Mild mucosal thickening in ethmoid air cells. Otherwise, the paranasal sinuses are clear. No acute bone abnormality. No evidence for acute hemorrhage, mass lesion, midline shift, hydrocephalus or large infarct.  CT CERVICAL SPINE FINDINGS  Mandibular condyles are located. Scarring at the lung apices. No soft tissue swelling in the neck. There is marked lordosis in the cervical spine. Negative for an acute fracture or dislocation. There is mild facet arthropathy.  IMPRESSION: No acute intracranial abnormality.  Stable cerebral atrophy.  Stable white matter changes.  No acute bone abnormality in the cervical spine.   Electronically Signed   By: Richarda Overlie M.D.   On: 11/11/2014 10:55   Ct Lumbar Spine Wo Contrast  11/11/2014   CLINICAL DATA:  Conventional radiographs raise the possibility of an L1 compression fracture. Fall.  EXAM: CT LUMBAR SPINE WITHOUT CONTRAST  TECHNIQUE: Multidetector CT imaging of the lumbar spine was performed without intravenous contrast administration. Multiplanar CT image reconstructions were also generated.  COMPARISON:  11/11/2014 radiograph  FINDINGS: The lowest lumbar type non-rib-bearing vertebra is labeled as L5.  Bony demineralization is present.  3 mm degenerative retrolisthesis at T12 all 1. 3 mm degenerative anterolisthesis at L2-3.  No acute superior endplate compression fracture at L1 is confirmed. The appearance on radiography was likely due to obliquity and spurring. No fracture elsewhere in the lumbar spine is identified.  Aortoiliac atherosclerotic vascular disease. Dextroconvex rotary scoliosis. I do not see a definite sacral fracture.  Additional findings at individual levels are as follows:  T12-L1: Diffuse disc bulge.  L1-2: Mild left foraminal stenosis due to left eccentric disc bulge and facet arthropathy.   L2-3: Mild left and borderline right foraminal stenosis and mild central narrowing of the thecal sac due to disc bulge, intervertebral spurring, and left foraminal and lateral extraforaminal disc protrusion.  L3-4: Moderate central narrowing of the thecal sac and mild bilateral foraminal stenosis due to disc bulge, central disc protrusion, and facet arthropathy.  L4-5:  No impingement.  Disc bulge noted.  L5-S1:  No impingement.  Disc bulge noted.  IMPRESSION: 1. There is no superior endplate compression fracture at L1. The appearance on radiography was spurious and due to spurring and rotation. 2. Lumbar spondylosis and degenerative disc disease, causing moderate impingement at L3-4 and mild impingement at L1-2 and L2-3, as detailed above. 3. Bony demineralization. 4. Dextroconvex rotary lumbar scoliosis. 5.  Aortoiliac atherosclerotic vascular disease.   Electronically Signed   By: Gaylyn Rong M.D.   On: 11/11/2014 11:33   Dg Hips Bilat With Pelvis Min 5 Views  11/11/2014   CLINICAL DATA:  Pain following fall  EXAM: DG HIP (WITH OR WITHOUT PELVIS) 5+V BILAT  COMPARISON:  June 18, 2014  FINDINGS: Frontal pelvis as well as frontal and lateral hip views bilaterally obtained. The patient has had a previous fracture through the immediate sub trochanteric region of the proximal right femur. There is screw and plate fixation in this area. There is evidence of healing with bony remodeling in this area. There were prior a avulsions of the greater and lesser trochanter interval callus in these areas.  There is no acute fracture or dislocation. Joint spaces appear intact. Bones are osteoporotic.  IMPRESSION: Old fracture of the proximal right femur with remodeling and healing. No acute fractures or dislocations. Joint spaces appear intact. Bones are osteoporotic.   Electronically Signed   By: Bretta Bang III M.D.   On: 11/11/2014 10:43    Microbiology: No results found for this or any previous visit  (from the past 240 hour(s)).   Labs: Basic Metabolic Panel:  Recent Labs Lab 11/11/14 0947 11/12/14 0635  NA 143 144  K 2.7* 3.5  CL 109 117*  CO2 25 21*  GLUCOSE 105* 91  BUN 12 12  CREATININE 0.63 0.60  CALCIUM 8.5* 7.5*   Liver Function Tests:  Recent Labs Lab 11/11/14 0947 11/12/14 0635  AST 32 24  ALT 15 14  ALKPHOS 69 49  BILITOT 0.9 1.0  PROT 6.8 5.4*  ALBUMIN 4.1 3.1*   No results for input(s): LIPASE, AMYLASE in the last 168 hours. No results for input(s): AMMONIA in the last 168 hours. CBC:  Recent Labs Lab 11/11/14 0947 11/12/14 0635  WBC 10.0 5.7  NEUTROABS 8.2*  --   HGB 14.0 11.7*  HCT 43.7 35.8*  MCV 89.5 90.2  PLT 192 158   Cardiac Enzymes:  Recent Labs Lab 11/11/14 0947 11/12/14 0635  CKTOTAL 727* 410*   BNP: BNP (last 3 results) No results for input(s): BNP in the last 8760 hours.  ProBNP (last 3 results) No  results for input(s): PROBNP in the last 8760 hours.  CBG: No results for input(s): GLUCAP in the last 168 hours.  Active Problems:   Hypokalemia   Recurrent falls   Rhabdomyolysis   Time coordinating discharge: 30 minutes Signed:  Butch PennyAngus Silena Wyss, MD 11/13/2014, 6:27 AM

## 2014-11-13 NOTE — Progress Notes (Signed)
Patient discharged with instructions.  Verbalized understanding via teach back.  IV was removed on both sides and the site was WNL. Patient voiced no further complaints or concerns at the time of discharge.    Patient left the floor via w/c with staff and family in stable condition.  No [prescriptions was left for the family.

## 2014-11-13 NOTE — Care Management Important Message (Signed)
Important Message  Patient Details  Name: Syliva OvermanRuth W Binsfeld MRN: [id number] Date of Birth: [birth year]   Medicare Important Message Given:  N/A - LOS <3 / Initial given by admissions    Cheryl FlashBlackwell, Jamarr Treinen Crowder, RN 11/13/2014, 9:46 AM

## 2014-11-13 NOTE — Care Management Note (Signed)
Case Management Note  Patient Details  Name: Alexandra Riley MRN: [id number] Date of Birth: 02/03/1922  Subjective/Objective:                    Action/Plan:   Expected Discharge Date:      11/13/14            Expected Discharge Plan:  Home w Home Health Services  In-House Referral:  NA  Discharge planning Services  CM Consult  Post Acute Care Choice:  Home Health Choice offered to:  Patient  DME Arranged:    DME Agency:     HH Arranged:  RN, PT HH Agency:  [facility]  Status of Service:  Completed, signed off  Medicare Important Message Given:    Date Medicare IM Given:    Medicare IM give by:    Date Additional Medicare IM Given:    Additional Medicare Important Message give by:     If discussed at Long Length of Stay Meetings, dates discussed:    Additional Comments: [facility] Alroy BailiffLinda Lothian [facility] aware and will collect pts information from the chart. HH services to start within 48 hours of discharge. No DME needs noted. Pt and pts nurse aware of discharge arrangement. Arlyss QueenBlackwell, Takyla Kuchera Kinstonrowder, RN 11/13/2014, 9:23 AM

## 2015-06-08 ENCOUNTER — Encounter: Payer: Self-pay | Admitting: Orthopaedic Surgery

## 2015-06-08 ENCOUNTER — Ambulatory Visit (INDEPENDENT_AMBULATORY_CARE_PROVIDER_SITE_OTHER): Payer: Medicare Other | Admitting: Orthopaedic Surgery

## 2015-06-08 VITALS — BP 135/75 | HR 82 | Temp 97.5°F | Wt 128.0 lb

## 2015-06-08 DIAGNOSIS — M25551 Pain in right hip: Secondary | ICD-10-CM

## 2015-06-08 MED ORDER — HYDROCODONE-ACETAMINOPHEN 5-325 MG PO TABS: 1.0000 | ORAL_TABLET | ORAL | Status: DC | PRN

## 2015-06-08 NOTE — Patient Instructions (Signed)
Use walker. Stay active.    See as needed.

## 2015-06-08 NOTE — Progress Notes (Signed)
Patient Alexandra Riley, female DOB:1921/05/27, 82 y.o. BJY:[id number]  Chief Complaint  Patient presents with  . Follow-up    Rt hip fracture "feels very well, burns sometimes"    HPI  Alexandra Riley is a 82 y.o. female is doing very well post right hip surgery.  She has no pain.  She is using her walker. She has some falls at times and that has been a long standing problem.    HPI  Review of Systems Review of Systems  Respiratory: Positive for shortness of breath.   Gastrointestinal: Positive for constipation.  Musculoskeletal: Positive for gait problem (needs walker.  History of falls.  Workup negative in past for cause of falls.).  Neurological: Positive for syncope and headaches.    Past Medical History  Diagnosis Date  . Osteoporosis   . High cholesterol   . Constipation   . Glaucoma   . Shortness of breath on exertion     Past Surgical History  Procedure Laterality Date  . Tonsillectomy    . Cesarean section    . Cataract extraction w/phaco Right 01/20/2013    Procedure: CATARACT EXTRACTION PHACO AND INTRAOCULAR LENS PLACEMENT (IOC);  Surgeon: Gemma Payor, MD;  Location: [facility];  Service: Ophthalmology;  Laterality: Right;  CDE:  18.27  . Eye surgery    . Orif hip fracture Right 05/28/2014    Procedure: OPEN REDUCTION INTERNAL FIXATION HIP;  Surgeon: Darreld Mclean, MD;  Location: [facility];  Service: Orthopedics;  Laterality: Right;    No family history on file.  Social History Social History  Substance Use Topics  . Smoking status: Never Smoker   . Smokeless tobacco: Current User    Types: Snuff  . Alcohol Use: No    No Known Allergies  Current Outpatient Prescriptions  Medication Sig Dispense Refill  . atorvastatin (LIPITOR) 10 MG tablet Take 10 mg by mouth every evening.     . carisoprodol (SOMA) 350 MG tablet Take 350 mg by mouth at bedtime. *May take one to two additional tablets only if needed For sleep and muscle relaxant    . clonazePAM (KLONOPIN)  0.5 MG tablet Take 0.5 mg by mouth at bedtime. *May take one to two tablets daily as needed For anxiety    . dicyclomine (BENTYL) 10 MG capsule Take 10 mg by mouth 4 (four) times daily -  before meals and at bedtime.    . Linaclotide (LINZESS) 145 MCG CAPS capsule Take 145 mcg by mouth daily.    . ondansetron (ZOFRAN ODT) 4 MG disintegrating tablet  ODT q4 hours prn nausea/vomit 4 tablet 0  . pantoprazole (PROTONIX) 40 MG tablet Take 40 mg by mouth daily.    Marland Kitchen HYDROcodone-acetaminophen (NORCO/VICODIN) 5-325 MG tablet Take 1 tablet by mouth every 4 (four) hours as needed for moderate pain (Must last 30 days.  Do not take and drive a car or use machinery.). 120 tablet 0   No current facility-administered medications for this visit.     Physical Exam  Blood pressure 135/75, pulse 82, temperature 97.5 F (36.4 C), weight 128 lb (58.06 kg).  Constitutional: overall normal hygiene, normal nutrition, well developed, normal grooming, normal body habitus. Assistive device:walker  Musculoskeletal: gait and station Limp none, muscle tone and strength are normal, no tremors or atrophy is present.  .  Neurological: coordination overall normal.  Deep tendon reflex/nerve stretch intact.  Sensation normal.  Cranial nerves II-XII intact.   Skin:normal overall no scars, lesions, ulcers or rash  Well healed scar from right hip surgery. es. No psoriasis.  Psychiatric: Alert and oriented x 3.  Recent memory intact, remote memory unclear.  Normal mood and affect. Well groomed.  Good eye contact.  Cardiovascular: overall no swelling, no varicosities, no edema bilaterally, normal temperatures of the legs and arms, no clubbing, cyanosis and good capillary refill.  Lymphatic: palpation is normal.  Encounter Diagnosis  Name Primary?  . Hip pain, right Yes     Extremities:She is walking well with walker.  She has no pain. Inspection normal Strength and tone normal Range of motion normal of the right  hip  Additional services performed: none  PLAN Call if any problems.  Precautions discussed.  Continue current medications.   Return to clinic PRN

## 2015-06-09 ENCOUNTER — Emergency Department (HOSPITAL_COMMUNITY)
Admission: EM | Admit: 2015-06-09 | Discharge: 2015-06-09 | Disposition: A | Discharge status: Home / Self Care | Payer: Medicare Other | Attending: Emergency Medicine | Admitting: Emergency Medicine

## 2015-06-09 ENCOUNTER — Encounter (HOSPITAL_COMMUNITY): Payer: Self-pay | Admitting: Emergency Medicine

## 2015-06-09 DIAGNOSIS — R319 Hematuria, unspecified: Secondary | ICD-10-CM | POA: Insufficient documentation

## 2015-06-09 DIAGNOSIS — R300 Dysuria: Secondary | ICD-10-CM | POA: Diagnosis not present

## 2015-06-09 DIAGNOSIS — R1030 Lower abdominal pain, unspecified: Secondary | ICD-10-CM | POA: Insufficient documentation

## 2015-06-09 NOTE — ED Notes (Signed)
Pt left after triage due to wait 

## 2015-06-09 NOTE — ED Notes (Addendum)
Per daughter, patient had blood in urine on Monday and patient complaining yesterday of "not being able to pee."  Patient states "when I went earlier, I only went a couple of drops." Patient complaining of lower abdominal pain and pain with urination.

## 2015-06-15 ENCOUNTER — Encounter (HOSPITAL_COMMUNITY): Payer: Self-pay | Admitting: Emergency Medicine

## 2015-06-15 ENCOUNTER — Emergency Department (HOSPITAL_COMMUNITY)
Admission: EM | Admit: 2015-06-15 | Discharge: 2015-06-16 | Disposition: A | Discharge status: Home / Self Care | Payer: Medicare Other | Attending: Emergency Medicine | Admitting: Emergency Medicine

## 2015-06-15 ENCOUNTER — Emergency Department (HOSPITAL_COMMUNITY): Payer: Medicare Other

## 2015-06-15 DIAGNOSIS — Z79899 Other long term (current) drug therapy: Secondary | ICD-10-CM | POA: Diagnosis not present

## 2015-06-15 DIAGNOSIS — R300 Dysuria: Secondary | ICD-10-CM | POA: Diagnosis not present

## 2015-06-15 DIAGNOSIS — Z8719 Personal history of other diseases of the digestive system: Secondary | ICD-10-CM | POA: Diagnosis not present

## 2015-06-15 DIAGNOSIS — N939 Abnormal uterine and vaginal bleeding, unspecified: Secondary | ICD-10-CM | POA: Diagnosis not present

## 2015-06-15 DIAGNOSIS — Z8669 Personal history of other diseases of the nervous system and sense organs: Secondary | ICD-10-CM | POA: Insufficient documentation

## 2015-06-15 DIAGNOSIS — R1030 Lower abdominal pain, unspecified: Secondary | ICD-10-CM

## 2015-06-15 DIAGNOSIS — E7800 Pure hypercholesterolemia, unspecified: Secondary | ICD-10-CM | POA: Insufficient documentation

## 2015-06-15 DIAGNOSIS — Z7982 Long term (current) use of aspirin: Secondary | ICD-10-CM | POA: Diagnosis not present

## 2015-06-15 DIAGNOSIS — Z8739 Personal history of other diseases of the musculoskeletal system and connective tissue: Secondary | ICD-10-CM | POA: Diagnosis not present

## 2015-06-15 LAB — CBC WITH DIFFERENTIAL/PLATELET
Basophils Absolute: 0 10*3/uL (ref 0.0–0.1)
Basophils Relative: 0 %
Eosinophils Absolute: 0.1 10*3/uL (ref 0.0–0.7)
Eosinophils Relative: 2 %
HEMATOCRIT: 34.6 % — AB (ref 36.0–46.0)
Hemoglobin: 11.3 g/dL — ABNORMAL LOW (ref 12.0–15.0)
LYMPHS ABS: 1.6 10*3/uL (ref 0.7–4.0)
Lymphocytes Relative: 24 %
MCH: 30.9 pg (ref 26.0–34.0)
MCHC: 32.7 g/dL (ref 30.0–36.0)
MCV: 94.5 fL (ref 78.0–100.0)
MONOS PCT: 11 %
Monocytes Absolute: 0.8 10*3/uL (ref 0.1–1.0)
Neutro Abs: 4.1 10*3/uL (ref 1.7–7.7)
Neutrophils Relative %: 63 %
Platelets: 173 10*3/uL (ref 150–400)
RBC: 3.66 MIL/uL — ABNORMAL LOW (ref 3.87–5.11)
RDW: 13.5 % (ref 11.5–15.5)
WBC: 6.6 10*3/uL (ref 4.0–10.5)

## 2015-06-15 LAB — URINALYSIS, ROUTINE W REFLEX MICROSCOPIC
Bilirubin Urine: NEGATIVE
GLUCOSE, UA: NEGATIVE mg/dL
LEUKOCYTES UA: NEGATIVE
Nitrite: NEGATIVE
PH: 5 (ref 5.0–8.0)
Protein, ur: NEGATIVE mg/dL
Specific Gravity, Urine: >1.03 — ABNORMAL HIGH (ref 1.005–1.030)

## 2015-06-15 LAB — BASIC METABOLIC PANEL
ANION GAP: 9 (ref 5–15)
BUN: 11 mg/dL (ref 6–20)
CALCIUM: 9.1 mg/dL (ref 8.9–10.3)
CO2: 20 mmol/L — ABNORMAL LOW (ref 22–32)
Chloride: 113 mmol/L — ABNORMAL HIGH (ref 101–111)
Creatinine, Ser: 0.96 mg/dL (ref 0.44–1.00)
GFR calc Af Amer: 58 mL/min — ABNORMAL LOW (ref >60)
GFR calc non Af Amer: 50 mL/min — ABNORMAL LOW (ref >60)
GLUCOSE: 103 mg/dL — AB (ref 65–99)
Potassium: 3.5 mmol/L (ref 3.5–5.1)
Sodium: 142 mmol/L (ref 135–145)

## 2015-06-15 LAB — URINE MICROSCOPIC-ADD ON

## 2015-06-15 NOTE — ED Provider Notes (Signed)
CSN: [id number]     Arrival date & time 06/15/15  2124 History  By signing my name below, I, Bethel Born, attest that this documentation has been prepared under the direction and in the presence of Shon Baton, MD. Electronically Signed: Bethel Born, ED Scribe. 06/15/2015. 11:40 PM     Chief Complaint  Patient presents with  . Vaginal Bleeding   The history is provided by the patient and a relative. No language interpreter was used.   Alexandra Riley is a 82 y.o. female who presents to the Emergency Department with family  complaining of 7/10 in severity, waxing and waning, generalized abdominal pain with onset approximately 2 weeks ago. The pain is worse with eating. Associated symptoms include difficulty urinating and increasing vaginal bleeding. Pt states that she has been "spotting" for 2 weeks but the bleeding increased today. Her last bowel movement was today and she notes that she frequently needs prune juice to pass stool.  Pt denies nausea and vomiting.   Past Medical History  Diagnosis Date  . Osteoporosis   . High cholesterol   . Constipation   . Glaucoma   . Shortness of breath on exertion    Past Surgical History  Procedure Laterality Date  . Tonsillectomy    . Cesarean section    . Cataract extraction w/phaco Right 01/20/2013    Procedure: CATARACT EXTRACTION PHACO AND INTRAOCULAR LENS PLACEMENT (IOC);  Surgeon: Gemma Payor, MD;  Location: [facility];  Service: Ophthalmology;  Laterality: Right;  CDE:  18.27  . Eye surgery    . Orif hip fracture Right 05/28/2014    Procedure: OPEN REDUCTION INTERNAL FIXATION HIP;  Surgeon: Darreld Mclean, MD;  Location: [facility];  Service: Orthopedics;  Laterality: Right;   History reviewed. No pertinent family history. Social History  Substance Use Topics  . Smoking status: Never Smoker   . Smokeless tobacco: Current User    Types: Snuff  . Alcohol Use: No   OB History    Gravida Para Term Preterm AB TAB SAB Ectopic Multiple  Living   4 3 3       3      Review of Systems  Constitutional: Negative for fever.  Respiratory: Negative for shortness of breath.   Cardiovascular: Negative for chest pain.  Gastrointestinal: Positive for abdominal pain. Negative for nausea and vomiting.  Genitourinary: Positive for dysuria and vaginal bleeding.  All other systems reviewed and are negative.  Allergies  Review of patient's allergies indicates no known allergies.  Home Medications   Prior to Admission medications   Medication Sig Start Date End Date Taking? Authorizing Provider  aspirin EC 81 MG tablet Take 81 mg by mouth every morning.   Yes Historical Provider, MD  atorvastatin (LIPITOR) 10 MG tablet Take 10 mg by mouth every evening.    Yes Historical Provider, MD  carisoprodol (SOMA) 350 MG tablet Take 350 mg by mouth at bedtime. *May take one to two additional tablets only if needed For sleep and muscle relaxant   Yes Historical Provider, MD  ciprofloxacin (CIPRO) 500 MG tablet Take 500 mg by mouth 2 (two) times daily. 5 day course starting on 06/14/15 06/14/15  Yes Historical Provider, MD  clonazePAM (KLONOPIN) 0.5 MG tablet Take 0.5 mg by mouth at bedtime. *May take one to two tablets daily as needed For anxiety   Yes Historical Provider, MD  dicyclomine (BENTYL) 10 MG capsule Take 10 mg by mouth 4 (four) times daily -  before meals  and at bedtime.   Yes Historical Provider, MD  HYDROcodone-acetaminophen (NORCO/VICODIN) 5-325 MG tablet Take 1 tablet by mouth every 4 (four) hours as needed for moderate pain (Must last 30 days.  Do not take and drive a car or use machinery.). 06/08/15  Yes Darreld Mclean, MD  mirtazapine (REMERON) 15 MG tablet Take 15 mg by mouth at bedtime. 06/03/15  Yes Historical Provider, MD  ondansetron (ZOFRAN ODT) 4 MG disintegrating tablet  ODT q4 hours prn nausea/vomit Patient taking differently: Take 4 mg by mouth every 4 (four) hours as needed for nausea or vomiting.  07/10/14  Yes Blane Ohara,  MD   BP 124/59 mmHg  Pulse 73  Temp(Src) 98 F (36.7 C) (Oral)  Resp 18  Wt 128 lb (58.06 kg)  SpO2 99% Physical Exam  Constitutional: She is oriented to person, place, and time. No distress.  HENT:  Head: Normocephalic and atraumatic.  Eyes: Pupils are equal, round, and reactive to light.  Cardiovascular: Normal rate, regular rhythm and normal heart sounds.   No murmur heard. Pulmonary/Chest: Effort normal and breath sounds normal. No respiratory distress. She has no wheezes.  Abdominal: Soft. Bowel sounds are normal. She exhibits no distension. There is tenderness. There is no rebound and no guarding.  Suprapubic tenderness to palpation without rebound or guarding  Genitourinary:  Limited speculum exam with dry blood in the vaginal vault, no active bleeding noted  Neurological: She is alert and oriented to person, place, and time.  Skin: Skin is warm and dry.  Psychiatric: She has a normal mood and affect.  Nursing note and vitals reviewed.   ED Course  Procedures (including critical care time) DIAGNOSTIC STUDIES: Oxygen Saturation is 99% on RA,  normal by my interpretation.    COORDINATION OF CARE: 11:12 PM Discussed treatment plan which includes lab work and CXR with pt at bedside and pt agreed to plan.  Labs Review Labs Reviewed  CBC WITH DIFFERENTIAL/PLATELET - Abnormal; Notable for the following:    RBC 3.66 (*)    Hemoglobin 11.3 (*)    HCT 34.6 (*)    All other components within normal limits  BASIC METABOLIC PANEL - Abnormal; Notable for the following:    Chloride 113 (*)    CO2 20 (*)    Glucose, Bld 103 (*)    GFR calc non Af Amer 50 (*)    GFR calc Af Amer 58 (*)    All other components within normal limits  URINALYSIS, ROUTINE W REFLEX MICROSCOPIC (NOT AT [facility]) - Abnormal; Notable for the following:    Specific Gravity, Urine >1.030 (*)    Hgb urine dipstick MODERATE (*)    Ketones, ur TRACE (*)    All other components within normal limits  URINE  MICROSCOPIC-ADD ON - Abnormal; Notable for the following:    Squamous Epithelial / LPF 0-5 (*)    Bacteria, UA RARE (*)    All other components within normal limits    Imaging Review Dg Abd Acute W/chest  06/16/2015  CLINICAL DATA:  82 year old female with abdominal pain EXAM: DG ABDOMEN ACUTE W/ 1V CHEST COMPARISON:  CT dated 07/10/2014 FINDINGS: The lungs are clear. No pleural effusion or pneumothorax. A 6 mm nodular density at the left lung base may represent a calcified granuloma appears stable compared to the old studies. The cardiac silhouette is within normal limits. The osseous structures are grossly intact. There is no evidence of bowel obstruction. No free air. A subcentimeter calcific focus in  the left upper abdomen may correspond to vascular calcification and less likely a renal stone. There is thoracolumbar scoliosis. There is osteopenia with degenerative changes of the spine. No acute fracture. Right femoral orthopedic hardware noted. IMPRESSION: No evidence of bowel obstruction. No acute cardiopulmonary process. Electronically Signed   By: Elgie Collard M.D.   On: 06/16/2015 00:49   I have personally reviewed and evaluated these images and lab results as part of my medical decision-making.   EKG Interpretation None      MDM   Final diagnoses:  Lower abdominal pain  Vaginal bleeding    Patient presents with lower abdominal pain and vaginal bleeding. Denies any other symptoms. Is generally well-appearing. Nontoxic. Afebrile. Does report urinary symptoms as well. She does appear to have some vaginal bleeding but no active bleeding at this time. No signs of peritonitis. Hemoglobin is stable. Other lab work is reassuring. Urinalysis without evidence of urinary tract infection. Acute abdominal series without evidence of obstruction. Given vaginal bleeding, feel the best imaging choice for the patient is a pelvic ultrasound. Discussed this with the patient and her family. Given the  duration of her symptoms, do not feel she needs an emergent CT tonight. We'll bring back tomorrow for an ultrasound. Discussed with them my concerns for abnormal uterine bleeding and the possibility of cancer. They stated understanding.  They were given strict return precautions.  After history, exam, and medical workup I feel the patient has been appropriately medically screened and is safe for discharge home. Pertinent diagnoses were discussed with the patient. Patient was given return precautions.  I personally performed the services described in this documentation, which was scribed in my presence. The recorded information has been reviewed and is accurate.    Shon Baton, MD 06/16/15 [phone]

## 2015-06-15 NOTE — ED Notes (Signed)
Cleaned pt to obtain in and out cath, pt has vaginal bleeding, it has dripped down pt legs.

## 2015-06-15 NOTE — ED Notes (Signed)
Pt c/o vaginal bleeding x 2 weeks with abd pain.

## 2015-06-16 ENCOUNTER — Ambulatory Visit (HOSPITAL_COMMUNITY): Admit: 2015-06-16 | Payer: Medicare Other

## 2015-06-16 NOTE — Discharge Instructions (Signed)
Abnormal Uterine Bleeding Abnormal uterine bleeding can affect women at various stages in life, including teenagers, women in their reproductive years, pregnant women, and women who have reached menopause. Several kinds of uterine bleeding are considered abnormal, including:  Bleeding or spotting between periods.   Bleeding after sexual intercourse.   Bleeding that is heavier or more than normal.   Periods that last longer than usual.  Bleeding after menopause.  Many cases of abnormal uterine bleeding are minor and simple to treat, while others are more serious. Any type of abnormal bleeding should be evaluated by your health care provider. Treatment will depend on the cause of the bleeding. HOME CARE INSTRUCTIONS Monitor your condition for any changes. The following actions may help to alleviate any discomfort you are experiencing:  Avoid the use of tampons and douches as directed by your health care provider.  Change your pads frequently. You should get regular pelvic exams and Pap tests. Keep all follow-up appointments for diagnostic tests as directed by your health care provider.  SEEK MEDICAL CARE IF:   Your bleeding lasts more than 1 week.   You feel dizzy at times.  SEEK IMMEDIATE MEDICAL CARE IF:   You pass out.   You are changing pads every 15 to 30 minutes.   You have abdominal pain.  You have a fever.   You become sweaty or weak.   You are passing large blood clots from the vagina.   You start to feel nauseous and vomit. MAKE SURE YOU:   Understand these instructions.  Will watch your condition.  Will get help right away if you are not doing well or get worse.   This information is not intended to replace advice given to you by your health care provider. Make sure you discuss any questions you have with your health care provider.   Document Released: 04/17/2005 Document Revised: 04/22/2013 Document Reviewed: 11/14/2012 Elsevier Interactive  Patient Education 2016 Else[employer] Inc.  Abdominal Pain, Adult Many things can cause abdominal pain. Usually, abdominal pain is not caused by a disease and will improve without treatment. It can often be observed and treated at home. Your health care provider will do a physical exam and possibly order blood tests and X-rays to help determine the seriousness of your pain. However, in many cases, more time must pass before a clear cause of the pain can be found. Before that point, your health care provider may not know if you need more testing or further treatment. HOME CARE INSTRUCTIONS Monitor your abdominal pain for any changes. The following actions may help to alleviate any discomfort you are experiencing:  Only take over-the-counter or prescription medicines as directed by your health care provider.  Do not take laxatives unless directed to do so by your health care provider.  Try a clear liquid diet (broth, tea, or water) as directed by your health care provider. Slowly move to a bland diet as tolerated. SEEK MEDICAL CARE IF:  You have unexplained abdominal pain.  You have abdominal pain associated with nausea or diarrhea.  You have pain when you urinate or have a bowel movement.  You experience abdominal pain that wakes you in the night.  You have abdominal pain that is worsened or improved by eating food.  You have abdominal pain that is worsened with eating fatty foods.  You have a fever. SEEK IMMEDIATE MEDICAL CARE IF:  Your pain does not go away within 2 hours.  You keep throwing up (vomiting).  Your  pain is felt only in portions of the abdomen, such as the right side or the left lower portion of the abdomen.  You pass bloody or black tarry stools. MAKE SURE YOU:  Understand these instructions.  Will watch your condition.  Will get help right away if you are not doing well or get worse.   This information is not intended to replace advice given to you by your  health care provider. Make sure you discuss any questions you have with your health care provider.   Document Released: 01/25/2005 Document Revised: 01/06/2015 Document Reviewed: 12/25/2012 Elsevier Interactive Patient Education [employer]

## 2015-06-16 NOTE — ED Notes (Signed)
Patient and family verbalize understanding of discharge instructions, home care and follow up care if needed. Patient and family out of department at this time.

## 2015-06-18 ENCOUNTER — Ambulatory Visit (HOSPITAL_COMMUNITY)
Admission: RE | Admit: 2015-06-18 | Discharge: 2015-06-18 | Disposition: A | Discharge status: Home / Self Care | Payer: Medicare Other | Source: Ambulatory Visit | Attending: Emergency Medicine | Admitting: Emergency Medicine

## 2015-06-18 DIAGNOSIS — N939 Abnormal uterine and vaginal bleeding, unspecified: Secondary | ICD-10-CM | POA: Diagnosis present

## 2015-06-18 NOTE — ED Provider Notes (Signed)
Ultrasound of pelvis on 06/18/15 reveals debris/blood/fluid in uterus. This was discussed with the patient, her 2 daughters, and Dr. Christin Bach. Dr. Emelda Fear will follow-up with the patient in the office for further evaluation.  Donnetta Hutching, MD 06/18/15 1750

## 2015-06-24 ENCOUNTER — Ambulatory Visit (INDEPENDENT_AMBULATORY_CARE_PROVIDER_SITE_OTHER): Payer: Medicare Other | Admitting: Obstetrics and Gynecology

## 2015-06-24 VITALS — BP 100/60

## 2015-06-24 DIAGNOSIS — N882 Stricture and stenosis of cervix uteri: Secondary | ICD-10-CM | POA: Diagnosis not present

## 2015-06-24 DIAGNOSIS — N950 Postmenopausal bleeding: Secondary | ICD-10-CM | POA: Insufficient documentation

## 2015-06-24 DIAGNOSIS — K5900 Constipation, unspecified: Secondary | ICD-10-CM

## 2015-06-24 DIAGNOSIS — R1084 Generalized abdominal pain: Secondary | ICD-10-CM

## 2015-06-24 DIAGNOSIS — N857 Hematometra: Secondary | ICD-10-CM | POA: Insufficient documentation

## 2015-06-24 DIAGNOSIS — R110 Nausea: Secondary | ICD-10-CM

## 2015-06-24 NOTE — Progress Notes (Signed)
Patient ID: Alexandra Riley, female   DOB: 1921/06/06, 82 y.o.   MRN: [id number]   [facility] Clinic Visit  Patient name: Alexandra Riley MRN [id number]  Date of birth: 1922-02-06  CC & HPI:  [name] is a 82 y.o. female presenting today for follow up after ED visit on 06/15/15 for vaginal bleeding. Pt had pelvic US on 06/17/15 showing dilated endometrial canal with fluid. Pt is not taking any blood thinners. Pt also complains of constipation, nausea and abdominal pain for weeks. She states that her abdominal pain is worsened by eating.    ROS:  A complete 10 system review of systems was obtained and all systems are negative except as noted in the HPI and PMH.    Pertinent History Reviewed:   Reviewed: Significant for cesarean section  Medical         Past Medical History  Diagnosis Date  . Osteoporosis   . High cholesterol   . Constipation   . Glaucoma   . Shortness of breath on exertion                               Surgical Hx:    Past Surgical History  Procedure Laterality Date  . Tonsillectomy    . Cesarean section    . Cataract extraction w/phaco Right 01/20/2013    Procedure: CATARACT EXTRACTION PHACO AND INTRAOCULAR LENS PLACEMENT (IOC);  Surgeon: Gemma Payor, MD;  Location: [facility];  Service: Ophthalmology;  Laterality: Right;  CDE:  18.27  . Eye surgery    . Orif hip fracture Right 05/28/2014    Procedure: OPEN REDUCTION INTERNAL FIXATION HIP;  Surgeon: Darreld Mclean, MD;  Location: [facility];  Service: Orthopedics;  Laterality: Right;   Medications: Reviewed & Updated - see associated section                       Current outpatient prescriptions:  .  aspirin EC 81 MG tablet, Take 81 mg by mouth every morning., Disp: , Rfl:  .  atorvastatin (LIPITOR) 10 MG tablet, Take 10 mg by mouth every evening. , Disp: , Rfl:  .  ciprofloxacin (CIPRO) 500 MG tablet, Take 500 mg by mouth 2 (two) times daily. 5 day course starting on 06/14/15, Disp: , Rfl:  .  clonazePAM  (KLONOPIN) 0.5 MG tablet, Take 0.5 mg by mouth at bedtime. *May take one to two tablets daily as needed For anxiety, Disp: , Rfl:  .  HYDROcodone-acetaminophen (NORCO/VICODIN) 5-325 MG tablet, Take 1 tablet by mouth every 4 (four) hours as needed for moderate pain (Must last 30 days.  Do not take and drive a car or use machinery.)., Disp: 120 tablet, Rfl: 0 .  ondansetron (ZOFRAN ODT) 4 MG disintegrating tablet,  ODT q4 hours prn nausea/vomit (Patient taking differently: Take 4 mg by mouth every 4 (four) hours as needed for nausea or vomiting. ), Disp: 4 tablet, Rfl: 0 .  mirtazapine (REMERON) 15 MG tablet, Take 15 mg by mouth at bedtime. Reported on 06/24/2015, Disp: , Rfl:    Social History: Reviewed -  reports that she has never smoked. Her smokeless tobacco use includes Snuff.  Objective Findings:  Vitals: Blood pressure 100/60, SpO2 98 %.  Physical Examination:  GENERAL: Well-developed, well-nourished female in no acute distress. Elderly.  ABDOMEN: Soft, nontender, nondistended. No organomegaly. PELVIC: Normal external female genitalia. Vagina is pink and rugated.  Normal discharge. Normal cervix contour. Uterus is normal in size. No adnexal mass or tenderness.  RECTAL: normal tone, no masses  EXTREMITIES: No cyanosis, clubbing, or edema, 2+ distal pulses.   Endometrial Biopsy: Patient given informed consent, signed copy in the chart, time out was performed. Time out taken. The patient was placed in the lithotomy position and the cervix brought into view with sterile speculum.  Portio of cervix cleansed x 2 with betadine swabs.  A tenaculum was placed in the anterior lip of the cervix. The uterus was sounded for depth of 1 cm- Cervix was stenotic and would not allow sounding.  Milex uterine Explora 3 mm was introduced to into the uterus, suction created,  and an endometrial sample was NOT obtained. Biopsy limited due to tissue thinning. All equipment was removed and accounted for.   The  patient tolerated the procedure well.    Patient given post procedure instructions.    Discussed with pt [place] results. At end of discussion, pt had opportunity to ask questions and has no further questions at this time.   Greater than 50% was spent in counseling and coordination of care with the patient. Total time greater than: 25 minutes    Assessment & Plan:   A:  1. Postmenopausal vaginal bleeding / hematometria 2 cervical stenosis preventing Endometrial biopsy 2. [place] on 06/17/15 showed dilated endometrial cavity with fluid 3. Hematometra  3. Endometrial biopsy limited d/t tissue thinning, stenotic cervix   4 Family concern over pt anorexia, referrred to Dr Elmyra Ricks office. P:  1. Will rx premarin vaginal cream to be used at night 3x/week until follow up  2. Follow up in 2-3 weeks for endometrial biopsy repeat attempt 3 pt to Dr Selena Batten office for GI eval or referral to GI.   By signing my name below, I, [name], attest that this documentation has been prepared under the direction and in the presence of Tilda Burrow, MD. Electronically Signed: [name], ED Scribe. 06/24/2015. 3:04 PM.  I personally performed the services described in this documentation, which was SCRIBED in my presence. The recorded information has been reviewed and considered accurate. It has been edited as necessary during review. Tilda Burrow, MD

## 2015-06-29 ENCOUNTER — Telehealth: Payer: Self-pay | Admitting: *Deleted

## 2015-06-29 ENCOUNTER — Other Ambulatory Visit: Payer: Self-pay | Admitting: Obstetrics and Gynecology

## 2015-06-29 MED ORDER — ESTRADIOL 0.1 MG/GM VA CREA: 0.5000 | TOPICAL_CREAM | VAGINAL | Status: DC

## 2015-06-29 NOTE — Telephone Encounter (Signed)
Pt aware that rx has been sent to her pharmacy

## 2015-07-02 ENCOUNTER — Encounter: Payer: Self-pay | Admitting: Internal Medicine

## 2015-07-05 ENCOUNTER — Ambulatory Visit (INDEPENDENT_AMBULATORY_CARE_PROVIDER_SITE_OTHER): Payer: Medicare Other | Admitting: Gastroenterology

## 2015-07-05 ENCOUNTER — Encounter: Payer: Self-pay | Admitting: Gastroenterology

## 2015-07-05 VITALS — BP 112/57 | HR 80 | Temp 97.8°F | Ht 68.0 in | Wt 128.8 lb

## 2015-07-05 DIAGNOSIS — R109 Unspecified abdominal pain: Secondary | ICD-10-CM

## 2015-07-05 NOTE — Patient Instructions (Signed)
I have ordered a special CT scan of your belly to see what is going on.  For your constipation: take 1 Amitiza gelcap each morning WITH FOOD to avoid nausea. You may increase this to twice a day if needed. I provided samples. If you like this, let me know and I will send in a prescription.  Start taking Ensure or Boost 2-3 times a day between meals. It is important to make sure you are getting enough nutrition.

## 2015-07-06 ENCOUNTER — Other Ambulatory Visit (HOSPITAL_COMMUNITY): Payer: Self-pay | Admitting: Internal Medicine

## 2015-07-06 DIAGNOSIS — R52 Pain, unspecified: Secondary | ICD-10-CM

## 2015-07-09 ENCOUNTER — Ambulatory Visit (HOSPITAL_COMMUNITY)
Admission: RE | Admit: 2015-07-09 | Discharge: 2015-07-09 | Disposition: A | Discharge status: Home / Self Care | Payer: Medicare Other | Source: Ambulatory Visit | Attending: Internal Medicine | Admitting: Internal Medicine

## 2015-07-09 DIAGNOSIS — K824 Cholesterolosis of gallbladder: Secondary | ICD-10-CM | POA: Diagnosis not present

## 2015-07-09 DIAGNOSIS — R938 Abnormal findings on diagnostic imaging of other specified body structures: Secondary | ICD-10-CM | POA: Insufficient documentation

## 2015-07-09 DIAGNOSIS — R52 Pain, unspecified: Secondary | ICD-10-CM | POA: Diagnosis not present

## 2015-07-13 ENCOUNTER — Ambulatory Visit (HOSPITAL_COMMUNITY): Payer: Medicare Other

## 2015-07-14 ENCOUNTER — Telehealth: Payer: Self-pay | Admitting: Gastroenterology

## 2015-07-14 ENCOUNTER — Encounter: Payer: Self-pay | Admitting: Gastroenterology

## 2015-07-14 NOTE — Assessment & Plan Note (Signed)
82 year old female with abdominal pain, fear of eating, weight loss, and mild nausea noted. Adamantly declining any invasive procedures. Concern for gastritis, PUD, malignancy, and high concern for possible atherosclerotic disease especially with her fear of eating and weight loss noted. Discussed at length with patient. Recommend CTA, and I also recommended a possible endoscopy but she is declining this currently. Will pursue CTA as first step in plan of care. Add Amitiza 8 mcg po daily to BID for constipation, as this could also be contributing to constellation of symptoms. Weight loss noted and concerning. This is likely multifactorial. Patient is quite spirited and declining extensive work-up. Discussed with her that lack of eating will lead to malnourishment and decline. She is fully aware of this. CTA ordered, Amitiza samples provided, and ensure 2-3 times per day recommended. Further recommendations after imaging.

## 2015-07-14 NOTE — Progress Notes (Signed)
Primary Care Physician:  Pearson Grippe, MD Primary Gastroenterologist:  Dr. Jena Gauss   Chief Complaint  Patient presents with  . Abdominal Pain    HPI:   Alexandra Riley is a 82 y.o. female presenting today at the request of her PCP secondary to abdominal pain. She is present with her daughter, Manson Allan, and niece, Mitzi Davenport. She lives with [place]. She states she has abdominal pain with eating. Nerve pill sometimes makes it better. Duration several months. Sometimes nauseated. No vomiting. Notes constipation and drinks prune juice. Last colonoscopy in remote past by Dr. Karilyn Cota. States hard to swallow pills and dentures make eating difficult. Eats eggs, sausage, hot dogs. Fells like wants to have a BM but can't. Doesn't want anything to eat. AFRAID to eat due to pain. Points to epigastric region. Weight loss noted since last year around 20 lbs. Adamant that she DOES NOT want any upper endoscopy.   Past Medical History  Diagnosis Date  . Osteoporosis   . High cholesterol   . Constipation   . Glaucoma   . Shortness of breath on exertion     Past Surgical History  Procedure Laterality Date  . Tonsillectomy    . Cesarean section    . Cataract extraction w/phaco Right 01/20/2013    Procedure: CATARACT EXTRACTION PHACO AND INTRAOCULAR LENS PLACEMENT (IOC);  Surgeon: Gemma Payor, MD;  Location: [facility];  Service: Ophthalmology;  Laterality: Right;  CDE:  18.27  . Eye surgery    . Orif hip fracture Right 05/28/2014    Procedure: OPEN REDUCTION INTERNAL FIXATION HIP;  Surgeon: Darreld Mclean, MD;  Location: [facility];  Service: Orthopedics;  Laterality: Right;    Current Outpatient Prescriptions  Medication Sig Dispense Refill  . aspirin EC 81 MG tablet Take 81 mg by mouth every morning.    Marland Kitchen atorvastatin (LIPITOR) 10 MG tablet Take 10 mg by mouth every evening.     . clonazePAM (KLONOPIN) 0.5 MG tablet Take 0.5 mg by mouth at bedtime. *May take one to two tablets daily as needed For anxiety    .  estradiol (ESTRACE VAGINAL) 0.1 MG/GM vaginal cream Place 0.5 Applicatorfuls vaginally 3 (three) times a week. 42.5 g 2  . HYDROcodone-acetaminophen (NORCO/VICODIN) 5-325 MG tablet Take 1 tablet by mouth every 4 (four) hours as needed for moderate pain (Must last 30 days.  Do not take and drive a car or use machinery.). 120 tablet 0   No current facility-administered medications for this visit.    Allergies as of 07/05/2015  . (No Known Allergies)    Family History  Problem Relation Age of Onset  . Colon cancer Neg Hx     Social History   Social History  . Marital Status: Widowed    Spouse Name: N/A  . Number of Children: N/A  . Years of Education: N/A   Occupational History  . Not on file.   Social History Main Topics  . Smoking status: Never Smoker   . Smokeless tobacco: Current User    Types: Snuff  . Alcohol Use: No  . Drug Use: No  . Sexual Activity: No   Other Topics Concern  . Not on file   Social History Narrative    Review of Systems: Gen: see HPI  CV: rare palpitations Resp: +DOE GI: see HPI  GU : Denies urinary burning, urinary frequency, urinary hesitancy MS: +joint pain Derm: Denies rash, itching, dry skin Psych: +anxiety Heme: Denies bruising, bleeding, and enlarged lymph nodes.  Physical Exam: BP 112/57 mmHg  Pulse 80  Temp(Src) 97.8 F (36.6 C) (Oral)  Ht 5\' 8"  (1.727 m)  Wt 128 lb 12.8 oz (58.423 kg)  BMI 19.59 kg/m2 General:   Alert and oriented. Pleasant and cooperative. Appears stated age.  Head:  Normocephalic and atraumatic. Eyes:  Without icterus, sclera clear and conjunctiva pink.  Ears:  Normal auditory acuity. Mouth:  No deformity or lesions Lungs:  Clear to auscultation bilaterally. No wheezes, rales, or rhonchi. No distress.  Heart:  S1, S2 present without murmurs appreciated.  Abdomen:  +BS, soft, non-tender and non-distended. Examined in chair, unable to get on exam table Rectal:  Deferred  Msk:  Kyphosis  Extremities:   Without edema. Neurologic:  Alert and  oriented x4;  grossly normal neurologically. Psych:  Alert and cooperative. Normal mood and affect.   Lab Results  Component Value Date   WBC 6.6 06/15/2015   HGB 11.3* 06/15/2015   HCT 34.6* 06/15/2015   MCV 94.5 06/15/2015   PLT 173 06/15/2015   Lab Results  Component Value Date   CREATININE 0.96 06/15/2015   BUN 11 06/15/2015   NA 142 06/15/2015   K 3.5 06/15/2015   CL 113* 06/15/2015   CO2 20* 06/15/2015   Lab Results  Component Value Date   ALT 14 11/12/2014   AST 24 11/12/2014   ALKPHOS 49 11/12/2014   BILITOT 1.0 11/12/2014

## 2015-07-14 NOTE — Telephone Encounter (Signed)
Patient did not complete CTA. Can we find out what's going on?

## 2015-07-15 ENCOUNTER — Encounter: Payer: Self-pay | Admitting: Obstetrics and Gynecology

## 2015-07-15 ENCOUNTER — Ambulatory Visit (INDEPENDENT_AMBULATORY_CARE_PROVIDER_SITE_OTHER): Payer: Medicare Other | Admitting: Obstetrics and Gynecology

## 2015-07-15 ENCOUNTER — Other Ambulatory Visit: Payer: Self-pay | Admitting: Obstetrics and Gynecology

## 2015-07-15 VITALS — BP 110/70 | Wt 132.0 lb

## 2015-07-15 DIAGNOSIS — N882 Stricture and stenosis of cervix uteri: Secondary | ICD-10-CM

## 2015-07-15 DIAGNOSIS — N950 Postmenopausal bleeding: Secondary | ICD-10-CM | POA: Diagnosis not present

## 2015-07-15 DIAGNOSIS — N854 Malposition of uterus: Secondary | ICD-10-CM | POA: Diagnosis not present

## 2015-07-15 DIAGNOSIS — M79662 Pain in left lower leg: Secondary | ICD-10-CM

## 2015-07-15 DIAGNOSIS — M25472 Effusion, left ankle: Secondary | ICD-10-CM

## 2015-07-15 NOTE — Telephone Encounter (Signed)
Talked with daughter(Vilma) she did not know about the appointment so we have her rescheduled on 07/20/15 @ 1:30. LMOM for the daughter.

## 2015-07-15 NOTE — Progress Notes (Signed)
cc'ed to pcp °

## 2015-07-15 NOTE — Progress Notes (Signed)
Patient ID: Alexandra Riley, female   DOB: 01-20-1922, 82 y.o.   MRN: [id number]  Pt here for 2nd attempt at endometrial biopsy. First attempt was limited by cervical stenosis. Pt was given rx for premarin vaginal cream to be used at night 3x/week until follow up today. Niece reports she has been administering the cream as prescribed.   Pt also complains of left ankle and shin pain onset last night after her walker fell onto her leg. The area is swollen, erythematous and bruised. She is not on anticoagulants. She has tried ice with moderate relief of pain. Pt has been ambulatory without difficulty and without pain on the foot. She uses a walker at baseline. She denies falls, additional injuries.   Endometrial Biopsy Patient given informed consent, signed copy in the chart, time out was performed. Appropriate time out taken.  The patient was placed in the lithotomy position and the cervix brought into view with sterile speculum.  Portio of cervix cleansed x 2 with betadine swabs.  A tenaculum was placed in the anterior lip of the cervix.  The uterus was sounded for depth of 9 cm. A pipelle was introduced to into the uterus, suction created, 12 cc blood was drawn off, and an endometrial sample was obtained. All equipment was removed and accounted for.  The patient tolerated the procedure well.   Bedside transvaginal US showed tiny anteverted uterus with endometrial lining 4-6 mm thick, thinned endometrial stripe.    Patient given post procedure instructions. The patient will return in 2 weeks for results. Pt advised to continue with heat compress and RICE method for her ankle pain.    I spent 25 minutes with the visit with >than 50% spent in counseling and direct patient care.   [name] [phone] (accompanied the pt today and requests she be called with biopsy results)   By signing my name below, I, Doreatha MartinEva Mathews, attest that this documentation has been prepared under the direction and in the  presence of Tilda BurrowJohn Ferguson V, MD. Electronically Signed: Doreatha MartinEva Mathews, ED Scribe. 07/15/2015. 2:16 PM.  I personally performed the services described in this documentation, which was SCRIBED in my presence. The recorded information has been reviewed and considered accurate. It has been edited as necessary during review. Tilda BurrowFERGUSON,JOHN V, MD

## 2015-07-19 ENCOUNTER — Telehealth: Payer: Self-pay | Admitting: *Deleted

## 2015-07-19 NOTE — Telephone Encounter (Signed)
Pt's daughter, Maretta LosVilma , aware that results are not back yet and it may be Wednesday before we know anything.

## 2015-07-20 ENCOUNTER — Ambulatory Visit (HOSPITAL_COMMUNITY)
Admission: RE | Admit: 2015-07-20 | Discharge: 2015-07-20 | Disposition: A | Discharge status: Home / Self Care | Payer: Medicare Other | Source: Ambulatory Visit | Attending: Gastroenterology | Admitting: Gastroenterology

## 2015-07-20 DIAGNOSIS — I700 Atherosclerosis of aorta: Secondary | ICD-10-CM | POA: Diagnosis not present

## 2015-07-20 DIAGNOSIS — R109 Unspecified abdominal pain: Secondary | ICD-10-CM

## 2015-07-20 DIAGNOSIS — I774 Celiac artery compression syndrome: Secondary | ICD-10-CM | POA: Diagnosis not present

## 2015-07-20 MED ORDER — IOHEXOL 350 MG/ML SOLN
100.0000 mL | Freq: Once | INTRAVENOUS | Status: AC | PRN
  Administered 2015-07-20: 100 mL via INTRAVENOUS

## 2015-07-21 ENCOUNTER — Telehealth: Payer: Self-pay | Admitting: Obstetrics and Gynecology

## 2015-07-21 ENCOUNTER — Ambulatory Visit: Payer: Medicare Other | Admitting: Gastroenterology

## 2015-07-21 NOTE — Telephone Encounter (Signed)
Pt's daughter aware of results °

## 2015-07-26 ENCOUNTER — Telehealth: Payer: Self-pay | Admitting: *Deleted

## 2015-07-26 ENCOUNTER — Other Ambulatory Visit: Payer: Self-pay

## 2015-07-26 ENCOUNTER — Telehealth: Payer: Self-pay | Admitting: Internal Medicine

## 2015-07-26 DIAGNOSIS — I771 Stricture of artery: Secondary | ICD-10-CM

## 2015-07-26 DIAGNOSIS — I774 Celiac artery compression syndrome: Secondary | ICD-10-CM

## 2015-07-26 NOTE — Telephone Encounter (Signed)
I spoke to Alexandra Riley , pt needs an u/s now either in our office, or hospital, to check out endometrium. Pt had hematometria, and now has a benign biopsy

## 2015-07-26 NOTE — Telephone Encounter (Signed)
Daughter called to check on CT results that were done last week. Please advise. [facility] [phone]

## 2015-07-26 NOTE — Telephone Encounter (Signed)
Routing to AS 

## 2015-07-26 NOTE — Progress Notes (Signed)
Quick Note:  IMA is patent, calcified plaque at origin of celiac trunk causing mild to moderate stenosis, SMA with plaque at the origin but without significant stenosis. 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 Intervention for this would be surgery IF symptomatic. At her age, I doubt this would be undertaken. [facility] [place] to [facility] any options. In the meantime, does she have abdominal pain or any other concerning features? I have copied to the clinical pool to get the referral started. ______

## 2015-07-26 NOTE — Telephone Encounter (Signed)
Please see result notes.  

## 2015-07-26 NOTE — Telephone Encounter (Signed)
Tried to call pt- NA-LMOM with results.  Routing to clinical pool, pt needs referral to vascular surgeon. I asked pt to call back.

## 2015-07-26 NOTE — Telephone Encounter (Signed)
[facility]

## 2015-07-28 ENCOUNTER — Other Ambulatory Visit: Payer: Self-pay | Admitting: Obstetrics and Gynecology

## 2015-07-28 ENCOUNTER — Telehealth: Payer: Self-pay | Admitting: Orthopaedic Surgery

## 2015-07-28 DIAGNOSIS — N857 Hematometra: Secondary | ICD-10-CM

## 2015-07-28 NOTE — Telephone Encounter (Signed)
Patient requests Norco  7.5-325 mgs.  Qty  120  Sig: Take 1 tablet by mouth every 4 (four) hours as needed for moderate pain (Must last 30 days. Do not take and drive a car or use machinery.).

## 2015-07-29 MED ORDER — HYDROCODONE-ACETAMINOPHEN 5-325 MG PO TABS: 1.0000 | ORAL_TABLET | ORAL | Status: DC | PRN

## 2015-07-29 NOTE — Telephone Encounter (Signed)
Rx done. 

## 2015-08-02 ENCOUNTER — Ambulatory Visit: Payer: Medicare Other | Admitting: Obstetrics and Gynecology

## 2015-08-02 ENCOUNTER — Other Ambulatory Visit: Payer: Medicare Other

## 2015-08-04 ENCOUNTER — Encounter: Payer: Self-pay | Admitting: Obstetrics and Gynecology

## 2015-08-04 ENCOUNTER — Ambulatory Visit (INDEPENDENT_AMBULATORY_CARE_PROVIDER_SITE_OTHER): Payer: Medicare Other

## 2015-08-04 ENCOUNTER — Ambulatory Visit (INDEPENDENT_AMBULATORY_CARE_PROVIDER_SITE_OTHER): Payer: Medicare Other | Admitting: Obstetrics and Gynecology

## 2015-08-04 VITALS — BP 120/80

## 2015-08-04 DIAGNOSIS — R143 Flatulence: Secondary | ICD-10-CM | POA: Diagnosis not present

## 2015-08-04 DIAGNOSIS — N857 Hematometra: Secondary | ICD-10-CM | POA: Diagnosis not present

## 2015-08-04 DIAGNOSIS — N950 Postmenopausal bleeding: Secondary | ICD-10-CM | POA: Diagnosis not present

## 2015-08-04 DIAGNOSIS — N840 Polyp of corpus uteri: Secondary | ICD-10-CM

## 2015-08-04 NOTE — Progress Notes (Signed)
[facility] Visit  Patient name: Alexandra Riley MRN [id number]  Date of birth: 01-14-1922  CC & HPI:  Alexandra Riley is a 82 y.o. female presenting today for a transvaginal ultrasound for follow-up of hematometra. Patient had been seen twice in my office for postmenopausal bleeding, per medical records. During her last visit on 07/15/15, an endometrial biopsy revealed an endometrial polyp with no malignant or premalignant characteristics - plan was to perform u/s in office to assess polyp size. The ultrasound today shows a thinned endometrium, 1-2 mm thick, with no endometrial fluid seen.  Patient also complains of chronic, moderate, flatus and abdominal cramping that are worse with eating.  ROS:  ROS Positive for increased flatus and abdominal cramping after eating  Pertinent History Reviewed:   Reviewed: Significant for Cesarean section Medical         Past Medical History  Diagnosis Date  . Osteoporosis   . High cholesterol   . Constipation   . Glaucoma   . Shortness of breath on exertion                               Surgical Hx:    Past Surgical History  Procedure Laterality Date  . Tonsillectomy    . Cesarean section    . Cataract extraction w/phaco Right 01/20/2013    Procedure: CATARACT EXTRACTION PHACO AND INTRAOCULAR LENS PLACEMENT (IOC);  Surgeon: Gemma PayorKerry Hunt, MD;  Location: [facility];  Service: Ophthalmology;  Laterality: Right;  CDE:  18.27  . Eye surgery    . Orif hip fracture Right 05/28/2014    Procedure: OPEN REDUCTION INTERNAL FIXATION HIP;  Surgeon: Darreld McleanWayne Keeling, MD;  Location: [facility];  Service: Orthopedics;  Laterality: Right;   Medications: Reviewed & Updated - see associated section                       Current outpatient prescriptions:  .  aspirin EC 81 MG tablet, Take 81 mg by mouth every morning., Disp: , Rfl:  .  atorvastatin (LIPITOR) 20 MG tablet, , Disp: , Rfl:  .  clonazePAM (KLONOPIN) 0.5 MG tablet, Take 0.5 mg by mouth at bedtime.  *May take one to two tablets daily as needed For anxiety, Disp: , Rfl:  .  estradiol (ESTRACE VAGINAL) 0.1 MG/GM vaginal cream, Place 0.5 Applicatorfuls vaginally 3 (three) times a week., Disp: 42.5 g, Rfl: 2 .  HYDROcodone-acetaminophen (NORCO/VICODIN) 5-325 MG tablet, Take 1 tablet by mouth every 4 (four) hours as needed for moderate pain (Must last 30 days.  Do not take and drive a car or use machinery.)., Disp: 120 tablet, Rfl: 0   Social History: Reviewed -  reports that she has never smoked. Her smokeless tobacco use includes Snuff.  Objective Findings:  Vitals: Blood pressure 120/80.  Physical Examination:   Discussed with pt transvaginal ultrasound results. At end of discussion, pt had opportunity to ask questions and has no further questions at this time.   Greater than 50% was spent in counseling and coordination of care with the patient. Total time greater than: 15 minutes   Assessment & Plan:   A:  1. Pelvic ultrasound in-office today shows 1.5-mm thin endometrium.  2. Flatus and abdominal cramping, worse with eating.   P:  1. Ultrasound results reviewed with pt and caregiver. Pt reassured. 2. Prescribed Miralax for GI complaints.  3. Return in 1 year  for annual exam/prn.    By signing my name below, I, Ronney Lion, attest that this documentation has been prepared under the direction and in the presence of Tilda Burrow, MD. Electronically Signed: Ronney Lion, ED Scribe. 08/04/2015. 3:27 PM.   I personally performed the services described in this documentation, which was SCRIBED in my presence. The recorded information has been reviewed and considered accurate. It has been edited as necessary during review. Tilda Burrow, MD

## 2015-08-04 NOTE — Progress Notes (Signed)
PELVIC US TA/TV: anteverted uterus w/mult.small calcification,two simple myometrial cysts mid uterus( #1) 6x 5 x 7, (#2) 4 x 4 x 4 mm,EEC 1.195mm, NO endometrial  fluid seen on today's ultrasound,unable to see ov's,bilat adnexa's wnl,pelvic pain during ultrasound

## 2015-08-06 ENCOUNTER — Encounter: Payer: Self-pay | Admitting: Surgery

## 2015-08-17 ENCOUNTER — Ambulatory Visit (INDEPENDENT_AMBULATORY_CARE_PROVIDER_SITE_OTHER): Payer: Medicare Other | Admitting: Surgery

## 2015-08-17 ENCOUNTER — Encounter: Payer: Self-pay | Admitting: Surgery

## 2015-08-17 VITALS — BP 117/64 | HR 66 | Temp 97.3°F | Resp 14 | Ht 63.0 in | Wt 126.0 lb

## 2015-08-17 DIAGNOSIS — I774 Celiac artery compression syndrome: Secondary | ICD-10-CM | POA: Diagnosis not present

## 2015-08-17 NOTE — Progress Notes (Signed)
[facility]  Patient name: Alexandra Riley MRN: [id number] DOB: 06-29-1921 Sex: female  REASON FOR CONSULT: Abd pain Referring Physician:  Dr. Eden EmmsaRIZA  HPI: Alexandra Riley is a 82 y.o. female, who is referred today for evaluation of abdominal pain.  The patient states that she has had abdominal pain for several months.  She states that it is sometimes alleviated with nerve pill.  She does take prune juice for constipation.  This has caused her to decrease her eating as eating makes it worse.  She has lost approximately 10-20 pounds over the past year.  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  Her superior mesenteric artery and inferior mesenteric artery were widely patent.  She denies blood in her stool.  The patient has refused upper endoscopy. She is medically managed for hypercholesterolemia.  She is a nonsmoker. Past Medical History  Diagnosis Date  . Osteoporosis   . High cholesterol   . Constipation   . Glaucoma   . Shortness of breath on exertion     Family History  Problem Relation Age of Onset  . Colon cancer Neg Hx     SOCIAL HISTORY: Social History   Social History  . Marital Status: Widowed    Spouse Name: N/A  . Number of Children: N/A  . Years of Education: N/A   Occupational History  . Not on file.   Social History Main Topics  . Smoking status: Never Smoker   . Smokeless tobacco: Current User    Types: Snuff  . Alcohol Use: No  . Drug Use: No  . Sexual Activity: No   Other Topics Concern  . Not on file   Social History Narrative    No Known Allergies  Current Outpatient Prescriptions  Medication Sig Dispense Refill  . aspirin EC 81 MG tablet Take 81 mg by mouth every morning.    Marland Kitchen. atorvastatin (LIPITOR) 20 MG tablet     . clonazePAM (KLONOPIN) 0.5 MG tablet Take 0.5 mg by mouth at bedtime. *May take one to two tablets daily as  needed For anxiety    . HYDROcodone-acetaminophen (NORCO/VICODIN) 5-325 MG tablet Take 1 tablet by mouth every 4 (four) hours as needed for moderate pain (Must last 30 days.  Do not take and drive a car or use machinery.). 120 tablet 0  . estradiol (ESTRACE VAGINAL) 0.1 MG/GM vaginal cream Place 0.5 Applicatorfuls vaginally 3 (three) times a week. (Patient not taking: Reported on 08/17/2015) 42.5 g 2   No current facility-administered medications for this visit.    REVIEW OF SYSTEMS:  [X]  denotes positive finding, [ ]  denotes negative finding Cardiac  Comments:  Chest pain or chest pressure:    Shortness of breath upon exertion: X   Short of breath when lying flat:    Irregular heart rhythm:        Vascular    Pain in calf, thigh, or hip brought on by ambulation: X   Pain in feet at night that wakes you up from your sleep:  X   Blood clot in your veins:    Leg swelling:  X       Pulmonary    Oxygen at home:    Productive cough:     Wheezing:         Neurologic    Sudden weakness in arms or legs:  X   Sudden numbness in  arms or legs:     Sudden onset of difficulty speaking or slurred speech:    Temporary loss of vision in one eye:     Problems with dizziness:  X       Gastrointestinal    Blood in stool:     Vomited blood:         Genitourinary    Burning when urinating:     Blood in urine: X       Psychiatric    Major depression:         Hematologic    Bleeding problems:    Problems with blood clotting too easily:        Skin    Rashes or ulcers:        Constitutional    Fever or chills:      PHYSICAL EXAM: Filed Vitals:   08/17/15 1418  BP: 117/64  Pulse: 66  Temp: 97.3 F (36.3 C)  TempSrc: Oral  Resp: 14  Height:  (1.6 m)  Weight: 126 lb (57.153 kg)  SpO2: 95%    GENERAL: The patient is a well-nourished female, in no acute distress. The vital signs are documented above. CARDIAC: There is a regular rate and rhythm.  VASCULAR: Palpable pedal  pulses PULMONARY: There is good air exchange bilaterally without wheezing or rales. ABDOMEN: Soft and non-tender with normal pitched bowel sounds.  MUSCULOSKELETAL: There are no major deformities or cyanosis. NEUROLOGIC: No focal weakness or paresthesias are detected. SKIN: There are no ulcers or rashes noted. PSYCHIATRIC: The patient has a normal affect.  DATA:  I have reviewed her CT scan which shows compression of the proximal celiac artery, possibly consistent with median arcuate ligament syndrome.  MEDICAL ISSUES: I discussed with the patient that her superior mesenteric artery and inferior mesenteric artery are widely patent despite a stenotic celiac.  I think it is unlikely that she has chronic mesenteric ischemia.  Potentially, however this could be median arcuate ligament syndrome.  I discussed that if this were the case, percutaneous treatment does not have a good long-term outcomes without release of the median arcuate ligament which would be a intra-abdominal operation.  She is not interested in an operation.  I propose several options.  The first of be to proceed with angiography to determine if the median arcuate ligament is compressing the celiac artery, so that this would not be a good lesion to stent.  If there is a stenosis within the celiac artery that would be amenable to stenting, it would be done at that time and could potentially alleviate her symptoms.  If the patient wishes not to proceed with angiography, consideration for injection or ablation of the nerve tissue around the celiac artery could potentially be beneficial.  The patient is going to discuss this with her daughter was unable to be the visit today and then contact me.   Durene Cal [facility] [employer]: [phone]

## 2015-09-08 ENCOUNTER — Telehealth: Payer: Self-pay | Admitting: Orthopaedic Surgery

## 2015-09-08 MED ORDER — HYDROCODONE-ACETAMINOPHEN 5-325 MG PO TABS: 1.0000 | ORAL_TABLET | ORAL | Status: DC | PRN

## 2015-09-08 NOTE — Telephone Encounter (Signed)
Patient called and requested a refill on Norco 5-325 mgs.    Sig: Take 1 tablet by mouth every 4 (four) hours as needed for moderate pain (Must last 30 days.  Do not take and drive a car or use machinery.). °

## 2015-09-08 NOTE — Telephone Encounter (Signed)
Rx done. 

## 2015-09-20 IMAGING — DX DG HIP (WITH OR WITHOUT PELVIS) 2-3V*R*
3 series · 3 of 3 positions shown · non-contrast
Comparison: Right hip series with pelvis of June 18, 2014

CLINICAL DATA: History of right hip fracture treated with ORIF in
June 2014

EXAM:
RIGHT HIP (WITH PELVIS) 2-3 VIEWS

[pelvis ap]
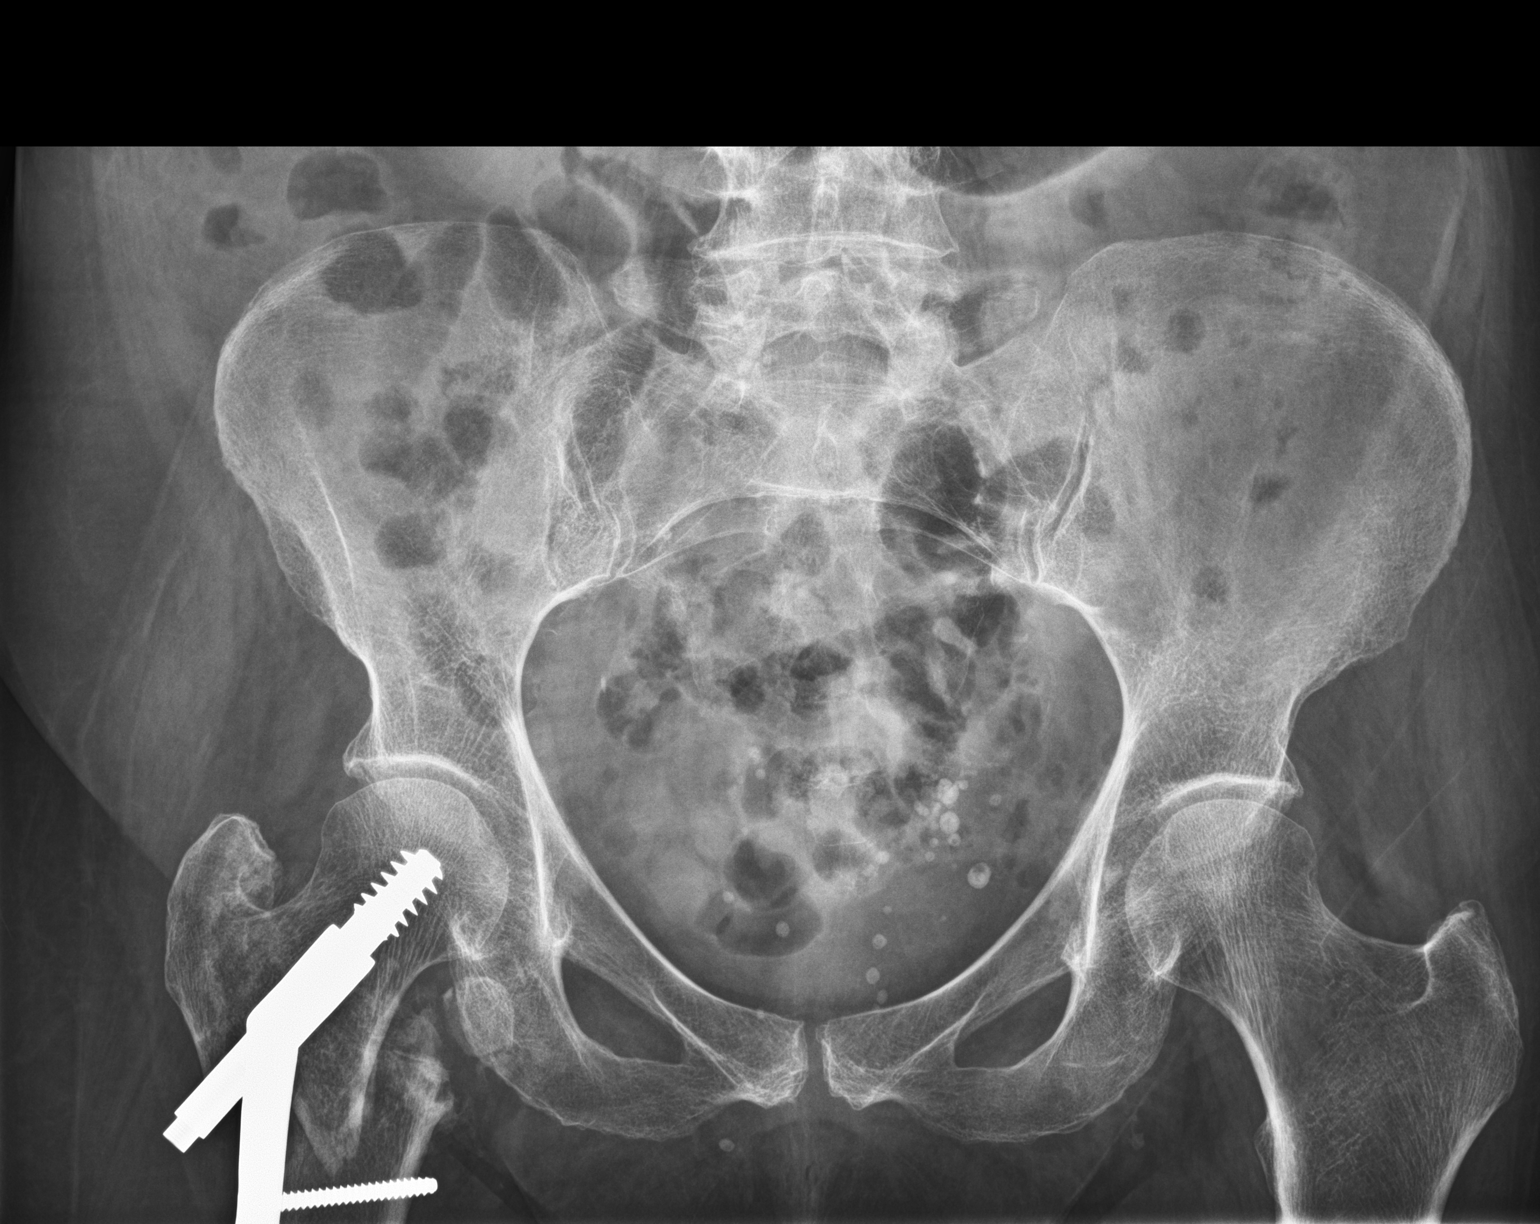

[hip ap]
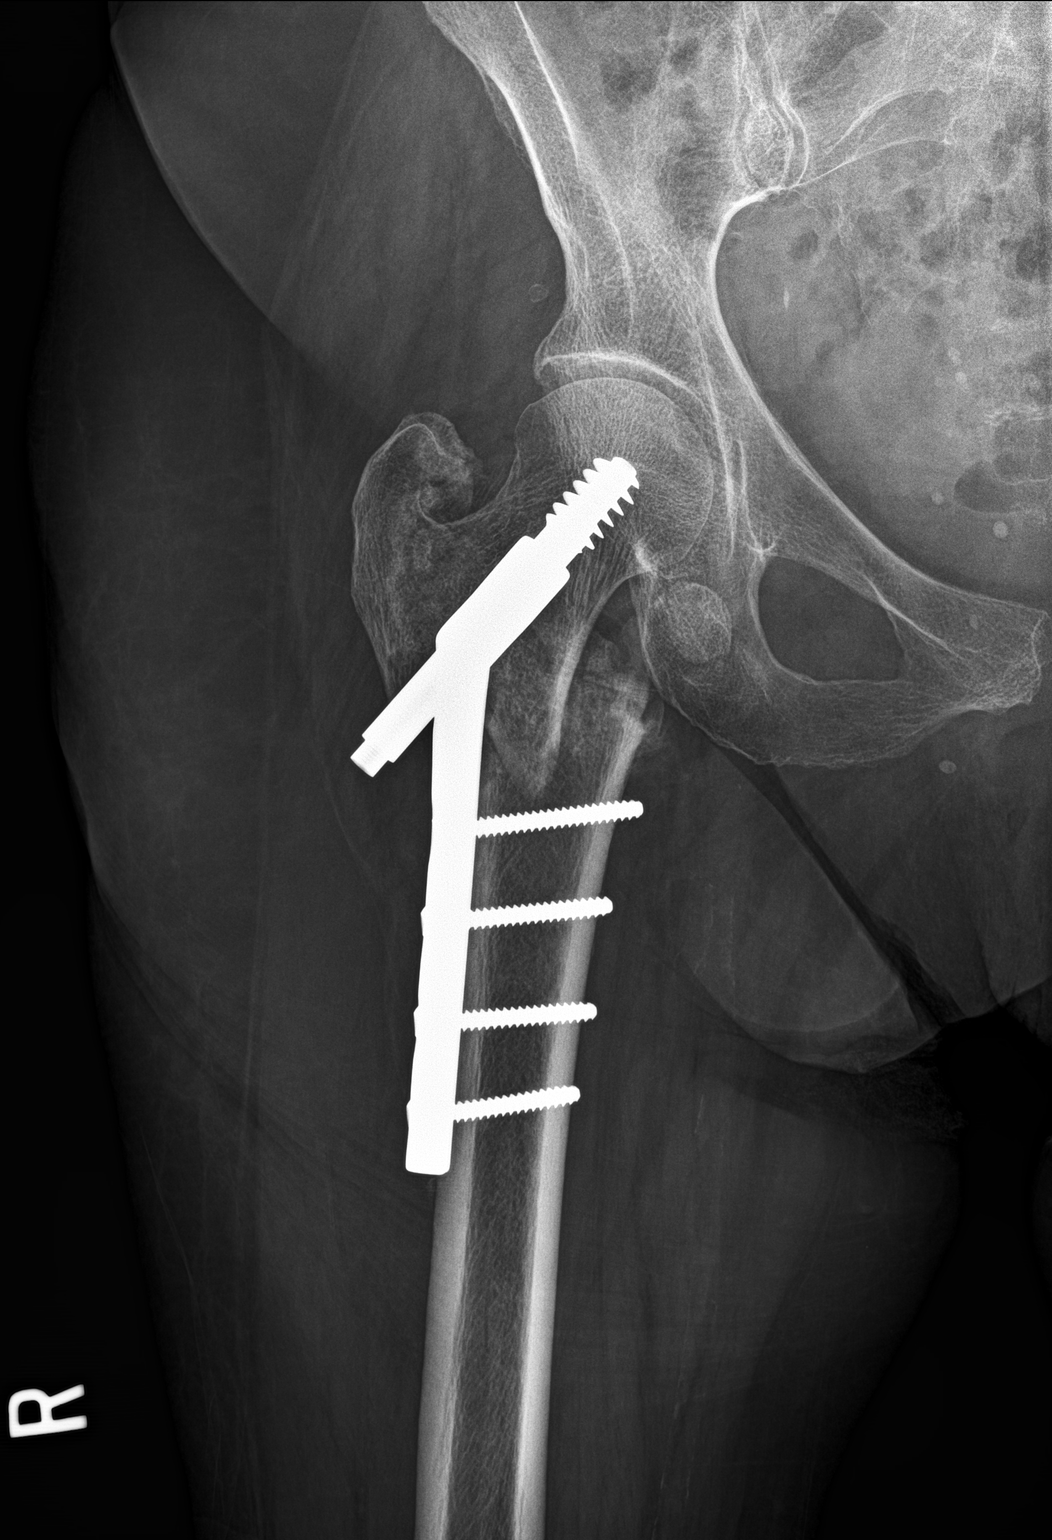

[hip lat]
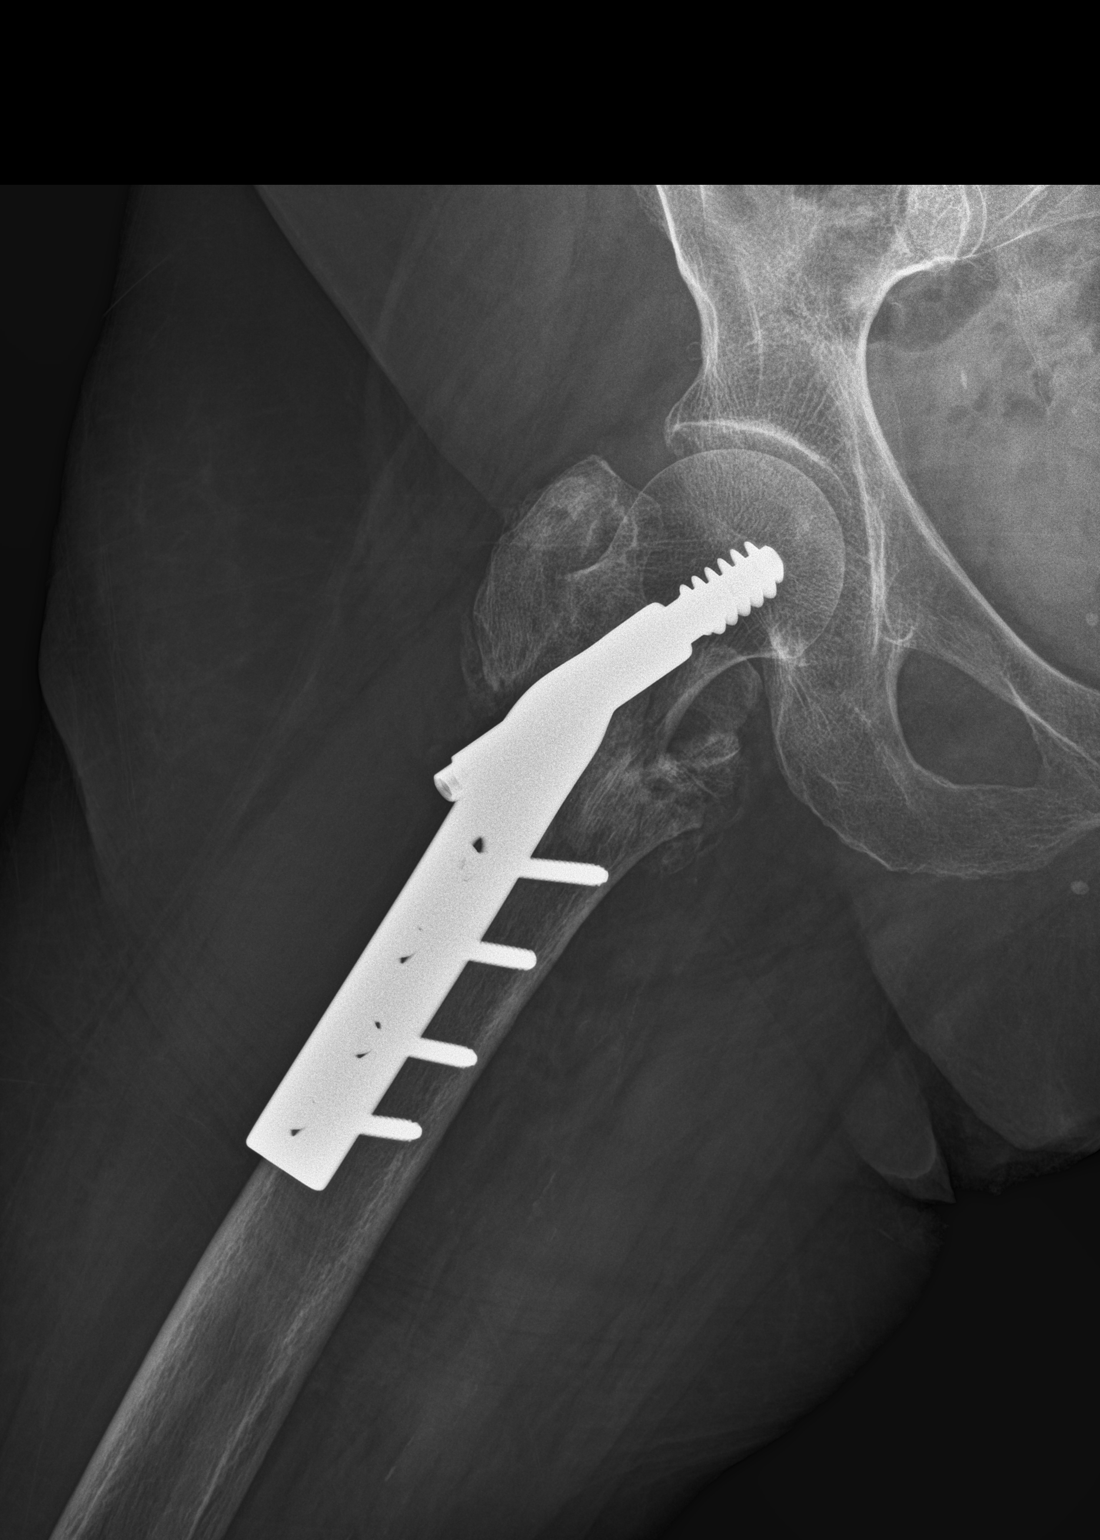

[3 of 3 positions shown; findings below may reference images not displayed]

FINDINGS: The sub trochanteric-intertrochanteric fracture of the right hip is
again demonstrated. There is persistent displacement unchanged from
the immediate post surgical images. A small amount of periosteal
reaction is visible. The metallic sideplate in telescoping screw are
unchanged in position. The bony pelvis is osteopenic. There is no
acute or healing pelvic fracture.
IMPRESSION: There is periosteal reaction visible around the fracture site of the
right femur. The fixation hardware appears intact with no evidence
of loosening. The left hip is intact.

## 2015-09-22 ENCOUNTER — Ambulatory Visit: Payer: Medicare Other | Admitting: Gastroenterology

## 2015-10-25 ENCOUNTER — Telehealth: Payer: Self-pay | Admitting: Orthopaedic Surgery

## 2015-10-25 MED ORDER — HYDROCODONE-ACETAMINOPHEN 5-325 MG PO TABS: 1.0000 | ORAL_TABLET | ORAL | Status: DC | PRN

## 2015-10-25 NOTE — Telephone Encounter (Signed)
Rx done. 

## 2015-10-25 NOTE — Telephone Encounter (Signed)
Patient called and requested a refill on Hydrocodone-Acetaminophen 5-325 mgs.  (Norco)  5-325 mgs.  Qty  120   Sig: Take 1 tablet by mouth every 4 (four) hours as needed for moderate pain (Must last 30 days. Do not take and drive a car or use machinery.).

## 2015-12-09 ENCOUNTER — Encounter: Payer: Self-pay | Admitting: Orthopaedic Surgery

## 2015-12-09 ENCOUNTER — Ambulatory Visit (INDEPENDENT_AMBULATORY_CARE_PROVIDER_SITE_OTHER): Payer: Medicare Other | Admitting: Orthopaedic Surgery

## 2015-12-09 VITALS — BP 134/68 | HR 75 | Temp 97.9°F | Ht 63.0 in | Wt 124.0 lb

## 2015-12-09 DIAGNOSIS — M25511 Pain in right shoulder: Secondary | ICD-10-CM | POA: Diagnosis not present

## 2015-12-09 MED ORDER — TRAMADOL HCL 50 MG PO TABS: 50.0000 mg | ORAL_TABLET | Freq: Four times a day (QID) | ORAL | 3 refills | Status: DC | PRN

## 2015-12-09 NOTE — Progress Notes (Signed)
Patient ON:GEXB:Alexandra Riley, female DOB:Oct 15, 1921, 82 y.o. MWU:[id number]  Chief Complaint  Patient presents with  . New Patient (Initial Visit)    Right Shoulder    HPI  Alexandra Riley is a 82 y.o. female who has right shoulder pain that has developed.  She has pain with overhead use.  She has no paresthesias.  She has no trauma.  She has long history of right hip pain that is stable now. HPI  Body mass index is 21.97 kg/m.  ROS  Review of Systems  Respiratory: Positive for shortness of breath.   Gastrointestinal: Positive for constipation.  Musculoskeletal: Positive for gait problem (needs walker.  History of falls.  Workup negative in past for cause of falls.).  Neurological: Positive for syncope and headaches.    Past Medical History:  Diagnosis Date  . Constipation   . Glaucoma   . High cholesterol   . Osteoporosis   . Shortness of breath on exertion     Past Surgical History:  Procedure Laterality Date  . CATARACT EXTRACTION W/PHACO Right 01/20/2013   Procedure: CATARACT EXTRACTION PHACO AND INTRAOCULAR LENS PLACEMENT (IOC);  Surgeon: Gemma PayorKerry Hunt, MD;  Location: [facility];  Service: Ophthalmology;  Laterality: Right;  CDE:  18.27  . CESAREAN SECTION    . EYE SURGERY    . ORIF HIP FRACTURE Right 05/28/2014   Procedure: OPEN REDUCTION INTERNAL FIXATION HIP;  Surgeon: Darreld McleanWayne Ransom Nickson, MD;  Location: [facility];  Service: Orthopedics;  Laterality: Right;  . TONSILLECTOMY      Family History  Problem Relation Age of Onset  . Colon cancer Neg Hx     Social History Social History  Substance Use Topics  . Smoking status: Never Smoker  . Smokeless tobacco: Current User    Types: Snuff  . Alcohol use No    No Known Allergies  Current Outpatient Prescriptions  Medication Sig Dispense Refill  . aspirin EC 81 MG tablet Take 81 mg by mouth every morning.    Marland Kitchen. atorvastatin (LIPITOR) 20 MG tablet     . clonazePAM (KLONOPIN) 0.5 MG tablet Take 0.5 mg by mouth at bedtime.  *May take one to two tablets daily as needed For anxiety    . mirtazapine (REMERON) 15 MG tablet     . olopatadine (PATANOL) 0.1 % ophthalmic solution     . ondansetron (ZOFRAN) 4 MG tablet     . estradiol (ESTRACE VAGINAL) 0.1 MG/GM vaginal cream Place 0.5 Applicatorfuls vaginally 3 (three) times a week. (Patient not taking: Reported on 12/09/2015) 42.5 g 2  . traMADol (ULTRAM) 50 MG tablet Take 1 tablet (50 mg total) by mouth every 6 (six) hours as needed. 60 tablet 3   No current facility-administered medications for this visit.      Physical Exam  Blood pressure 134/68, pulse 75, temperature 97.9 F (36.6 C), height 5\' 3"  (1.6 m), weight 124 lb (56.2 kg).  Constitutional: overall normal hygiene, normal nutrition, well developed, normal grooming, normal body habitus. Assistive device:none  Musculoskeletal: gait and station Limp none, muscle tone and strength are normal, no tremors or atrophy is present.  .  Neurological: coordination overall normal.  Deep tendon reflex/nerve stretch intact.  Sensation normal.  Cranial nerves II-XII intact.   Skin:   normal overall no scars, lesions, ulcers or rashes. No psoriasis.  Psychiatric: Alert and oriented x 3.  Recent memory intact, remote memory unclear.  Normal mood and affect. Well groomed.  Good eye contact.  Cardiovascular: overall no  swelling, no varicosities, no edema bilaterally, normal temperatures of the legs and arms, no clubbing, cyanosis and good capillary refill.  Lymphatic: palpation is normal.  Examination of right Upper Extremity is done.  Inspection:   Overall:  Elbow non-tender without crepitus or defects, forearm non-tender without crepitus or defects, wrist non-tender without crepitus or defects, hand non-tender.    Shoulder: with glenohumeral joint tenderness, without effusion.   Upper arm: without swelling and tenderness   Range of motion:   Overall:  Full range of motion of the elbow, full range of motion of  wrist and full range of motion in fingers.   Shoulder:  right  140 degrees forward flexion; 100 degrees abduction; 30 degrees internal rotation, 30 degrees external rotation, 10 degrees extension, 35 degrees adduction.   Stability:   Overall:  Shoulder, elbow and wrist stable   Strength and Tone:   Overall full shoulder muscles strength, full upper arm strength and normal upper arm bulk and tone.  PROCEDURE NOTE:  The patient request injection, verbal consent was obtained.  The right shoulder was prepped appropriately after time out was performed.   Sterile technique was observed and injection of 1 cc of Depo-Medrol 40 mg with several cc's of plain xylocaine. Anesthesia was provided by ethyl chloride and a 20-gauge needle was used to inject the shoulder area. A posterior approach was used.  The injection was tolerated well.  A band aid dressing was applied.  The patient was advised to apply ice later today and tomorrow to the injection sight as needed. The patient has been educated about the nature of the problem(s) and counseled on treatment options.  The patient appeared to understand what I have discussed and is in agreement with it.  Encounter Diagnosis  Name Primary?  . Shoulder pain, right Yes    PLAN Call if any problems.  Precautions discussed.  Continue current medications.   Return to clinic 1 month   Electronically Signed Darreld Mclean, MD 8/10/20172:36 PM

## 2015-12-21 ENCOUNTER — Encounter: Payer: Self-pay | Admitting: Orthopaedic Surgery

## 2015-12-21 ENCOUNTER — Ambulatory Visit (INDEPENDENT_AMBULATORY_CARE_PROVIDER_SITE_OTHER): Payer: Medicare Other | Admitting: Orthopaedic Surgery

## 2015-12-21 VITALS — BP 113/61 | HR 75 | Temp 97.5°F | Ht 63.0 in | Wt 120.0 lb

## 2015-12-21 DIAGNOSIS — M25511 Pain in right shoulder: Secondary | ICD-10-CM | POA: Diagnosis not present

## 2015-12-21 MED ORDER — HYDROCODONE-ACETAMINOPHEN 7.5-325 MG PO TABS: ORAL_TABLET | ORAL | 0 refills | Status: DC

## 2015-12-21 NOTE — Progress Notes (Signed)
Patient Alexandra Riley, female DOB:Dec 11, 1921, 82 y.o. [id number]  Chief Complaint  Patient presents with  . Follow-up    RIGHT SHOULDER PAIN    HPI  Alexandra Riley is a 82 y.o. female who has chronic right shoulder pain.  The Tramadol did not help.  The Norco 5 did not help.  She has been on Norco 7.5 in the past.  I have been reluctant to keep her on that but I will give as that is the only thing that has helped over time.  She has no new trauma.  She has no numbness.  She said the injection last time did not help. HPI  Body mass index is 21.26 kg/m.  ROS  Review of Systems  Respiratory: Positive for shortness of breath.   Gastrointestinal: Positive for constipation.  Musculoskeletal: Positive for gait problem (needs walker.  History of falls.  Workup negative in past for cause of falls.).  Neurological: Positive for syncope and headaches.    Past Medical History:  Diagnosis Date  . Constipation   . Glaucoma   . High cholesterol   . Osteoporosis   . Shortness of breath on exertion     Past Surgical History:  Procedure Laterality Date  . CATARACT EXTRACTION W/PHACO Right 01/20/2013   Procedure: CATARACT EXTRACTION PHACO AND INTRAOCULAR LENS PLACEMENT (IOC);  Surgeon: Gemma PayorKerry Hunt, MD;  Location: [facility];  Service: Ophthalmology;  Laterality: Right;  CDE:  18.27  . CESAREAN SECTION    . EYE SURGERY    . ORIF HIP FRACTURE Right 05/28/2014   Procedure: OPEN REDUCTION INTERNAL FIXATION HIP;  Surgeon: Darreld McleanWayne Kyleigh Nannini, MD;  Location: [facility];  Service: Orthopedics;  Laterality: Right;  . TONSILLECTOMY      Family History  Problem Relation Age of Onset  . Colon cancer Neg Hx     Social History Social History  Substance Use Topics  . Smoking status: Never Smoker  . Smokeless tobacco: Current User    Types: Snuff  . Alcohol use No    No Known Allergies  Current Outpatient Prescriptions  Medication Sig Dispense Refill  . aspirin EC 81 MG tablet Take 81 mg by  mouth every morning.    Marland Kitchen. atorvastatin (LIPITOR) 20 MG tablet     . clonazePAM (KLONOPIN) 0.5 MG tablet Take 0.5 mg by mouth at bedtime. *May take one to two tablets daily as needed For anxiety    . estradiol (ESTRACE VAGINAL) 0.1 MG/GM vaginal cream Place 0.5 Applicatorfuls vaginally 3 (three) times a week. 42.5 g 2  . mirtazapine (REMERON) 15 MG tablet     . olopatadine (PATANOL) 0.1 % ophthalmic solution     . ondansetron (ZOFRAN) 4 MG tablet     . traMADol (ULTRAM) 50 MG tablet Take 1 tablet (50 mg total) by mouth every 6 (six) hours as needed. 60 tablet 3  . HYDROcodone-acetaminophen (NORCO) 7.5-325 MG tablet One every four hours for pain as needed.  Do not drive car or operate machinery while taking this medicine.  Must last 14 days. 56 tablet 0   No current facility-administered medications for this visit.      Physical Exam  Blood pressure 113/61, pulse 75, temperature 97.5 F (36.4 C), height 5\' 3"  (1.6 m), weight 120 lb (54.4 kg).  Constitutional: overall normal hygiene, normal nutrition, well developed, normal grooming, normal body habitus. Assistive device:walker  Musculoskeletal: gait and station Limp none, muscle tone and strength are normal, no tremors or atrophy is present.  .Marland Kitchen  Neurological: coordination overall normal.  Deep tendon reflex/nerve stretch intact.  Sensation normal.  Cranial nerves II-XII intact.   Skin:   normal overall no scars, lesions, ulcers or rashes. No psoriasis.  Psychiatric: Alert and oriented x 3.  Recent memory intact, remote memory unclear.  Normal mood and affect. Well groomed.  Good eye contact.  Cardiovascular: overall no swelling, no varicosities, no edema bilaterally, normal temperatures of the legs and arms, no clubbing, cyanosis and good capillary refill.  Lymphatic: palpation is normal.  Examination of right Upper Extremity is done.  Inspection:   Overall:  Elbow non-tender without crepitus or defects, forearm non-tender without  crepitus or defects, wrist non-tender without crepitus or defects, hand non-tender.    Shoulder: with glenohumeral joint tenderness, without effusion.   Upper arm: without swelling and tenderness   Range of motion:   Overall:  Full range of motion of the elbow, full range of motion of wrist and full range of motion in fingers.   Shoulder:  right  145 degrees forward flexion; 110 degrees abduction; 25 degrees internal rotation, 25 degrees external rotation, 10 degrees extension, 40 degrees adduction.   Stability:   Overall:  Shoulder, elbow and wrist stable   Strength and Tone:   Overall full shoulder muscles strength, full upper arm strength and normal upper arm bulk and tone.   The patient has been educated about the nature of the problem(s) and counseled on treatment options.  The patient appeared to understand what I have discussed and is in agreement with it.  Encounter Diagnosis  Name Primary?  . Shoulder pain, right Yes   She called me back in the room and asked for injection.  PROCEDURE NOTE:  The patient request injection, verbal consent was obtained.  The right shoulder was prepped appropriately after time out was performed.   Sterile technique was observed and injection of 1 cc of Depo-Medrol 40 mg with several cc's of plain xylocaine. Anesthesia was provided by ethyl chloride and a 20-gauge needle was used to inject the shoulder area. A posterior approach was used.  The injection was tolerated well.  A band aid dressing was applied.  The patient was advised to apply ice later today and tomorrow to the injection sight as needed. PLAN Call if any problems.  Precautions discussed.  Continue current medications.   Return to clinic 1 month   Electronically Signed Darreld McleanWayne Vahan Wadsworth, MD 8/22/20173:08 PM

## 2016-01-11 ENCOUNTER — Telehealth: Payer: Self-pay | Admitting: Orthopaedic Surgery

## 2016-01-11 MED ORDER — HYDROCODONE-ACETAMINOPHEN 7.5-325 MG PO TABS: ORAL_TABLET | ORAL | 0 refills | Status: DC

## 2016-01-11 NOTE — Telephone Encounter (Signed)
Patient requests a refill on Hydrocodone/Acetaminophen (Norco)  7.5-325 mgs.  Qty 56       Sig: One every four hours for pain as needed. Do not drive car or operate machinery while taking this medicine. Must last 14 days.

## 2016-01-18 ENCOUNTER — Ambulatory Visit: Payer: Medicare Other | Admitting: Orthopaedic Surgery

## 2016-01-25 ENCOUNTER — Ambulatory Visit (INDEPENDENT_AMBULATORY_CARE_PROVIDER_SITE_OTHER): Payer: Medicare Other | Admitting: Orthopaedic Surgery

## 2016-01-25 ENCOUNTER — Encounter: Payer: Self-pay | Admitting: Orthopaedic Surgery

## 2016-01-25 VITALS — BP 111/69 | HR 70 | Temp 97.7°F | Ht 63.0 in | Wt 118.4 lb

## 2016-01-25 DIAGNOSIS — M25511 Pain in right shoulder: Secondary | ICD-10-CM

## 2016-01-25 MED ORDER — HYDROCODONE-ACETAMINOPHEN 7.5-325 MG PO TABS: ORAL_TABLET | ORAL | 0 refills | Status: DC

## 2016-01-25 NOTE — Progress Notes (Signed)
Patient Alexandra Riley, female DOB:03-Mar-1922, 82 y.o. [id number]  Chief Complaint  Patient presents with  . Follow-up    Right shoulder    HPI  Alexandra Riley is a 82 y.o. female who has chronic pain of the right shoulder. She is doing her exercises and has no new trauma, no paresthesias.  Her pain medicine is working well. HPI  Body mass index is 20.97 kg/m.  ROS  Review of Systems  Respiratory: Positive for shortness of breath.   Gastrointestinal: Positive for constipation.  Musculoskeletal: Positive for gait problem (needs walker.  History of falls.  Workup negative in past for cause of falls.).  Neurological: Positive for syncope and headaches.    Past Medical History:  Diagnosis Date  . Constipation   . Glaucoma   . High cholesterol   . Osteoporosis   . Shortness of breath on exertion     Past Surgical History:  Procedure Laterality Date  . CATARACT EXTRACTION W/PHACO Right 01/20/2013   Procedure: CATARACT EXTRACTION PHACO AND INTRAOCULAR LENS PLACEMENT (IOC);  Surgeon: Gemma Payor, MD;  Location: [facility];  Service: Ophthalmology;  Laterality: Right;  CDE:  18.27  . CESAREAN SECTION    . EYE SURGERY    . ORIF HIP FRACTURE Right 05/28/2014   Procedure: OPEN REDUCTION INTERNAL FIXATION HIP;  Surgeon: Darreld Mclean, MD;  Location: [facility];  Service: Orthopedics;  Laterality: Right;  . TONSILLECTOMY      Family History  Problem Relation Age of Onset  . Colon cancer Neg Hx     Social History Social History  Substance Use Topics  . Smoking status: Never Smoker  . Smokeless tobacco: Current User    Types: Snuff  . Alcohol use No    No Known Allergies  Current Outpatient Prescriptions  Medication Sig Dispense Refill  . aspirin EC 81 MG tablet Take 81 mg by mouth every morning.    Marland Kitchen atorvastatin (LIPITOR) 20 MG tablet     . clonazePAM (KLONOPIN) 0.5 MG tablet Take 0.5 mg by mouth at bedtime. *May take one to two tablets daily as needed For anxiety     . estradiol (ESTRACE VAGINAL) 0.1 MG/GM vaginal cream Place 0.5 Applicatorfuls vaginally 3 (three) times a week. 42.5 g 2  . HYDROcodone-acetaminophen (NORCO) 7.5-325 MG tablet One every six hours for pain as needed.  Do not drive car or operate machinery while taking this medicine.  Must last 14 days. 50 tablet 0  . mirtazapine (REMERON) 15 MG tablet     . olopatadine (PATANOL) 0.1 % ophthalmic solution     . ondansetron (ZOFRAN) 4 MG tablet     . traMADol (ULTRAM) 50 MG tablet Take 1 tablet (50 mg total) by mouth every 6 (six) hours as needed. 60 tablet 3   No current facility-administered medications for this visit.      Physical Exam  Blood pressure 111/69, pulse 70, temperature 97.7 F (36.5 C), height 5\' 3"  (1.6 m), weight 118 lb 6.4 oz (53.7 kg).  Constitutional: overall normal hygiene, normal nutrition, well developed, normal grooming, normal body habitus. Assistive device:none  Musculoskeletal: gait and station Limp none, muscle tone and strength are normal, no tremors or atrophy is present.  .  Neurological: coordination overall normal.  Deep tendon reflex/nerve stretch intact.  Sensation normal.  Cranial nerves II-XII intact.   Skin:   Normal overall no scars, lesions, ulcers or rashes. No psoriasis.  Psychiatric: Alert and oriented x 3.  Recent memory intact, remote  memory unclear.  Normal mood and affect. Well groomed.  Good eye contact.  Cardiovascular: overall no swelling, no varicosities, no edema bilaterally, normal temperatures of the legs and arms, no clubbing, cyanosis and good capillary refill.  Lymphatic: palpation is normal.  Examination of right Upper Extremity is done.  Inspection:   Overall:  Elbow non-tender without crepitus or defects, forearm non-tender without crepitus or defects, wrist non-tender without crepitus or defects, hand non-tender.    Shoulder: with glenohumeral joint tenderness, without effusion.   Upper arm: without swelling and  tenderness   Range of motion:   Overall:  Full range of motion of the elbow, full range of motion of wrist and full range of motion in fingers.   Shoulder:  right  165 degrees forward flexion; 145 degrees abduction; 30 degrees internal rotation, 30 degrees external rotation, 15 degrees extension, 40 degrees adduction.   Stability:   Overall:  Shoulder, elbow and wrist stable   Strength and Tone:   Overall full shoulder muscles strength, full upper arm strength and normal upper arm bulk and tone.   The patient has been educated about the nature of the problem(s) and counseled on treatment options.  The patient appeared to understand what I have discussed and is in agreement with it.  Encounter Diagnosis  Name Primary?  . Shoulder pain, right Yes    PLAN Call if any problems.  Precautions discussed.  Continue current medications.   Return to clinic 2 months   Electronically Signed Darreld McleanWayne Sibley Rolison, MD 9/26/20173:00 PM

## 2016-02-23 ENCOUNTER — Ambulatory Visit: Payer: Medicare Other | Admitting: Orthopaedic Surgery

## 2016-02-23 ENCOUNTER — Telehealth: Payer: Self-pay | Admitting: Orthopaedic Surgery

## 2016-02-23 MED ORDER — HYDROCODONE-ACETAMINOPHEN 7.5-325 MG PO TABS: ORAL_TABLET | ORAL | 0 refills | Status: DC

## 2016-02-23 NOTE — Telephone Encounter (Signed)
Hydrocodone-Acetaminophen  7.5/325 mg  Qty 50 Tablets °

## 2016-02-24 ENCOUNTER — Encounter: Payer: Self-pay | Admitting: Orthopaedic Surgery

## 2016-02-24 ENCOUNTER — Ambulatory Visit (INDEPENDENT_AMBULATORY_CARE_PROVIDER_SITE_OTHER): Payer: Medicare Other | Admitting: Orthopaedic Surgery

## 2016-02-24 VITALS — BP 117/63 | HR 78 | Temp 97.5°F | Ht 63.0 in | Wt 117.0 lb

## 2016-02-24 DIAGNOSIS — M25511 Pain in right shoulder: Secondary | ICD-10-CM

## 2016-02-24 DIAGNOSIS — G8929 Other chronic pain: Secondary | ICD-10-CM

## 2016-02-24 NOTE — Progress Notes (Signed)
CC:  My right shoulder hurts  She has pain of the right shoulder and decreased motion.  She has little help with exercises.  She has no new trauma.  NV intact.  Encounter Diagnosis  Name Primary?  . Chronic right shoulder pain Yes   PROCEDURE NOTE:  The patient request injection, verbal consent was obtained.  The right shoulder was prepped appropriately after time out was performed.   Sterile technique was observed and injection of 1 cc of Depo-Medrol 40 mg with several cc's of plain xylocaine. Anesthesia was provided by ethyl chloride and a 20-gauge needle was used to inject the shoulder area. A posterior approach was used.  The injection was tolerated well.  A band aid dressing was applied.  The patient was advised to apply ice later today and tomorrow to the injection sight as needed.  Return in two months.  Call if any problem.  Electronically Signed Darreld McleanWayne Redonna Wilbert, MD 10/26/20171:59 PM

## 2016-03-20 ENCOUNTER — Telehealth: Payer: Self-pay | Admitting: Orthopaedic Surgery

## 2016-03-20 MED ORDER — HYDROCODONE-ACETAMINOPHEN 7.5-325 MG PO TABS: ORAL_TABLET | ORAL | 0 refills | Status: DC

## 2016-03-20 NOTE — Telephone Encounter (Signed)
Patient requests for Hydrocodone/Acetaminophen (Norco)  7.5-325 mgs.  Qty  45   Sig: One every six hours for pain as needed. Do not drive car or operate machinery while taking this medicine. Must last 14 days.

## 2016-03-23 ENCOUNTER — Encounter (HOSPITAL_COMMUNITY): Payer: Self-pay | Admitting: Emergency Medicine

## 2016-03-23 ENCOUNTER — Emergency Department (HOSPITAL_COMMUNITY)
Admission: EM | Admit: 2016-03-23 | Discharge: 2016-03-23 | Disposition: A | Discharge status: Home / Self Care | Payer: Medicare Other | Attending: Emergency Medicine | Admitting: Emergency Medicine

## 2016-03-23 ENCOUNTER — Emergency Department (HOSPITAL_COMMUNITY): Payer: Medicare Other

## 2016-03-23 DIAGNOSIS — Z79899 Other long term (current) drug therapy: Secondary | ICD-10-CM | POA: Diagnosis not present

## 2016-03-23 DIAGNOSIS — J029 Acute pharyngitis, unspecified: Secondary | ICD-10-CM | POA: Diagnosis not present

## 2016-03-23 DIAGNOSIS — R05 Cough: Secondary | ICD-10-CM | POA: Insufficient documentation

## 2016-03-23 DIAGNOSIS — Z7982 Long term (current) use of aspirin: Secondary | ICD-10-CM | POA: Insufficient documentation

## 2016-03-23 DIAGNOSIS — F17290 Nicotine dependence, other tobacco product, uncomplicated: Secondary | ICD-10-CM | POA: Diagnosis not present

## 2016-03-23 DIAGNOSIS — R34 Anuria and oliguria: Secondary | ICD-10-CM | POA: Insufficient documentation

## 2016-03-23 DIAGNOSIS — R0602 Shortness of breath: Secondary | ICD-10-CM | POA: Diagnosis not present

## 2016-03-23 DIAGNOSIS — R531 Weakness: Secondary | ICD-10-CM | POA: Diagnosis present

## 2016-03-23 LAB — CBC
HCT: 37.3 % (ref 36.0–46.0)
HEMOGLOBIN: 11.7 g/dL — AB (ref 12.0–15.0)
MCH: 29.4 pg (ref 26.0–34.0)
MCHC: 31.4 g/dL (ref 30.0–36.0)
MCV: 93.7 fL (ref 78.0–100.0)
Platelets: 184 10*3/uL (ref 150–400)
RBC: 3.98 MIL/uL (ref 3.87–5.11)
RDW: 14.2 % (ref 11.5–15.5)
WBC: 9.2 10*3/uL (ref 4.0–10.5)

## 2016-03-23 LAB — URINALYSIS, ROUTINE W REFLEX MICROSCOPIC
Bilirubin Urine: NEGATIVE
Glucose, UA: NEGATIVE mg/dL
Ketones, ur: NEGATIVE mg/dL
LEUKOCYTES UA: NEGATIVE
NITRITE: NEGATIVE
Protein, ur: NEGATIVE mg/dL
pH: 5.5 (ref 5.0–8.0)

## 2016-03-23 LAB — URINE MICROSCOPIC-ADD ON
Bacteria, UA: NONE SEEN
WBC UA: NONE SEEN WBC/hpf (ref 0–5)

## 2016-03-23 LAB — BASIC METABOLIC PANEL
ANION GAP: 13 (ref 5–15)
BUN: 12 mg/dL (ref 6–20)
CALCIUM: 9.4 mg/dL (ref 8.9–10.3)
CO2: 18 mmol/L — AB (ref 22–32)
Chloride: 114 mmol/L — ABNORMAL HIGH (ref 101–111)
Creatinine, Ser: 0.96 mg/dL (ref 0.44–1.00)
GFR, EST AFRICAN AMERICAN: 58 mL/min — AB (ref >60)
GFR, EST NON AFRICAN AMERICAN: 50 mL/min — AB (ref >60)
Glucose, Bld: 67 mg/dL (ref 65–99)
Potassium: 3.6 mmol/L (ref 3.5–5.1)
Sodium: 145 mmol/L (ref 135–145)

## 2016-03-23 LAB — TROPONIN I: TROPONIN I: 0.04 ng/mL — AB (ref <0.03)

## 2016-03-23 LAB — TSH: TSH: 1.337 u[IU]/mL (ref 0.350–4.500)

## 2016-03-23 MED ORDER — HYDROGEN PEROXIDE 3 % EX SOLN
CUTANEOUS | Status: AC
  Filled 2016-03-23: qty 473

## 2016-03-23 MED ORDER — SODIUM CHLORIDE 0.9 % IV BOLUS (SEPSIS)
1000.0000 mL | Freq: Once | INTRAVENOUS | Status: AC
  Administered 2016-03-23: 1000 mL via INTRAVENOUS

## 2016-03-23 NOTE — ED Notes (Signed)
CRITICAL VALUE ALERT  Critical value received:  Trop. 0.04  Date of notification:  03/23/2016  Time of notification:  1619  Critical value read back:yes   Nurse who received alert:  Rory PercyS. Caydence Koenig RN  MD notified (1st page):  PA Espina  Time of first page:  1620  MD notified (2nd page):  Time of second page:  Responding MD:  Gwynneth AlbrightPa Espina   Time MD responded:  1620

## 2016-03-23 NOTE — ED Provider Notes (Signed)
[facility] Provider Note   CSN: [id number] Arrival date & time: 03/23/16  1414     History   Chief Complaint Chief Complaint  Patient presents with  . Weakness    HPI [name] is a 82 y.o. female with PMH of glaucoma and osteoporosis presenting with weakness and failure to thrive for about a month. She lives with her son. The patient states that she does not eat or drink enough liquids. The patient's daughter states that her not eating or drinking enough liquid is something that is not new. She has had recent nonproductive cough for about 3 days.  She admits to frequent falls. She denies any new medications. She denies any LOC, confusion, fever, chills, nausea, vomiting, chest pain, swelling of legs, changes in bowel movements, or urinary changes. Son and daughter are concerned for her failure to thrive.   The history is provided by the patient and a relative (son and daughter).    Past Medical History:  Diagnosis Date  . Constipation   . Glaucoma   . High cholesterol   . Osteoporosis   . Shortness of breath on exertion     Patient Active Problem List   Diagnosis Date Noted  . Abdominal pain 07/05/2015  . Hematometra 06/24/2015  . Postmenopausal vaginal bleeding 06/24/2015  . Hypokalemia 11/11/2014  . Recurrent falls 11/11/2014  . Rhabdomyolysis 11/11/2014  . Hip fracture (HCC) 05/26/2014    Past Surgical History:  Procedure Laterality Date  . CATARACT EXTRACTION W/PHACO Right 01/20/2013   Procedure: CATARACT EXTRACTION PHACO AND INTRAOCULAR LENS PLACEMENT (IOC);  Surgeon: Gemma Payor, MD;  Location: [facility];  Service: Ophthalmology;  Laterality: Right;  CDE:  18.27  . CESAREAN SECTION    . EYE SURGERY    . ORIF HIP FRACTURE Right 05/28/2014   Procedure: OPEN REDUCTION INTERNAL FIXATION HIP;  Surgeon: Darreld Mclean, MD;  Location: [facility];  Service: Orthopedics;  Laterality: Right;  . TONSILLECTOMY      OB History    Gravida Para Term Preterm AB Living    4 3 3     3    SAB TAB Ectopic Multiple Live Births                   Home Medications    Prior to Admission medications   Medication Sig Start Date End Date Taking? Authorizing Provider  aspirin EC 81 MG tablet Take 81 mg by mouth every morning.    Historical Provider, MD  atorvastatin (LIPITOR) 20 MG tablet  07/27/15   Historical Provider, MD  clonazePAM (KLONOPIN) 0.5 MG tablet Take 0.5 mg by mouth at bedtime. *May take one to two tablets daily as needed For anxiety    Historical Provider, MD  estradiol (ESTRACE VAGINAL) 0.1 MG/GM vaginal cream Place 0.5 Applicatorfuls vaginally 3 (three) times a week. 06/29/15   Tilda Burrow, MD  HYDROcodone-acetaminophen (NORCO) 7.5-325 MG tablet One every six hours for pain as needed.  Do not drive car or operate machinery while taking this medicine.  Must last 14 days. 03/20/16   Darreld Mclean, MD  mirtazapine (REMERON) 15 MG tablet  10/22/15   Historical Provider, MD  olopatadine (PATANOL) 0.1 % ophthalmic solution  10/11/15   Historical Provider, MD  ondansetron (ZOFRAN) 4 MG tablet  11/22/15   Historical Provider, MD  traMADol (ULTRAM) 50 MG tablet Take 1 tablet (50 mg total) by mouth every 6 (six) hours as needed. 12/09/15   Darreld Mclean, MD  Family History Family History  Problem Relation Age of Onset  . Colon cancer Neg Hx     Social History Social History  Substance Use Topics  . Smoking status: Never Smoker  . Smokeless tobacco: Current User    Types: Snuff  . Alcohol use No     Allergies   Patient has no known allergies.   Review of Systems Review of Systems  Constitutional: Negative for chills and fever.  HENT: Positive for sore throat.   Respiratory: Positive for cough and shortness of breath.   Cardiovascular: Negative for chest pain and leg swelling.  Genitourinary: Positive for decreased urine volume.  All other systems reviewed and are negative.    Physical Exam Updated Vital Signs BP 132/60   Pulse 72    Temp 98 F (36.7 C) (Oral)   Resp 17   Ht 5\' 7"  (1.702 m)   Wt 53.5 kg   SpO2 97%   BMI 18.48 kg/m   Physical Exam  Constitutional: She is oriented to person, place, and time.  HENT:  Head: Normocephalic.  Nose: Nose normal.  Mouth/Throat: Oropharynx is clear and moist. No oropharyngeal exudate.  Eyes: Conjunctivae and EOM are normal. Pupils are equal, round, and reactive to light.  Neck: Normal range of motion.  Cardiovascular: Normal rate.   Pulmonary/Chest: Effort normal and breath sounds normal. No respiratory distress.  Abdominal: Soft. There is no tenderness. There is no rebound and no guarding.  Neurological: She is alert and oriented to person, place, and time.  Skin: Skin is warm.  Psychiatric: She has a normal mood and affect. Her behavior is normal.  Nursing note and vitals reviewed.    ED Treatments / Results  Labs (all labs ordered are listed, but only abnormal results are displayed) Labs Reviewed  BASIC METABOLIC PANEL - Abnormal; Notable for the following:       Result Value   Chloride 114 (*)    CO2 18 (*)    GFR calc non Af Amer 50 (*)    GFR calc Af Amer 58 (*)    All other components within normal limits  CBC - Abnormal; Notable for the following:    Hemoglobin 11.7 (*)    All other components within normal limits  URINALYSIS, ROUTINE W REFLEX MICROSCOPIC (NOT AT [facility]) - Abnormal; Notable for the following:    Specific Gravity, Urine <1.005 (*)    Hgb urine dipstick TRACE (*)    All other components within normal limits  TROPONIN I - Abnormal; Notable for the following:    Troponin I 0.04 (*)    All other components within normal limits  URINE MICROSCOPIC-ADD ON - Abnormal; Notable for the following:    Squamous Epithelial / LPF 0-5 (*)    All other components within normal limits  TSH    EKG  EKG Interpretation  Date/Time:  Thursday March 23 2016 14:22:21 EST Ventricular Rate:  69 PR Interval:    QRS Duration: 124 QT  Interval:  458 QTC Calculation: 491 R Axis:   69 Text Interpretation:  Sinus rhythm Right bundle branch block LVH with IVCD and secondary repol abnrm Borderline prolonged QT interval When compared to prior ECG, similar t wave patterns were seen.  No STEMI Confirmed by [facility] Cent[facility] AscEGELER MD, CHRISTOPHER [phone] on 03/23/2016 2:25:35 PM       Radiology Dg Chest 2 View  Result Date: 03/23/2016 CLINICAL DATA:  Cough EXAM: CHEST  2 VIEW COMPARISON:  04/13/2016 FINDINGS: Normal heart size. Stable  aortic tortuosity. Mitral annular calcification. There is no edema, consolidation, effusion, or pneumothorax. Exaggerated thoracic kyphosis with chronic mid thoracic compression fracture. IMPRESSION: No evidence of acute disease. Electronically Signed   By: Marnee SpringJonathon  Watts M.D.   On: 03/23/2016 16:42    Procedures Procedures (including critical care time)  Medications Ordered in ED Medications  sodium chloride 0.9 % bolus 1,000 mL (0 mLs Intravenous Stopped 03/23/16 1800)     Initial Impression / Assessment and Plan / ED Course  I have reviewed the triage vital signs and the nursing notes.  Pertinent labs & imaging results that were available during my care of the patient were reviewed by me and considered in my medical decision making (see chart for details).  Clinical Course   Pt is a 82 y/o female presenting with weakness for about 1 month. Her son and daughter are concerned about her failure to thrive. She states that she can tolerate food, but says she doesn't have an appetite. Pt complained about a nonproductive cough for about 3 days. Pt denies fever, chills, chest pain, urinary changes, constipation, diarrhea, blood in stool or swelling of her legs. On exam patient clear to auscultation, no obvious heart murmurs, abdomen soft and nontender, oropharynx appears normal, no peripheral edema. Negative neural exam. Pt did have did have some difficulty with balance and ambulating.  Pt given fluids for possible  dehydration. Chest xray shows no evidence of acute disease. Lab work does not show evidence of acute pathology and appears to be consistent with previous findings. Glucose reading is normal and no evidence of dehydration. Plan is to discharge home with strict follow up to her PCP. The pt was also given the option for home health care and therapy to be administered at home. The pt and her son agreed to home health care and therapy. Home health care and therapy ordered. Discharge instructions and return precautions given for any new or worsening symptoms.   Final Clinical Impressions(s) / ED Diagnoses   Final diagnoses:  Weakness    New Prescriptions Discharge Medication List as of 03/23/2016  5:51 PM       [address] [place], [place] 03/23/16 2305    Eber HongBrian Miller, MD 03/24/16 1145

## 2016-03-23 NOTE — Discharge Instructions (Signed)
SEEK IMMEDIATE MEDICAL CARE IF:  You have shortness of breath or chest pain. You have difficulty moving parts of your body. You have weakness in only one area of the body or on only one side of the body. You have a fever. You have trouble speaking or swallowing. You cannot control your bladder or bowel movements. You have black or bloody vomit or stools.

## 2016-03-23 NOTE — ED Provider Notes (Signed)
The patient is a 82 year old female, she has a history of approximately one month of failure to thrive, she continues to say I just feel awful but cannot say exactly why she feels that way, she denies any specific pain other than a mild headache and a sore throat, she has not been eating very well for the last month but when asked what that means the daughter states she will eat coffee for breakfast, sandwich for lunch, bite of a cookie, sandwich and a pudding with some coffee for dinner. The patient denies any urinary symptoms, constipation, diarrhea, blood in the stool, swelling of the legs or focal weakness or numbness. The patient has been coughing occasionally. She has not seen her family doctor for this illness, no new medications. The daughter is concerned that she is still living by herself and having this failure to thrive. There has been occasional falls.   EKG Interpretation  Date/Time:  Thursday March 23 2016 14:22:21 EST Ventricular Rate:  69 PR Interval:    QRS Duration: 124 QT Interval:  458 QTC Calculation: 491 R Axis:   69 Text Interpretation:  Sinus rhythm Right bundle branch block LVH with IVCD and secondary repol abnrm Borderline prolonged QT interval When compared to prior ECG, similar t wave patterns were seen.  No STEMI Confirmed by Rush LandmarkEGELER MD, CHRISTOPHER [phone] on 03/23/2016 2:25:35 PM      Medical screening examination/treatment/procedure(s) were conducted as a shared visit with non-physician practitioner(s) and myself.  I personally evaluated the patient during the encounter.  Clinical Impression:   Final diagnoses:  Weakness         Eber HongBrian Io Dieujuste, MD 03/24/16 1144

## 2016-03-23 NOTE — ED Triage Notes (Signed)
Pt reports sore throat and nonproductive cough with generalized body aches x 3 days.  States she just feels "awful."

## 2016-03-28 ENCOUNTER — Ambulatory Visit: Payer: Medicare Other | Admitting: Orthopaedic Surgery

## 2016-04-03 ENCOUNTER — Encounter: Payer: Self-pay | Admitting: Internal Medicine

## 2016-04-10 ENCOUNTER — Telehealth: Payer: Self-pay | Admitting: Orthopaedic Surgery

## 2016-04-10 NOTE — Telephone Encounter (Signed)
Hydrocodone-Acetaminophen 7.5/325mg  Qty 45 Tablets °

## 2016-04-11 MED ORDER — HYDROCODONE-ACETAMINOPHEN 7.5-325 MG PO TABS: ORAL_TABLET | ORAL | 0 refills | Status: DC

## 2016-04-12 ENCOUNTER — Ambulatory Visit: Payer: Medicare Other | Admitting: Gastroenterology

## 2016-04-19 ENCOUNTER — Telehealth: Payer: Self-pay | Admitting: Orthopaedic Surgery

## 2016-04-19 MED ORDER — HYDROCODONE-ACETAMINOPHEN 7.5-325 MG PO TABS: ORAL_TABLET | ORAL | 0 refills | Status: DC

## 2016-04-19 NOTE — Telephone Encounter (Signed)
Hydrocodone-Acetaminophen 7.5/325mg  Qty 45 Tablets °

## 2016-05-04 ENCOUNTER — Encounter: Payer: Self-pay | Admitting: Orthopaedic Surgery

## 2016-05-04 ENCOUNTER — Ambulatory Visit (INDEPENDENT_AMBULATORY_CARE_PROVIDER_SITE_OTHER): Payer: Medicare Other | Admitting: Orthopaedic Surgery

## 2016-05-04 VITALS — BP 128/67 | HR 72 | Temp 97.0°F | Ht 63.0 in | Wt 115.0 lb

## 2016-05-04 DIAGNOSIS — G8929 Other chronic pain: Secondary | ICD-10-CM | POA: Diagnosis not present

## 2016-05-04 DIAGNOSIS — M25511 Pain in right shoulder: Secondary | ICD-10-CM

## 2016-05-04 MED ORDER — HYDROCODONE-ACETAMINOPHEN 7.5-325 MG PO TABS: ORAL_TABLET | ORAL | 0 refills | Status: DC

## 2016-05-04 NOTE — Progress Notes (Signed)
PROCEDURE NOTE:  The patient request injection, verbal consent was obtained.  The right shoulder was prepped appropriately after time out was performed.   Sterile technique was observed and injection of 1 cc of Depo-Medrol 40 mg with several cc's of plain xylocaine. Anesthesia was provided by ethyl chloride and a 20-gauge needle was used to inject the shoulder area. A posterior approach was used.  The injection was tolerated well.  A band aid dressing was applied.  The patient was advised to apply ice later today and tomorrow to the injection sight as needed.  I will see her in three months.  Rx for pain given after checking state narcotic site.  Electronically Signed Darreld McleanWayne Yeni Jiggetts, MD 1/4/20182:33 PM

## 2016-06-01 ENCOUNTER — Telehealth: Payer: Self-pay | Admitting: Orthopaedic Surgery

## 2016-06-01 NOTE — Telephone Encounter (Signed)
Patient requests a refill on Hydrocodone/Acetaminophen (Norco)  7.5-325  Mgs.   Qty  42        Sig: One every six hours for pain as needed. Do not drive car or operate machinery while taking this medicine. Must last 14 days.

## 2016-06-05 MED ORDER — HYDROCODONE-ACETAMINOPHEN 7.5-325 MG PO TABS: ORAL_TABLET | ORAL | 0 refills | Status: DC

## 2016-06-26 ENCOUNTER — Telehealth: Payer: Self-pay | Admitting: Orthopaedic Surgery

## 2016-06-26 MED ORDER — HYDROCODONE-ACETAMINOPHEN 7.5-325 MG PO TABS: ORAL_TABLET | ORAL | 0 refills | Status: DC

## 2016-06-26 NOTE — Telephone Encounter (Signed)
Patient called for refill:  HYDROcodone-acetaminophen (NORCO) 7.5-325 MG tablet 38 tablet

## 2016-07-05 ENCOUNTER — Encounter: Payer: Self-pay | Admitting: Internal Medicine

## 2016-07-11 ENCOUNTER — Telehealth: Payer: Self-pay | Admitting: Orthopaedic Surgery

## 2016-07-11 MED ORDER — HYDROCODONE-ACETAMINOPHEN 7.5-325 MG PO TABS: ORAL_TABLET | ORAL | 0 refills | Status: DC

## 2016-07-11 NOTE — Telephone Encounter (Signed)
Hydrocodone -Acetaminophen  7.5/325mg   Qty 33 Tablets

## 2016-07-25 ENCOUNTER — Encounter: Payer: Self-pay | Admitting: Gastroenterology

## 2016-07-25 ENCOUNTER — Telehealth: Payer: Self-pay | Admitting: Gastroenterology

## 2016-07-25 ENCOUNTER — Ambulatory Visit: Payer: Medicare Other | Admitting: Gastroenterology

## 2016-07-25 NOTE — Telephone Encounter (Signed)
PT WAS A NO SHOW AND LETTER SENT  °

## 2016-07-25 NOTE — Progress Notes (Deleted)
Referring Provider: Bernerd Limbo* Primary Care Physician:  Nida Boatman, MD  No chief complaint on file.   HPI:   Alexandra Riley is a 83 y.o. female presenting today with a history of abdominal pain. Last seen March 2017. At that time, she declined an EGD. Noted weight loss at that visit. 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 She was referred to Vascular Surgery in [place] to discuss options but it does not appear this was ever done.   Past Medical History:  Diagnosis Date  . Constipation   . Glaucoma   . High cholesterol   . Osteoporosis   . Shortness of breath on exertion     Past Surgical History:  Procedure Laterality Date  . CATARACT EXTRACTION W/PHACO Right 01/20/2013   Procedure: CATARACT EXTRACTION PHACO AND INTRAOCULAR LENS PLACEMENT (IOC);  Surgeon: Gemma Payor, MD;  Location: [facility];  Service: Ophthalmology;  Laterality: Right;  CDE:  18.27  . CESAREAN SECTION    . EYE SURGERY    . ORIF HIP FRACTURE Right 05/28/2014   Procedure: OPEN REDUCTION INTERNAL FIXATION HIP;  Surgeon: Darreld Mclean, MD;  Location: [facility];  Service: Orthopedics;  Laterality: Right;  . TONSILLECTOMY      Current Outpatient Prescriptions  Medication Sig Dispense Refill  . aspirin EC 81 MG tablet Take 81 mg by mouth every morning.    Marland Kitchen atorvastatin (LIPITOR) 20 MG tablet     . clonazePAM (KLONOPIN) 0.5 MG tablet Take 0.5 mg by mouth at bedtime. *May take one to two tablets daily as needed For anxiety    . estradiol (ESTRACE VAGINAL) 0.1 MG/GM vaginal cream Place 0.5 Applicatorfuls vaginally 3 (three) times a week. 42.5 g 2  . HYDROcodone-acetaminophen (NORCO) 7.5-325 MG tablet One every six hours for pain as needed.  Do not drive car or  operate machinery while taking this medicine.  Must last 14 days. 28 tablet 0  . mirtazapine (REMERON) 15 MG tablet     . olopatadine (PATANOL) 0.1 % ophthalmic solution     . ondansetron (ZOFRAN) 4 MG tablet     . traMADol (ULTRAM) 50 MG tablet Take 1 tablet (50 mg total) by mouth every 6 (six) hours as needed. 60 tablet 3   No current facility-administered medications for this visit.     Allergies as of 07/25/2016  . (No Known Allergies)    Family History  Problem Relation Age of Onset  . Colon cancer Neg Hx     Social History   Social History  . Marital status: Widowed    Spouse name: N/A  . Number of children: N/A  . Years of education: N/A   Social History Main Topics  . Smoking status: Never Smoker  . Smokeless tobacco: Current User    Types: Snuff  . Alcohol use No  . Drug use: No  . Sexual activity: No   Other Topics Concern  . Not on file   Social History Narrative  . No narrative on file    Review of Systems: Gen: Denies fever, chills, anorexia. Denies fatigue, weakness, weight loss.  CV: Denies chest pain, palpitations, syncope, peripheral edema, and claudication. Resp: Denies dyspnea at rest, cough, wheezing, coughing up blood, and pleurisy. GI: Denies vomiting blood,  jaundice, and fecal incontinence.   Denies dysphagia or odynophagia. Derm: Denies rash, itching, dry skin Psych: Denies depression, anxiety, memory loss, confusion. No homicidal or suicidal ideation.  Heme: Denies bruising, bleeding, and enlarged lymph nodes.  Physical Exam: There were no vitals taken for this visit. General:   Alert and oriented. No distress noted. Pleasant and cooperative.  Head:  Normocephalic and atraumatic. Eyes:  Conjuctiva clear without scleral icterus. Mouth:  Oral mucosa pink and moist. Good dentition. No lesions. Neck:  Supple, without mass or thyromegaly. Heart:  S1, S2 present without murmurs, rubs, or gallops. Regular rate and rhythm. Abdomen:  +BS,  soft, non-tender and non-distended. No rebound or guarding. No HSM or masses noted. Msk:  Symmetrical without gross deformities. Normal posture. Pulses:  2+ DP noted bilaterally Extremities:  Without edema. Neurologic:  Alert and  oriented x4;  grossly normal neurologically. Skin:  Intact without significant lesions or rashes. Cervical Nodes:  No significant cervical adenopathy. Psych:  Alert and cooperative. Normal mood and affect.

## 2016-08-03 ENCOUNTER — Encounter: Payer: Self-pay | Admitting: Orthopaedic Surgery

## 2016-08-03 ENCOUNTER — Ambulatory Visit (INDEPENDENT_AMBULATORY_CARE_PROVIDER_SITE_OTHER): Payer: Medicare Other | Admitting: Orthopaedic Surgery

## 2016-08-03 VITALS — BP 129/67 | HR 69 | Temp 97.7°F | Ht 63.0 in | Wt 117.0 lb

## 2016-08-03 DIAGNOSIS — M25511 Pain in right shoulder: Secondary | ICD-10-CM

## 2016-08-03 DIAGNOSIS — G8929 Other chronic pain: Secondary | ICD-10-CM | POA: Diagnosis not present

## 2016-08-03 DIAGNOSIS — M25512 Pain in left shoulder: Secondary | ICD-10-CM | POA: Diagnosis not present

## 2016-08-03 MED ORDER — HYDROCODONE-ACETAMINOPHEN 7.5-325 MG PO TABS: ORAL_TABLET | ORAL | 0 refills | Status: DC

## 2016-08-03 NOTE — Progress Notes (Signed)
PROCEDURE NOTE:  The patient request injection, verbal consent was obtained.  The left shoulder was prepped appropriately after time out was performed.   Sterile technique was observed and injection of 1 cc of Depo-Medrol 40 mg with several cc's of plain xylocaine. Anesthesia was provided by ethyl chloride and a 20-gauge needle was used to inject the shoulder area. A posterior approach was used.  The injection was tolerated well.  A band aid dressing was applied.  The patient was advised to apply ice later today and tomorrow to the injection sight as needed.  PROCEDURE NOTE:  The patient request injection, verbal consent was obtained.  The right shoulder was prepped appropriately after time out was performed.   Sterile technique was observed and injection of 1 cc of Depo-Medrol 40 mg with several cc's of plain xylocaine. Anesthesia was provided by ethyl chloride and a 20-gauge needle was used to inject the shoulder area. A posterior approach was used.  The injection was tolerated well.  A band aid dressing was applied.  The patient was advised to apply ice later today and tomorrow to the injection sight as needed.  Encounter Diagnoses  Name Primary?  . Chronic right shoulder pain Yes  . Chronic left shoulder pain    I have reviewed the [place] Controlled Substance Reporting System web site prior to prescribing narcotic medicine for this patient.  Return in two months.  Call if any problem.  Electronically Signed Darreld Mclean, MD 4/5/20182:18 PM

## 2016-08-21 ENCOUNTER — Telehealth: Payer: Self-pay | Admitting: Orthopaedic Surgery

## 2016-08-21 NOTE — Telephone Encounter (Signed)
Hydrocodone-Acetaminophen  7.5/325 mg  Qty 28 Tablets °

## 2016-08-22 MED ORDER — HYDROCODONE-ACETAMINOPHEN 7.5-325 MG PO TABS: ORAL_TABLET | ORAL | 0 refills | Status: DC

## 2016-09-05 MED ORDER — HYDROCODONE-ACETAMINOPHEN 7.5-325 MG PO TABS: ORAL_TABLET | ORAL | 0 refills | Status: DC

## 2016-09-19 ENCOUNTER — Telehealth: Payer: Self-pay | Admitting: Orthopaedic Surgery

## 2016-09-19 NOTE — Telephone Encounter (Signed)
Hydrocodone-Acetaminophen  7.5/325 mg  Qty 24 Tablets °

## 2016-09-20 MED ORDER — HYDROCODONE-ACETAMINOPHEN 7.5-325 MG PO TABS: ORAL_TABLET | ORAL | 0 refills | Status: DC

## 2016-10-03 ENCOUNTER — Ambulatory Visit (INDEPENDENT_AMBULATORY_CARE_PROVIDER_SITE_OTHER): Payer: Medicare Other | Admitting: Orthopaedic Surgery

## 2016-10-03 ENCOUNTER — Encounter: Payer: Self-pay | Admitting: Orthopaedic Surgery

## 2016-10-03 DIAGNOSIS — M25512 Pain in left shoulder: Secondary | ICD-10-CM

## 2016-10-03 DIAGNOSIS — G8929 Other chronic pain: Secondary | ICD-10-CM | POA: Insufficient documentation

## 2016-10-03 MED ORDER — HYDROCODONE-ACETAMINOPHEN 7.5-325 MG PO TABS: ORAL_TABLET | ORAL | 0 refills | Status: DC

## 2016-10-03 NOTE — Progress Notes (Signed)
PROCEDURE NOTE:  The patient request injection, verbal consent was obtained.  The left shoulder was prepped appropriately after time out was performed.   Sterile technique was observed and injection of 1 cc of Depo-Medrol 40 mg with several cc's of plain xylocaine. Anesthesia was provided by ethyl chloride and a 20-gauge needle was used to inject the shoulder area. A posterior approach was used.  The injection was tolerated well.  A band aid dressing was applied.  The patient was advised to apply ice later today and tomorrow to the injection sight as needed.  I have reviewed the [place] Cont[place] Substance Reporting System web site prior to prescribing narcotic medicine for this patient.  Encounter Diagnosis  Name Primary?  . Chronic left shoulder pain Yes   Return in one month.  Call if any problem.  Precautions discussed.  Electronically Signed Darreld McleanWayne Loren Vicens, MD 6/5/20182:20 PM

## 2016-10-10 ENCOUNTER — Encounter (HOSPITAL_COMMUNITY): Payer: Self-pay | Admitting: Emergency Medicine

## 2016-10-10 ENCOUNTER — Emergency Department (HOSPITAL_COMMUNITY): Payer: Medicare Other

## 2016-10-10 ENCOUNTER — Emergency Department (HOSPITAL_COMMUNITY)
Admission: EM | Admit: 2016-10-10 | Discharge: 2016-10-10 | Disposition: A | Discharge status: Home / Self Care | Payer: Medicare Other | Attending: Emergency Medicine | Admitting: Emergency Medicine

## 2016-10-10 DIAGNOSIS — W19XXXA Unspecified fall, initial encounter: Secondary | ICD-10-CM

## 2016-10-10 DIAGNOSIS — Y939 Activity, unspecified: Secondary | ICD-10-CM | POA: Insufficient documentation

## 2016-10-10 DIAGNOSIS — Y92009 Unspecified place in unspecified non-institutional (private) residence as the place of occurrence of the external cause: Secondary | ICD-10-CM | POA: Diagnosis not present

## 2016-10-10 DIAGNOSIS — Z79899 Other long term (current) drug therapy: Secondary | ICD-10-CM | POA: Insufficient documentation

## 2016-10-10 DIAGNOSIS — Z7982 Long term (current) use of aspirin: Secondary | ICD-10-CM | POA: Diagnosis not present

## 2016-10-10 DIAGNOSIS — F17290 Nicotine dependence, other tobacco product, uncomplicated: Secondary | ICD-10-CM | POA: Diagnosis not present

## 2016-10-10 DIAGNOSIS — W1809XA Striking against other object with subsequent fall, initial encounter: Secondary | ICD-10-CM | POA: Insufficient documentation

## 2016-10-10 DIAGNOSIS — Z23 Encounter for immunization: Secondary | ICD-10-CM | POA: Diagnosis not present

## 2016-10-10 DIAGNOSIS — S0101XA Laceration without foreign body of scalp, initial encounter: Secondary | ICD-10-CM | POA: Insufficient documentation

## 2016-10-10 DIAGNOSIS — Y999 Unspecified external cause status: Secondary | ICD-10-CM | POA: Insufficient documentation

## 2016-10-10 DIAGNOSIS — S0990XA Unspecified injury of head, initial encounter: Secondary | ICD-10-CM | POA: Diagnosis present

## 2016-10-10 DIAGNOSIS — S0003XA Contusion of scalp, initial encounter: Secondary | ICD-10-CM

## 2016-10-10 MED ORDER — TETANUS-DIPHTH-ACELL PERTUSSIS 5-2.5-18.5 LF-MCG/0.5 IM SUSP
0.5000 mL | Freq: Once | INTRAMUSCULAR | Status: AC
  Administered 2016-10-10: 0.5 mL via INTRAMUSCULAR
  Filled 2016-10-10: qty 0.5

## 2016-10-10 MED ORDER — HYDROGEN PEROXIDE 3 % EX SOLN
CUTANEOUS | Status: AC
  Filled 2016-10-10: qty 473

## 2016-10-10 NOTE — ED Notes (Signed)
Pt back from CT

## 2016-10-10 NOTE — ED Notes (Signed)
Head cleaned of dried blood with peroxide, wound found and Dr. Preston FleetingGlick inserted 2 staples. Pt tolerated procedure well; family at bedside

## 2016-10-10 NOTE — Discharge Instructions (Signed)
Staples need to be removed in 7-10 days. That can be done here, at an urgent care center, or at your doctor's office. °

## 2016-10-10 NOTE — ED Provider Notes (Signed)
[facility] Provider Note   CSN: [id number] Arrival date & time: 10/10/16  4098     History   Chief Complaint Chief Complaint  Patient presents with  . Fall    HPI Alexandra Riley is a 83 y.o. female.  The history is provided by the patient.  She lost her balance at home, and fell striking the back of her head. She thinks there may been brief loss of consciousness. She denies dizziness, nausea, vomiting. She is not on any anticoagulants. She denies other injury. She does not know when her last tetanus immunization was.  Past Medical History:  Diagnosis Date  . Constipation   . Glaucoma   . High cholesterol   . Osteoporosis   . Shortness of breath on exertion     Patient Active Problem List   Diagnosis Date Noted  . Chronic left shoulder pain 10/03/2016  . Abdominal pain 07/05/2015  . Hematometra 06/24/2015  . Postmenopausal vaginal bleeding 06/24/2015  . Hypokalemia 11/11/2014  . Recurrent falls 11/11/2014  . Rhabdomyolysis 11/11/2014  . Hip fracture (HCC) 05/26/2014    Past Surgical History:  Procedure Laterality Date  . CATARACT EXTRACTION W/PHACO Right 01/20/2013   Procedure: CATARACT EXTRACTION PHACO AND INTRAOCULAR LENS PLACEMENT (IOC);  Surgeon: Gemma Payor, MD;  Location: [facility];  Service: Ophthalmology;  Laterality: Right;  CDE:  18.27  . CESAREAN SECTION    . EYE SURGERY    . ORIF HIP FRACTURE Right 05/28/2014   Procedure: OPEN REDUCTION INTERNAL FIXATION HIP;  Surgeon: Darreld Mclean, MD;  Location: [facility];  Service: Orthopedics;  Laterality: Right;  . TONSILLECTOMY      OB History    Gravida Para Term Preterm AB Living   4 3 3     3    SAB TAB Ectopic Multiple Live Births                   Home Medications    Prior to Admission medications   Medication Sig Start Date End Date Taking? Authorizing Provider  aspirin EC 81 MG tablet Take 81 mg by mouth every morning.    [provider]  atorvastatin (LIPITOR) 20 MG tablet  07/27/15    [provider]  clonazePAM (KLONOPIN) 0.5 MG tablet Take 0.5 mg by mouth at bedtime. *May take one to two tablets daily as needed For anxiety    [provider]  estradiol (ESTRACE VAGINAL) 0.1 MG/GM vaginal cream Place 0.5 Applicatorfuls vaginally 3 (three) times a week. 06/29/15   Tilda Burrow, MD  HYDROcodone-acetaminophen (NORCO) 7.5-325 MG tablet One every six hours for pain as needed.  Do not drive car or operate machinery while taking this medicine.  Must last 14 days. 10/03/16   Darreld Mclean, MD  mirtazapine (REMERON) 15 MG tablet  10/22/15   [provider]  olopatadine (PATANOL) 0.1 % ophthalmic solution  10/11/15   [provider]  ondansetron (ZOFRAN) 4 MG tablet  11/22/15   [provider]  traMADol (ULTRAM) 50 MG tablet Take 1 tablet (50 mg total) by mouth every 6 (six) hours as needed. 12/09/15   Darreld Mclean, MD    Family History Family History  Problem Relation Age of Onset  . Colon cancer Neg Hx     Social History Social History  Substance Use Topics  . Smoking status: Never Smoker  . Smokeless tobacco: Current User    Types: Snuff  . Alcohol use No     Allergies  Patient has no known allergies.   Review of Systems Review of Systems  All other systems reviewed and are negative.    Physical Exam Updated Vital Signs BP (!) 144/72 (BP Location: Right Arm)   Pulse 67   Temp 97.9 F (36.6 C) (Oral)   Resp 18   Ht 5\' 4"  (1.626 m)   Wt 52.2 kg (115 lb)   SpO2 98%   BMI 19.74 kg/m   Physical Exam  Nursing note and vitals reviewed.  83 year old female, resting comfortably and in no acute distress. Vital signs are significant for mild hypertension. Oxygen saturation is 98%, which is normal. Head is normocephalic. Scalp laceration present right parietal area, scalp hematoma present right occipital area. PERRLA, EOMI. Oropharynx is clear. Neck is immobilized in a stiff cervical collar and is nontender without  adenopathy or JVD. Back is nontender and there is no CVA tenderness. Lungs are clear without rales, wheezes, or rhonchi. Chest is nontender. Heart has regular rate and rhythm without murmur. Abdomen is soft, flat, nontender without masses or hepatosplenomegaly and peristalsis is normoactive. Extremities have no cyanosis or edema, full range of motion is present. Skin is warm and dry without rash. Neurologic: Mental status is normal, cranial nerves are intact, there are no motor or sensory deficits.  ED Treatments / Results   Radiology Ct Head Wo Contrast  Result Date: 10/10/2016 CLINICAL DATA:  Status post fall. Hit right side of head, with posterior neck pain and headache. Initial encounter. EXAM: CT HEAD WITHOUT CONTRAST CT CERVICAL SPINE WITHOUT CONTRAST TECHNIQUE: Multidetector CT imaging of the head and cervical spine was performed following the standard protocol without intravenous contrast. Multiplanar CT image reconstructions of the cervical spine were also generated. COMPARISON:  CT of the head and cervical spine performed 11/11/2014 FINDINGS: CT HEAD FINDINGS Brain: No evidence of acute infarction, hemorrhage, hydrocephalus, extra-axial collection or mass lesion/mass effect. Prominence of the ventricles and sulci reflects moderate age-appropriate cortical volume loss. Cerebellar atrophy is noted. Scattered periventricular and subcortical white matter change likely reflects small vessel ischemic microangiopathy. Chronic ischemic change is noted at the external capsule bilaterally. The brainstem and fourth ventricle are within normal limits. The cerebral hemispheres demonstrate grossly normal gray-white differentiation. No mass effect or midline shift is seen. Vascular: No hyperdense vessel or unexpected calcification. Skull: There is no evidence of fracture; visualized osseous structures are unremarkable in appearance. Sinuses/Orbits: The orbits are within normal limits. The paranasal sinuses  and mastoid air cells are well-aerated. Other: Soft tissue swelling is noted at the right occiput. CT CERVICAL SPINE FINDINGS Alignment: Normal. Skull base and vertebrae: No acute fracture. No primary bone lesion or focal pathologic process. Soft tissues and spinal canal: No prevertebral fluid or swelling. No visible canal hematoma. Disc levels: Intervertebral disc spaces are grossly preserved. Mild facet disease is noted at the mid cervical spine. Mild degenerative change is noted about the dens. Upper chest: Scarring and calcification are noted at the lung apices. Calcification is seen at the carotid bifurcations bilaterally. A 1.2 cm right thyroid hypodensity is likely benign, given its size. Other: No additional soft tissue abnormalities are seen. IMPRESSION: 1. No evidence of traumatic intracranial injury or fracture. 2. No evidence of fracture or subluxation along the cervical spine. 3. Soft tissue swelling at the right occiput. 4. Moderate age-appropriate cortical volume loss and scattered small vessel ischemic microangiopathy. 5. Chronic ischemic change at the external capsule bilaterally. 6. Scarring and calcification at the lung apices. 7. Calcification at  the carotid bifurcations bilaterally. Carotid ultrasound could be considered for further evaluation, when and as deemed clinically appropriate. Electronically Signed   By: Roanna Raider M.D.   On: 10/10/2016 04:09   Ct Cervical Spine Wo Contrast  Result Date: 10/10/2016 CLINICAL DATA:  Status post fall. Hit right side of head, with posterior neck pain and headache. Initial encounter. EXAM: CT HEAD WITHOUT CONTRAST CT CERVICAL SPINE WITHOUT CONTRAST TECHNIQUE: Multidetector CT imaging of the head and cervical spine was performed following the standard protocol without intravenous contrast. Multiplanar CT image reconstructions of the cervical spine were also generated. COMPARISON:  CT of the head and cervical spine performed 11/11/2014 FINDINGS: CT  HEAD FINDINGS Brain: No evidence of acute infarction, hemorrhage, hydrocephalus, extra-axial collection or mass lesion/mass effect. Prominence of the ventricles and sulci reflects moderate age-appropriate cortical volume loss. Cerebellar atrophy is noted. Scattered periventricular and subcortical white matter change likely reflects small vessel ischemic microangiopathy. Chronic ischemic change is noted at the external capsule bilaterally. The brainstem and fourth ventricle are within normal limits. The cerebral hemispheres demonstrate grossly normal gray-white differentiation. No mass effect or midline shift is seen. Vascular: No hyperdense vessel or unexpected calcification. Skull: There is no evidence of fracture; visualized osseous structures are unremarkable in appearance. Sinuses/Orbits: The orbits are within normal limits. The paranasal sinuses and mastoid air cells are well-aerated. Other: Soft tissue swelling is noted at the right occiput. CT CERVICAL SPINE FINDINGS Alignment: Normal. Skull base and vertebrae: No acute fracture. No primary bone lesion or focal pathologic process. Soft tissues and spinal canal: No prevertebral fluid or swelling. No visible canal hematoma. Disc levels: Intervertebral disc spaces are grossly preserved. Mild facet disease is noted at the mid cervical spine. Mild degenerative change is noted about the dens. Upper chest: Scarring and calcification are noted at the lung apices. Calcification is seen at the carotid bifurcations bilaterally. A 1.2 cm right thyroid hypodensity is likely benign, given its size. Other: No additional soft tissue abnormalities are seen. IMPRESSION: 1. No evidence of traumatic intracranial injury or fracture. 2. No evidence of fracture or subluxation along the cervical spine. 3. Soft tissue swelling at the right occiput. 4. Moderate age-appropriate cortical volume loss and scattered small vessel ischemic microangiopathy. 5. Chronic ischemic change at the  external capsule bilaterally. 6. Scarring and calcification at the lung apices. 7. Calcification at the carotid bifurcations bilaterally. Carotid ultrasound could be considered for further evaluation, when and as deemed clinically appropriate. Electronically Signed   By: Roanna Raider M.D.   On: 10/10/2016 04:09    Procedures Procedures (including critical care time) LACERATION REPAIR Performed by: WUJWJ,[email or web address] Authorized by: WGNFA,[email or web address] Consent: Verbal consent obtained. Risks and benefits: risks, benefits and alternatives were discussed Consent given by: patient Patient identity confirmed: provided demographic data Prepped and Draped in normal sterile fashion Wound explored  Laceration Location: Scalp  Laceration Length: 1 cm  No Foreign Bodies seen or palpated  Anesthesia: None  Amount of cleaning: standard  Skin closure: Close   Number of staples: 2   Technique: Surgical stapling   Patient tolerance: Patient tolerated the procedure well with no immediate complications.   Medications Ordered in ED Medications  hydrogen peroxide 3 % external solution (not administered)  Tdap (BOOSTRIX) injection 0.5 mL (0.5 mLs Intramuscular Given 10/10/16 0435)     Initial Impression / Assessment and Plan / ED Course  I have reviewed the triage vital signs and the nursing notes.  Pertinent labs & imaging results that were available  during my care of the patient were reviewed by me and considered in my medical decision making (see chart for details).  Fall with scalp laceration. No evidence of concussion on exam. Old records are reviewed, and last ED visit for fall was 3 years ago. No record of prior tetanus immunizations. She will be given Tdap booster and sent for CT of head and cervical spine. Laceration will need to be closed with staples.  CT shows no evidence of intracranial injury. Laceration is closed with staples. She is discharged with contractions have staples removed in  7-10 days.  Final Clinical Impressions(s) / ED Diagnoses   Final diagnoses:  Fall at home, initial encounter  Scalp hematoma, initial encounter  Scalp laceration, initial encounter    New Prescriptions New Prescriptions   No medications on file     Dione BoozeGlick, Natilie Krabbenhoft, MD 10/10/16 [phone]

## 2016-10-10 NOTE — ED Triage Notes (Signed)
Pt found in floor by ems, pt's lifeline called ems. Pt has lac to the back of the head, bleeding controlled. Pt has c collar on.

## 2016-10-16 ENCOUNTER — Telehealth: Payer: Self-pay | Admitting: Orthopaedic Surgery

## 2016-10-16 NOTE — Telephone Encounter (Signed)
Patient and aid called to request refill:  HYDROcodone-acetaminophen (NORCO) 7.5-325 MG tablet 22 tablet   -patient does not currently have a follow up appointment.

## 2016-10-17 MED ORDER — HYDROCODONE-ACETAMINOPHEN 7.5-325 MG PO TABS: ORAL_TABLET | ORAL | 0 refills | Status: DC

## 2016-10-30 ENCOUNTER — Telehealth: Payer: Self-pay | Admitting: Orthopedic Surgery

## 2016-10-30 ENCOUNTER — Other Ambulatory Visit: Payer: Self-pay | Admitting: *Deleted

## 2016-10-30 MED ORDER — HYDROCODONE-ACETAMINOPHEN 7.5-325 MG PO TABS: ORAL_TABLET | ORAL | 0 refills | Status: DC

## 2016-10-30 NOTE — Telephone Encounter (Signed)
Patient requests refill on Hydrocodone/Acetaminophen (Norco)  7.5-25  Mgs.   Qty  20  Sig: One every six hours for pain as needed. Do not drive car or operate machinery while taking this medicine. Must last 14 days.

## 2016-11-08 ENCOUNTER — Ambulatory Visit (INDEPENDENT_AMBULATORY_CARE_PROVIDER_SITE_OTHER): Payer: Medicare Other | Admitting: Orthopaedic Surgery

## 2016-11-08 ENCOUNTER — Ambulatory Visit (INDEPENDENT_AMBULATORY_CARE_PROVIDER_SITE_OTHER): Payer: Medicare Other

## 2016-11-08 VITALS — BP 131/69 | HR 69 | Temp 97.7°F

## 2016-11-08 DIAGNOSIS — M25511 Pain in right shoulder: Secondary | ICD-10-CM

## 2016-11-08 DIAGNOSIS — G8929 Other chronic pain: Secondary | ICD-10-CM | POA: Diagnosis not present

## 2016-11-08 DIAGNOSIS — M25561 Pain in right knee: Secondary | ICD-10-CM

## 2016-11-08 DIAGNOSIS — M25512 Pain in left shoulder: Secondary | ICD-10-CM

## 2016-11-09 ENCOUNTER — Encounter: Payer: Self-pay | Admitting: Orthopaedic Surgery

## 2016-11-09 NOTE — Progress Notes (Signed)
Patient XB:JYNW:Alexandra Riley, female DOB:08/21/1921, 83 y.o. GNF:[id number]  Chief Complaint  Patient presents with  . New Problem    Right Knee Pain    HPI  Alexandra Riley is a 83 y.o. female who has chronic pain of the right shoulder.  She is stable there.  She has pain at night and sometimes with overhead use.  She has developed pain of the right knee.  She has swelling and popping.  She has no trauma.  She has no redness or giving way.  HPI  There is no height or weight on file to calculate BMI. She is unable to stand.  ROS  Review of Systems  Respiratory: Positive for shortness of breath.   Gastrointestinal: Positive for constipation.  Musculoskeletal: Positive for gait problem (needs walker.  History of falls.  Workup negative in past for cause of falls.).  Neurological: Positive for syncope and headaches.    Past Medical History:  Diagnosis Date  . Constipation   . Glaucoma   . High cholesterol   . Osteoporosis   . Shortness of breath on exertion     Past Surgical History:  Procedure Laterality Date  . CATARACT EXTRACTION W/PHACO Right 01/20/2013   Procedure: CATARACT EXTRACTION PHACO AND INTRAOCULAR LENS PLACEMENT (IOC);  Surgeon: Gemma PayorKerry Hunt, MD;  Location: [facility];  Service: Ophthalmology;  Laterality: Right;  CDE:  18.27  . CESAREAN SECTION    . EYE SURGERY    . ORIF HIP FRACTURE Right 05/28/2014   Procedure: OPEN REDUCTION INTERNAL FIXATION HIP;  Surgeon: Darreld McleanWayne Caidon Foti, MD;  Location: [facility];  Service: Orthopedics;  Laterality: Right;  . TONSILLECTOMY      Family History  Problem Relation Age of Onset  . Colon cancer Neg Hx     Social History Social History  Substance Use Topics  . Smoking status: Never Smoker  . Smokeless tobacco: Current User    Types: Snuff  . Alcohol use No    No Known Allergies  Current Outpatient Prescriptions  Medication Sig Dispense Refill  . aspirin EC 81 MG tablet Take 81 mg by mouth every morning.    Marland Kitchen. atorvastatin  (LIPITOR) 20 MG tablet     . clonazePAM (KLONOPIN) 0.5 MG tablet Take 0.5 mg by mouth at bedtime. *May take one to two tablets daily as needed For anxiety    . estradiol (ESTRACE VAGINAL) 0.1 MG/GM vaginal cream Place 0.5 Applicatorfuls vaginally 3 (three) times a week. 42.5 g 2  . HYDROcodone-acetaminophen (NORCO) 7.5-325 MG tablet One every six hours for pain as needed.  Do not drive car or operate machinery while taking this medicine.  Must last 14 days. 20 tablet 0  . mirtazapine (REMERON) 15 MG tablet     . olopatadine (PATANOL) 0.1 % ophthalmic solution     . ondansetron (ZOFRAN) 4 MG tablet     . traMADol (ULTRAM) 50 MG tablet Take 1 tablet (50 mg total) by mouth every 6 (six) hours as needed. 60 tablet 3   No current facility-administered medications for this visit.      Physical Exam  Blood pressure 131/69, pulse 69, temperature 97.7 F (36.5 C).  Constitutional: overall normal hygiene, normal nutrition, well developed, normal grooming, normal body habitus. Assistive device:wheelchair  Musculoskeletal: gait and station Limp unable to stand today, muscle tone and strength are normal, no tremors or atrophy is present.  .  Neurological: coordination overall normal.  Deep tendon reflex/nerve stretch intact.  Sensation normal.  Cranial nerves  II-XII intact.   Skin:   Normal overall no scars, lesions, ulcers or rashes. No psoriasis.  Psychiatric: Alert and oriented x 3.  Recent memory intact, remote memory unclear.  Normal mood and affect. Well groomed.  Good eye contact.  Cardiovascular: overall no swelling, no varicosities, no edema bilaterally, normal temperatures of the legs and arms, no clubbing, cyanosis and good capillary refill.  Lymphatic: palpation is normal.  The right lower extremity is examined:  Inspection:  Thigh:  Non-tender and no defects  Knee has swelling 1+ effusion.                        Joint tenderness is present                        Patient is  tender over the medial joint line  Lower Leg:  Has normal appearance and no tenderness or defects  Ankle:  Non-tender and no defects  Foot:  Non-tender and no defects Range of Motion:  Knee:  Range of motion is: 0-85                        Crepitus is  present  Ankle:  Range of motion is normal. Strength and Tone:  The right lower extremity has normal strength and tone. Stability:  Knee:  The knee is stable.  Ankle:  The ankle is stable.  The shoulders are very tender, right more than left today.  She has good ROM but has crepitus.  NV intact.  Muscle tone and strength are good for her age.  The patient has been educated about the nature of the problem(s) and counseled on treatment options.  The patient appeared to understand what I have discussed and is in agreement with it.  Encounter Diagnoses  Name Primary?  . Acute pain of right knee Yes  . Chronic left shoulder pain    PROCEDURE NOTE:  The patient requests injections of the right knee , verbal consent was obtained.  The right knee was prepped appropriately after time out was performed.   Sterile technique was observed and injection of 1 cc of Depo-Medrol 40 mg with several cc's of plain xylocaine. Anesthesia was provided by ethyl chloride and a 20-gauge needle was used to inject the knee area. The injection was tolerated well.  A band aid dressing was applied.  The patient was advised to apply ice later today and tomorrow to the injection sight as needed.  PLAN Call if any problems.  Precautions discussed.  Continue current medications.   Return to clinic 1 month   Electronically Signed Darreld Mclean, MD 7/12/20188:02 AM

## 2016-11-14 ENCOUNTER — Ambulatory Visit: Payer: Medicare Other | Admitting: Orthopaedic Surgery

## 2016-11-14 ENCOUNTER — Telehealth: Payer: Self-pay | Admitting: Orthopaedic Surgery

## 2016-11-14 NOTE — Telephone Encounter (Signed)
Hydrocodone-Acetaminophen  7.5/325 MG Qty 20 Tablets

## 2016-11-15 MED ORDER — HYDROCODONE-ACETAMINOPHEN 5-325 MG PO TABS: 1.0000 | ORAL_TABLET | Freq: Four times a day (QID) | ORAL | 0 refills | Status: DC | PRN

## 2016-11-20 ENCOUNTER — Emergency Department (HOSPITAL_COMMUNITY): Payer: Medicare Other

## 2016-11-20 ENCOUNTER — Observation Stay (HOSPITAL_COMMUNITY)
Admission: EM | Admit: 2016-11-20 | Discharge: 2016-11-24 | Disposition: A | Discharge status: Skilled Nursing Facility | Payer: Medicare Other | Attending: Nephrology | Admitting: Nephrology

## 2016-11-20 ENCOUNTER — Encounter (HOSPITAL_COMMUNITY): Payer: Self-pay | Admitting: *Deleted

## 2016-11-20 DIAGNOSIS — K5900 Constipation, unspecified: Secondary | ICD-10-CM | POA: Insufficient documentation

## 2016-11-20 DIAGNOSIS — Z79899 Other long term (current) drug therapy: Secondary | ICD-10-CM | POA: Insufficient documentation

## 2016-11-20 DIAGNOSIS — F17290 Nicotine dependence, other tobacco product, uncomplicated: Secondary | ICD-10-CM | POA: Diagnosis not present

## 2016-11-20 DIAGNOSIS — R109 Unspecified abdominal pain: Secondary | ICD-10-CM | POA: Diagnosis not present

## 2016-11-20 DIAGNOSIS — M549 Dorsalgia, unspecified: Secondary | ICD-10-CM

## 2016-11-20 DIAGNOSIS — E785 Hyperlipidemia, unspecified: Secondary | ICD-10-CM | POA: Insufficient documentation

## 2016-11-20 DIAGNOSIS — Z7982 Long term (current) use of aspirin: Secondary | ICD-10-CM | POA: Insufficient documentation

## 2016-11-20 DIAGNOSIS — W010XXA Fall on same level from slipping, tripping and stumbling without subsequent striking against object, initial encounter: Secondary | ICD-10-CM | POA: Insufficient documentation

## 2016-11-20 DIAGNOSIS — S32019A Unspecified fracture of first lumbar vertebra, initial encounter for closed fracture: Principal | ICD-10-CM | POA: Insufficient documentation

## 2016-11-20 DIAGNOSIS — Y93E9 Activity, other interior property and clothing maintenance: Secondary | ICD-10-CM | POA: Insufficient documentation

## 2016-11-20 DIAGNOSIS — H409 Unspecified glaucoma: Secondary | ICD-10-CM | POA: Insufficient documentation

## 2016-11-20 DIAGNOSIS — F419 Anxiety disorder, unspecified: Secondary | ICD-10-CM | POA: Diagnosis not present

## 2016-11-20 DIAGNOSIS — Y92003 Bedroom of unspecified non-institutional (private) residence as the place of occurrence of the external cause: Secondary | ICD-10-CM | POA: Insufficient documentation

## 2016-11-20 DIAGNOSIS — R262 Difficulty in walking, not elsewhere classified: Secondary | ICD-10-CM | POA: Insufficient documentation

## 2016-11-20 DIAGNOSIS — R1084 Generalized abdominal pain: Secondary | ICD-10-CM | POA: Insufficient documentation

## 2016-11-20 DIAGNOSIS — M4856XA Collapsed vertebra, not elsewhere classified, lumbar region, initial encounter for fracture: Secondary | ICD-10-CM | POA: Insufficient documentation

## 2016-11-20 DIAGNOSIS — M810 Age-related osteoporosis without current pathological fracture: Secondary | ICD-10-CM | POA: Insufficient documentation

## 2016-11-20 DIAGNOSIS — Z9181 History of falling: Secondary | ICD-10-CM | POA: Diagnosis not present

## 2016-11-20 DIAGNOSIS — F329 Major depressive disorder, single episode, unspecified: Secondary | ICD-10-CM | POA: Diagnosis not present

## 2016-11-20 DIAGNOSIS — Z66 Do not resuscitate: Secondary | ICD-10-CM | POA: Diagnosis not present

## 2016-11-20 DIAGNOSIS — S32010A Wedge compression fracture of first lumbar vertebra, initial encounter for closed fracture: Secondary | ICD-10-CM | POA: Diagnosis present

## 2016-11-20 DIAGNOSIS — Z79890 Hormone replacement therapy (postmenopausal): Secondary | ICD-10-CM | POA: Diagnosis not present

## 2016-11-20 DIAGNOSIS — R030 Elevated blood-pressure reading, without diagnosis of hypertension: Secondary | ICD-10-CM | POA: Insufficient documentation

## 2016-11-20 LAB — COMPREHENSIVE METABOLIC PANEL
ALT: 13 U/L — ABNORMAL LOW (ref 14–54)
ANION GAP: 10 (ref 5–15)
AST: 17 U/L (ref 15–41)
Albumin: 4 g/dL (ref 3.5–5.0)
Alkaline Phosphatase: 69 U/L (ref 38–126)
BILIRUBIN TOTAL: 0.6 mg/dL (ref 0.3–1.2)
BUN: 13 mg/dL (ref 6–20)
CO2: 23 mmol/L (ref 22–32)
Calcium: 9.7 mg/dL (ref 8.9–10.3)
Chloride: 109 mmol/L (ref 101–111)
Creatinine, Ser: 1.1 mg/dL — ABNORMAL HIGH (ref 0.44–1.00)
GFR calc Af Amer: 49 mL/min — ABNORMAL LOW (ref >60)
GFR, EST NON AFRICAN AMERICAN: 42 mL/min — AB (ref >60)
Glucose, Bld: 92 mg/dL (ref 65–99)
POTASSIUM: 4.1 mmol/L (ref 3.5–5.1)
Sodium: 142 mmol/L (ref 135–145)
TOTAL PROTEIN: 7.1 g/dL (ref 6.5–8.1)

## 2016-11-20 LAB — URINALYSIS, ROUTINE W REFLEX MICROSCOPIC
Bacteria, UA: NONE SEEN
Bilirubin Urine: NEGATIVE
GLUCOSE, UA: NEGATIVE mg/dL
Ketones, ur: NEGATIVE mg/dL
NITRITE: NEGATIVE
PH: 5 (ref 5.0–8.0)
PROTEIN: NEGATIVE mg/dL
SPECIFIC GRAVITY, URINE: 1.009 (ref 1.005–1.030)

## 2016-11-20 LAB — CBC WITH DIFFERENTIAL/PLATELET
BASOS PCT: 0 %
Basophils Absolute: 0 10*3/uL (ref 0.0–0.1)
EOS ABS: 0 10*3/uL (ref 0.0–0.7)
Eosinophils Relative: 0 %
HEMATOCRIT: 40.4 % (ref 36.0–46.0)
Hemoglobin: 12.7 g/dL (ref 12.0–15.0)
Lymphocytes Relative: 9 %
Lymphs Abs: 1 10*3/uL (ref 0.7–4.0)
MCH: 29.7 pg (ref 26.0–34.0)
MCHC: 31.4 g/dL (ref 30.0–36.0)
MCV: 94.4 fL (ref 78.0–100.0)
MONO ABS: 1 10*3/uL (ref 0.1–1.0)
Monocytes Relative: 9 %
NEUTROS ABS: 8.9 10*3/uL — AB (ref 1.7–7.7)
NEUTROS PCT: 82 %
Platelets: 205 10*3/uL (ref 150–400)
RBC: 4.28 MIL/uL (ref 3.87–5.11)
RDW: 13.9 % (ref 11.5–15.5)
WBC: 10.9 10*3/uL — ABNORMAL HIGH (ref 4.0–10.5)

## 2016-11-20 LAB — POC OCCULT BLOOD, ED: Fecal Occult Bld: NEGATIVE

## 2016-11-20 LAB — LIPASE, BLOOD: LIPASE: 55 U/L — AB (ref 11–51)

## 2016-11-20 LAB — I-STAT CG4 LACTIC ACID, ED: LACTIC ACID, VENOUS: 1 mmol/L (ref 0.5–1.9)

## 2016-11-20 MED ORDER — IOPAMIDOL (ISOVUE-300) INJECTION 61%
75.0000 mL | Freq: Once | INTRAVENOUS | Status: AC | PRN
  Administered 2016-11-20: 75 mL via INTRAVENOUS

## 2016-11-20 MED ORDER — POLYETHYLENE GLYCOL 3350 17 G PO PACK
17.0000 g | PACK | Freq: Every day | ORAL | Status: DC
  Administered 2016-11-21 – 2016-11-22 (×2): 17 g via ORAL
  Filled 2016-11-20 (×3): qty 1

## 2016-11-20 MED ORDER — SODIUM CHLORIDE 0.9 % IV BOLUS (SEPSIS)
500.0000 mL | Freq: Once | INTRAVENOUS | Status: AC
  Administered 2016-11-20: 500 mL via INTRAVENOUS

## 2016-11-20 MED ORDER — ACETAMINOPHEN 500 MG PO TABS
1000.0000 mg | ORAL_TABLET | Freq: Once | ORAL | Status: AC
  Administered 2016-11-20: 1000 mg via ORAL
  Filled 2016-11-20: qty 2

## 2016-11-20 MED ORDER — MILK AND MOLASSES ENEMA
1.0000 | Freq: Once | RECTAL | Status: DC | PRN
  Filled 2016-11-20: qty 250

## 2016-11-20 MED ORDER — BISACODYL 5 MG PO TBEC: 5.0000 mg | DELAYED_RELEASE_TABLET | Freq: Every day | ORAL | Status: DC | PRN

## 2016-11-20 MED ORDER — MORPHINE SULFATE (PF) 2 MG/ML IV SOLN
2.0000 mg | Freq: Once | INTRAVENOUS | Status: AC
  Administered 2016-11-20: 2 mg via INTRAVENOUS
  Filled 2016-11-20: qty 1

## 2016-11-20 NOTE — ED Triage Notes (Signed)
Pt reports falling at her bedside 1 week ago when she lost balance.  Pt says she hurts "all over" and mostly in abdomen, back and side 10/10 pain.  Pt says she feels constipated and drinks prune juice with no relief. LBM yesterday but was mostly loose.

## 2016-11-20 NOTE — H&P (Signed)
History and Physical    Alexandra Riley Alexandra Riley:[id number] DOB: 07/28/1921 DOA: 11/20/2016  PCP:  [facility] Consultants:  Hilda Lias - orthopedics Patient coming from: Home - lives alone but she was an afternoon caregiver and has Life Alert; NOK: daughter, Maretta Los, [phone], [phone]  Chief Complaint: back and abdominal pain  HPI: Alexandra Riley is a 83 y.o. female with medical history significant of osteoporosis, HLD, constipation and glaucoma presenting with abdominal and back pain.  Her daughter provided the majority of the history.  The patient was bent over the bed making it up and she lost her footing and fell and landed on her buttocks.  It happened about 11:30 am, just prior to her daytime caregiver's arrival, about 1 1/2 weeks ago.  She wouldn't let anyone help her until her daughter arrived back in town today.  She has been complaining of severe back and abdominal pain.  Once her daughter came home, the patient agreed to come in to the ER.  The daughter has a number of family issues that mean that she is unable to stay here in the hospital with the patient, is unable to lift her or provide significant care for her at home.  She is hoping for placement in the [facility] for rehab.   ED Course:   Compression fracture on CT that appears to be new, likely related to fall.  Lives with minimal assistance at home.  Pain control, observation.  Review of Systems: Constipation; otherwise review of systems complete and negative except as above  Ambulatory Status:  Ambulates with a walker  Past Medical History:  Diagnosis Date  . Constipation   . Glaucoma   . High cholesterol   . Osteoporosis   . Shortness of breath on exertion     Past Surgical History:  Procedure Laterality Date  . CATARACT EXTRACTION W/PHACO Right 01/20/2013   Procedure: CATARACT EXTRACTION PHACO AND INTRAOCULAR LENS PLACEMENT (IOC);  Surgeon: Gemma Payor, MD;  Location: [facility];  Service: Ophthalmology;  Laterality:  Right;  CDE:  18.27  . CESAREAN SECTION    . EYE SURGERY    . ORIF HIP FRACTURE Right 05/28/2014   Procedure: OPEN REDUCTION INTERNAL FIXATION HIP;  Surgeon: Darreld Mclean, MD;  Location: [facility];  Service: Orthopedics;  Laterality: Right;  . TONSILLECTOMY      Social History   Social History  . Marital status: Widowed    Spouse name: N/A  . Number of children: N/A  . Years of education: N/A   Occupational History  . Not on file.   Social History Main Topics  . Smoking status: Never Smoker  . Smokeless tobacco: Current User    Types: Snuff  . Alcohol use No  . Drug use: No  . Sexual activity: No   Other Topics Concern  . Not on file   Social History Narrative  . No narrative on file    No Known Allergies  Family History  Problem Relation Age of Onset  . Colon cancer Neg Hx     Prior to Admission medications   Medication Sig Start Date End Date Taking? Authorizing Provider  aspirin EC 81 MG tablet Take 81 mg by mouth every morning.    [provider]  atorvastatin (LIPITOR) 20 MG tablet  07/27/15   [provider]  clonazePAM (KLONOPIN) 0.5 MG tablet Take 0.5 mg by mouth at bedtime. *May take one to two tablets daily as needed For anxiety    [provider]  estradiol (ESTRACE VAGINAL) 0.1 MG/GM vaginal cream Place 0.5 Applicatorfuls vaginally 3 (three) times a week. 06/29/15   Tilda Burrow, MD  HYDROcodone-acetaminophen (NORCO/VICODIN) 5-325 MG tablet Take 1 tablet by mouth every 6 (six) hours as needed for moderate pain (Must last 14 days.Do not take and drive a car or use machinery.). 11/15/16   Darreld Mclean, MD  mirtazapine (REMERON) 15 MG tablet  10/22/15   [provider]  olopatadine (PATANOL) 0.1 % ophthalmic solution  10/11/15   [provider]  ondansetron (ZOFRAN) 4 MG tablet  11/22/15   [provider]  traMADol (ULTRAM) 50 MG tablet Take 1 tablet (50 mg total) by mouth every 6 (six) hours as needed.  12/09/15   Darreld Mclean, MD    Physical Exam: Vitals:   11/20/16 1956 11/20/16 1957 11/20/16 2000 11/20/16 2116  BP: (!) 171/71 (!) 171/71 102/62 (!) 142/73  Pulse: 78 78  82  Resp: 18 18    Temp:      TempSrc:      SpO2:  98%  97%  Weight:      Height:         General: Appears calm and comfortable and is NAD Eyes:  PERRL, EOMI, normal lids, iris ENT:  grossly normal hearing, lips & tongue, mmm Neck:  no LAD, masses or thyromegaly Cardiovascular:  RRR, no m/r/g. No LE edema.  Respiratory:  CTA bilaterally, no w/r/r. Normal respiratory effort. Abdomen:  soft, diffusely mildly TTP, nd, NABS Skin:  no rash or induration seen on limited exam Musculoskeletal:  grossly normal tone BUE/BLE, good ROM, no bony abnormality Psychiatric:  grossly normal mood and affect, speech fluent and appropriate, AOx3 Neurologic:  CN 2-12 grossly intact, moves all extremities in coordinated fashion, sensation intact  Labs on Admission: I have personally reviewed following labs and imaging studies  CBC:  Recent Labs Lab 11/20/16 1723  WBC 10.9*  NEUTROABS 8.9*  HGB 12.7  HCT 40.4  MCV 94.4  PLT 205   Basic Metabolic Panel:  Recent Labs Lab 11/20/16 1723  NA 142  K 4.1  CL 109  CO2 23  GLUCOSE 92  BUN 13  CREATININE 1.10*  CALCIUM 9.7   GFR: Estimated Creatinine Clearance: 26.6 mL/min (A) (by C-G formula based on SCr of 1.1 mg/dL (H)). Liver Function Tests:  Recent Labs Lab 11/20/16 1723  AST 17  ALT 13*  ALKPHOS 69  BILITOT 0.6  PROT 7.1  ALBUMIN 4.0    Recent Labs Lab 11/20/16 1723  LIPASE 55*   No results for input(s): AMMONIA in the last 168 hours. Coagulation Profile: No results for input(s): INR, PROTIME in the last 168 hours. Cardiac Enzymes: No results for input(s): CKTOTAL, CKMB, CKMBINDEX, TROPONINI in the last 168 hours. BNP (last 3 results) No results for input(s): PROBNP in the last 8760 hours. HbA1C: No results for input(s): HGBA1C in the last  72 hours. CBG: No results for input(s): GLUCAP in the last 168 hours. Lipid Profile: No results for input(s): CHOL, HDL, LDLCALC, TRIG, CHOLHDL, LDLDIRECT in the last 72 hours. Thyroid Function Tests: No results for input(s): TSH, T4TOTAL, FREET4, T3FREE, THYROIDAB in the last 72 hours. Anemia Panel: No results for input(s): VITAMINB12, FOLATE, FERRITIN, TIBC, IRON, RETICCTPCT in the last 72 hours. Urine analysis:    Component Value Date/Time   COLORURINE AMBER (A) 11/20/2016 1705   APPEARANCEUR CLEAR 11/20/2016 1705   LABSPEC 1.009 11/20/2016 1705   PHURINE 5.0 11/20/2016 1705  GLUCOSEU NEGATIVE 11/20/2016 1705   HGBUR SMALL (A) 11/20/2016 1705   BILIRUBINUR NEGATIVE 11/20/2016 1705   KETONESUR NEGATIVE 11/20/2016 1705   PROTEINUR NEGATIVE 11/20/2016 1705   UROBILINOGEN 0.2 11/11/2014 0910   NITRITE NEGATIVE 11/20/2016 1705   LEUKOCYTESUR TRACE (A) 11/20/2016 1705    Creatinine Clearance: Estimated Creatinine Clearance: 26.6 mL/min (A) (by C-G formula based on SCr of 1.1 mg/dL (H)).  Sepsis Labs: @LABRCNTIP (procalcitonin:4,lacticidven:4) )No results found for this or any previous visit (from the past 240 hour(s)).   Radiological Exams on Admission: Ct Abdomen Pelvis W Contrast  Result Date: 11/20/2016 CLINICAL DATA:  falling at her bedside 1 week ago when she lost balance. Pt says she hurts "all over" and mostly in abdomen, back and side 10/10 pain. Pt says she feels constipated and drinks prune juice with no relief. LBM yesterday but was mostly loose EXAM: CT ABDOMEN AND PELVIS WITH CONTRAST TECHNIQUE: Multidetector CT imaging of the abdomen and pelvis was performed using the standard protocol following bolus administration of intravenous contrast. CONTRAST:  75mL ISOVUE-300 IOPAMIDOL (ISOVUE-300) INJECTION 61% COMPARISON:  07/20/2015 FINDINGS: Lower chest: Heavy mitral annulus calcifications. Atheromatous aorta. Hepatobiliary: No focal liver abnormality is seen. No gallstones,  gallbladder wall thickening, or biliary dilatation. Pancreas: Unremarkable. No pancreatic ductal dilatation or surrounding inflammatory changes. Spleen: Normal in size without focal abnormality. Adrenals/Urinary Tract: Normal adrenals. Prominent extrarenal pelves left greater than right. No nephrolithiasis. No focal renal lesion. Some imaging degradation secondary to breathing motion. Stomach/Bowel: Stomach, small bowel, and colon are nondilated. The rectum is distended by fluid and gas. No wall thickening or regional inflammatory change. Scattered sigmoid diverticula. Vascular/Lymphatic: Moderate aortoiliac arterial calcifications without aneurysm or stenosis. Portal vein patent. No abdominal or pelvic adenopathy localized. Multiple pelvic phleboliths. Reproductive: Uterus and bilateral adnexa are unremarkable. Other: No ascites.  No free air. Musculoskeletal: Mild L1 compression deformity with less than 20% loss of height, no retropulsion, new since previous study. Old Schmorl's node in T11. Fixation hardware in the right femur. No worrisome bone lesion. IMPRESSION: 1. L1 compression fracture deformity, new since 07/20/2015, without complicating features. 2. No acute intraabdominal process. 3. Sigmoid diverticulosis. 4.  Aortic Atherosclerosis (ICD10-170.0) Electronically Signed   By: Corlis Leak  Hassell M.D.   On: 11/20/2016 20:42    EKG: not done  Assessment/Plan Principal Problem:   Compression fracture of L1 lumbar vertebra, closed, initial encounter [facility](HCC) Active Problems:   Abdominal pain    Pertinent labs: Creatinine 1.10/GFR 42; prior 0.96/50 in 11/17 UA negative Heme negative  -patient presenting with c/o severe abdominal and back pain -Labs and imaging unremarkable other than apparent new L1 compression fracture -Daughter has significant concerns about her ability to care for her mother at home -Will observe overnight -Treat constipation -Lidocaine, home meds (Ultram, Vicodin), and morphine  for pain -IR consultation for possible vertoblasty tomorrow -SW consult for rehab placement - but since this is an Observation visit, this may require out-of-pocket expenditure. -PT consult for assistance in mobilization. -Anticipate d/c to home in next 24-48 hours.    DVT prophylaxis: SCDs Code Status: DNR - confirmed with family Family Communication: Daughter present throughout evaluation Disposition Plan:  Home once clinically improved Consults called: IR; PT/SW  Admission status: It is my clinical opinion that referral for OBSERVATION is reasonable and necessary in this patient based on the above information provided. The aforementioned taken together are felt to place the patient at high risk for further clinical deterioration. However it is anticipated that the patient may be  medically stable for discharge from the hospital within 24 to 48 hours.    Jonah Blue MD [facility]  If 7PM-7AM, please contact night-coverage [email or web address] Password [facility]  11/20/2016, 9:51 PM

## 2016-11-20 NOTE — ED Provider Notes (Signed)
Emergency Department Provider Note   I have reviewed the triage vital signs and the nursing notes.   HISTORY  Chief Complaint Abdominal Pain   HPI Alexandra Riley is a 83 y.o. female with PMH of constipation and HLD presents to the emergency room in for evaluation of diffuse abdominal pain for the last month. Family states that she's been complaining more and more of abdominal discomfort and lower back pain. She had a mechanical fall one week prior when she fell backwards and landed on her buttocks. Since that time her lower back pain has become severe and her abdominal discomfort has worsened. Patient is complaining of constipation and feeling the sensation that she needs to move her bowels. She notes history of hemorrhoids. Denies any blood in the stool. She's been drinking prune juice which has not improved her symptoms. She denies any dysuria, hesitancy, urgency. She did have a loose bowel movement yesterday. Denies chest pain or difficulty breathing.   Past Medical History:  Diagnosis Date  . Constipation   . Glaucoma   . High cholesterol   . Osteoporosis   . Shortness of breath on exertion     Patient Active Problem List   Diagnosis Date Noted  . Chronic left shoulder pain 10/03/2016  . Abdominal pain 07/05/2015  . Hematometra 06/24/2015  . Postmenopausal vaginal bleeding 06/24/2015  . Hypokalemia 11/11/2014  . Recurrent falls 11/11/2014  . Rhabdomyolysis 11/11/2014  . Hip fracture (HCC) 05/26/2014    Past Surgical History:  Procedure Laterality Date  . CATARACT EXTRACTION W/PHACO Right 01/20/2013   Procedure: CATARACT EXTRACTION PHACO AND INTRAOCULAR LENS PLACEMENT (IOC);  Surgeon: Gemma PayorKerry Hunt, MD;  Location: [facility];  Service: Ophthalmology;  Laterality: Right;  CDE:  18.27  . CESAREAN SECTION    . EYE SURGERY    . ORIF HIP FRACTURE Right 05/28/2014   Procedure: OPEN REDUCTION INTERNAL FIXATION HIP;  Surgeon: Darreld McleanWayne Keeling, MD;  Location: [facility];  Service:  Orthopedics;  Laterality: Right;  . TONSILLECTOMY      Current Outpatient Rx  . Order #: [id number] Class: Historical Med  . Order #: [id number] Class: Historical Med  . Order #: [id number] Class: Historical Med  . Order #: [id number] Class: Normal  . Order #: [id number] Class: Print  . Order #: [id number] Class: Historical Med  . Order #: [id number] Class: Historical Med  . Order #: [id number] Class: Historical Med  . Order #: [id number] Class: Print    Allergies Patient has no known allergies.  Family History  Problem Relation Age of Onset  . Colon cancer Neg Hx     Social History Social History  Substance Use Topics  . Smoking status: Never Smoker  . Smokeless tobacco: Current User    Types: Snuff  . Alcohol use No    Review of Systems  Constitutional: No fever/chills Eyes: No visual changes. ENT: No sore throat. Cardiovascular: Denies chest pain. Respiratory: Denies shortness of breath. Gastrointestinal: Positive generalized abdominal pain.  No nausea, no vomiting.  No diarrhea. Positive constipation. Genitourinary: Negative for dysuria. Musculoskeletal: Positive for back pain. Skin: Negative for rash. Neurological: Negative for headaches, focal weakness or numbness.  10-point ROS otherwise negative.  ____________________________________________   PHYSICAL EXAM:  VITAL SIGNS: ED Triage Vitals  Enc Vitals Group     BP 11/20/16 1627 134/67     Pulse Rate 11/20/16 1627 77     Resp 11/20/16 1627 18     Temp 11/20/16 1627 98.2 F (36.8 C)     Temp Source 11/20/16 1627 Oral  SpO2 11/20/16 1627 95 %     Weight 11/20/16 1630 114 lb (51.7 kg)     Height 11/20/16 1630 5\' 5"  (1.651 m)     Pain Score 11/20/16 1626 10    Constitutional: Alert and oriented. Well appearing and in no acute distress. Frail appearing.  Eyes: Conjunctivae are normal.  Head: Atraumatic. Nose: No congestion/rhinnorhea. Mouth/Throat: Mucous membranes are moist.  Oropharynx  non-erythematous. Neck: No stridor.   Cardiovascular: Normal rate, regular rhythm. Good peripheral circulation. Grossly normal heart sounds.   Respiratory: Normal respiratory effort.  No retractions. Lungs CTAB. Gastrointestinal: Soft with focal right sided tenderness. No rebound or guarding. No distention. Rectal exam with small external hemorrhoids. No peri-rectal abscess. No fissures. No thrombosed hemorrhoids. Scant, soft brown stool on rectal exam. No fecal impaction.  Musculoskeletal: No lower extremity tenderness nor edema. No gross deformities of extremities. Neurologic:  Normal speech and language. No gross focal neurologic deficits are appreciated.  Skin:  Skin is warm, dry and intact. No rash noted.  ____________________________________________   LABS (all labs ordered are listed, but only abnormal results are displayed)  Labs Reviewed  COMPREHENSIVE METABOLIC PANEL - Abnormal; Notable for the following:       Result Value   Creatinine, Ser 1.10 (*)    ALT 13 (*)    GFR calc non Af Amer 42 (*)    GFR calc Af Amer 49 (*)    All other components within normal limits  LIPASE, BLOOD - Abnormal; Notable for the following:    Lipase 55 (*)    All other components within normal limits  CBC WITH DIFFERENTIAL/PLATELET - Abnormal; Notable for the following:    WBC 10.9 (*)    Neutro Abs 8.9 (*)    All other components within normal limits  URINALYSIS, ROUTINE W REFLEX MICROSCOPIC - Abnormal; Notable for the following:    Color, Urine AMBER (*)    Hgb urine dipstick SMALL (*)    Leukocytes, UA TRACE (*)    Squamous Epithelial / LPF 0-5 (*)    All other components within normal limits  URINE CULTURE  I-STAT CG4 LACTIC ACID, ED  POC OCCULT BLOOD, ED   ____________________________________________  RADIOLOGY  Ct Abdomen Pelvis W Contrast  Result Date: 11/20/2016 CLINICAL DATA:  falling at her bedside 1 week ago when she lost balance. Pt says she hurts "all over" and mostly in  abdomen, back and side 10/10 pain. Pt says she feels constipated and drinks prune juice with no relief. LBM yesterday but was mostly loose EXAM: CT ABDOMEN AND PELVIS WITH CONTRAST TECHNIQUE: Multidetector CT imaging of the abdomen and pelvis was performed using the standard protocol following bolus administration of intravenous contrast. CONTRAST:  75mL ISOVUE-300 IOPAMIDOL (ISOVUE-300) INJECTION 61% COMPARISON:  07/20/2015 FINDINGS: Lower chest: Heavy mitral annulus calcifications. Atheromatous aorta. Hepatobiliary: No focal liver abnormality is seen. No gallstones, gallbladder wall thickening, or biliary dilatation. Pancreas: Unremarkable. No pancreatic ductal dilatation or surrounding inflammatory changes. Spleen: Normal in size without focal abnormality. Adrenals/Urinary Tract: Normal adrenals. Prominent extrarenal pelves left greater than right. No nephrolithiasis. No focal renal lesion. Some imaging degradation secondary to breathing motion. Stomach/Bowel: Stomach, small bowel, and colon are nondilated. The rectum is distended by fluid and gas. No wall thickening or regional inflammatory change. Scattered sigmoid diverticula. Vascular/Lymphatic: Moderate aortoiliac arterial calcifications without aneurysm or stenosis. Portal vein patent. No abdominal or pelvic adenopathy localized. Multiple pelvic phleboliths. Reproductive: Uterus and bilateral adnexa are unremarkable. Other: No ascites.  No  free air. Musculoskeletal: Mild L1 compression deformity with less than 20% loss of height, no retropulsion, new since previous study. Old Schmorl's node in T11. Fixation hardware in the right femur. No worrisome bone lesion. IMPRESSION: 1. L1 compression fracture deformity, new since 07/20/2015, without complicating features. 2. No acute intraabdominal process. 3. Sigmoid diverticulosis. 4.  Aortic Atherosclerosis (ICD10-170.0) Electronically Signed   By: Corlis Leak M.D.   On: 11/20/2016 20:42     ____________________________________________   PROCEDURES  Procedure(s) performed:   Procedures  None ____________________________________________   INITIAL IMPRESSION / ASSESSMENT AND PLAN / ED COURSE  Pertinent labs & imaging results that were available during my care of the patient were reviewed by me and considered in my medical decision making (see chart for details).  Patient presents to the emergency department for evaluation of diffuse abdominal discomfort and constipation with intermittent diarrhea. On rectal exam she has soft, brown stool. No fecal impaction. She does have diffuse abdominal tenderness that seems worse on the right. Given her advanced age and plan for CT scan of the abdomen and pelvis.  09:00 PM Patient with continued severe pain. New compression fracture on CT which is new from prior study. I suspect the patient sustained this during her recent fall. Patient lives has minimal assistance at home and is complaining of continued back pain here in the ED. Will add on Morphine and discuss fracture with NSG but doubt any acute mgmt other than pain control and PT/OT.   Discussed patient's case with Hospitalist, Dr. Ophelia Charter. Patient and family (if present) updated with plan. Care transferred to Hospitalist service.  I reviewed all nursing notes, vitals, pertinent old records, EKGs, labs, imaging (as available).  ____________________________________________  FINAL CLINICAL IMPRESSION(S) / ED DIAGNOSES  Final diagnoses:  Closed compression fracture of first lumbar vertebra, initial encounter (HCC)  Generalized abdominal pain     MEDICATIONS GIVEN DURING THIS VISIT:  Medications  sodium chloride 0.9 % bolus 500 mL (0 mLs Intravenous Stopped 11/20/16 1945)  acetaminophen (TYLENOL) tablet 1,000 mg (1,000 mg Oral Given 11/20/16 1855)  iopamidol (ISOVUE-300) 61 % injection 75 mL (75 mLs Intravenous Contrast Given 11/20/16 2019)  morphine 2 MG/ML injection 2 mg  (2 mg Intravenous Given 11/20/16 2101)     NEW OUTPATIENT MEDICATIONS STARTED DURING THIS VISIT:  None   Note:  This document was prepared using Dragon voice recognition software and may include unintentional dictation errors.  Alona Bene, MD Emergency Medicine    Kyann Heydt, Arlyss Repress, MD 11/20/16 2149

## 2016-11-21 ENCOUNTER — Observation Stay (HOSPITAL_COMMUNITY): Payer: Medicare Other

## 2016-11-21 DIAGNOSIS — R1084 Generalized abdominal pain: Secondary | ICD-10-CM

## 2016-11-21 DIAGNOSIS — S32010A Wedge compression fracture of first lumbar vertebra, initial encounter for closed fracture: Secondary | ICD-10-CM | POA: Diagnosis not present

## 2016-11-21 DIAGNOSIS — M545 Low back pain: Secondary | ICD-10-CM | POA: Diagnosis not present

## 2016-11-21 LAB — CBC
HCT: 37.3 % (ref 36.0–46.0)
Hemoglobin: 11.7 g/dL — ABNORMAL LOW (ref 12.0–15.0)
MCH: 29.7 pg (ref 26.0–34.0)
MCHC: 31.4 g/dL (ref 30.0–36.0)
MCV: 94.7 fL (ref 78.0–100.0)
PLATELETS: 195 10*3/uL (ref 150–400)
RBC: 3.94 MIL/uL (ref 3.87–5.11)
RDW: 13.7 % (ref 11.5–15.5)
WBC: 8 10*3/uL (ref 4.0–10.5)

## 2016-11-21 LAB — BASIC METABOLIC PANEL
Anion gap: 7 (ref 5–15)
BUN: 8 mg/dL (ref 6–20)
CALCIUM: 9.1 mg/dL (ref 8.9–10.3)
CHLORIDE: 111 mmol/L (ref 101–111)
CO2: 27 mmol/L (ref 22–32)
CREATININE: 0.91 mg/dL (ref 0.44–1.00)
GFR, EST NON AFRICAN AMERICAN: 53 mL/min — AB (ref >60)
Glucose, Bld: 94 mg/dL (ref 65–99)
Potassium: 3.8 mmol/L (ref 3.5–5.1)
SODIUM: 145 mmol/L (ref 135–145)

## 2016-11-21 MED ORDER — HYDROCODONE-ACETAMINOPHEN 5-325 MG PO TABS
1.0000 | ORAL_TABLET | Freq: Four times a day (QID) | ORAL | Status: DC | PRN
  Administered 2016-11-21 – 2016-11-23 (×8): 1 via ORAL
  Filled 2016-11-21 (×9): qty 1

## 2016-11-21 MED ORDER — MORPHINE SULFATE (PF) 2 MG/ML IV SOLN
2.0000 mg | INTRAVENOUS | Status: DC | PRN
  Administered 2016-11-21 (×3): 2 mg via INTRAVENOUS
  Filled 2016-11-21 (×4): qty 1

## 2016-11-21 MED ORDER — ACETAMINOPHEN 650 MG RE SUPP: 650.0000 mg | Freq: Four times a day (QID) | RECTAL | Status: DC | PRN

## 2016-11-21 MED ORDER — ONDANSETRON HCL 4 MG PO TABS
4.0000 mg | ORAL_TABLET | Freq: Four times a day (QID) | ORAL | Status: DC | PRN
  Administered 2016-11-22: 4 mg via ORAL
  Filled 2016-11-21: qty 1

## 2016-11-21 MED ORDER — ONDANSETRON HCL 4 MG/2ML IJ SOLN
4.0000 mg | Freq: Four times a day (QID) | INTRAMUSCULAR | Status: DC | PRN
  Administered 2016-11-24: 4 mg via INTRAVENOUS
  Filled 2016-11-21: qty 2

## 2016-11-21 MED ORDER — TRAMADOL HCL 50 MG PO TABS
50.0000 mg | ORAL_TABLET | Freq: Four times a day (QID) | ORAL | Status: DC | PRN
  Administered 2016-11-21 – 2016-11-24 (×3): 50 mg via ORAL
  Filled 2016-11-21 (×4): qty 1

## 2016-11-21 MED ORDER — SODIUM CHLORIDE 0.9 % IV SOLN
INTRAVENOUS | Status: DC
  Administered 2016-11-21 – 2016-11-24 (×4): via INTRAVENOUS

## 2016-11-21 MED ORDER — LIDOCAINE 5 % EX PTCH
1.0000 | MEDICATED_PATCH | CUTANEOUS | Status: DC
  Administered 2016-11-21 – 2016-11-23 (×3): 1 via TRANSDERMAL
  Filled 2016-11-21 (×3): qty 1

## 2016-11-21 MED ORDER — ASPIRIN EC 81 MG PO TBEC
81.0000 mg | DELAYED_RELEASE_TABLET | Freq: Every morning | ORAL | Status: DC
  Administered 2016-11-21 – 2016-11-22 (×2): 81 mg via ORAL
  Filled 2016-11-21 (×4): qty 1

## 2016-11-21 MED ORDER — NICOTINE 14 MG/24HR TD PT24
14.0000 mg | MEDICATED_PATCH | Freq: Every day | TRANSDERMAL | Status: DC
  Administered 2016-11-21 – 2016-11-24 (×4): 14 mg via TRANSDERMAL
  Filled 2016-11-21 (×4): qty 1

## 2016-11-21 MED ORDER — MIRTAZAPINE 15 MG PO TABS
15.0000 mg | ORAL_TABLET | Freq: Every day | ORAL | Status: DC
  Administered 2016-11-21 – 2016-11-23 (×4): 15 mg via ORAL
  Filled 2016-11-21 (×4): qty 1

## 2016-11-21 MED ORDER — DOCUSATE SODIUM 100 MG PO CAPS
100.0000 mg | ORAL_CAPSULE | Freq: Two times a day (BID) | ORAL | Status: DC
  Administered 2016-11-21 – 2016-11-23 (×6): 100 mg via ORAL
  Filled 2016-11-21 (×7): qty 1

## 2016-11-21 MED ORDER — CLONAZEPAM 0.5 MG PO TABS
0.5000 mg | ORAL_TABLET | Freq: Every day | ORAL | Status: DC
Start: 2016-11-21 — End: 2016-11-24
  Administered 2016-11-21 – 2016-11-23 (×4): 0.5 mg via ORAL
  Filled 2016-11-21 (×5): qty 1

## 2016-11-21 MED ORDER — CEFAZOLIN SODIUM-DEXTROSE 2-4 GM/100ML-% IV SOLN
2.0000 g | INTRAVENOUS | Status: AC
  Administered 2016-11-24: 2 g via INTRAVENOUS
  Filled 2016-11-21 (×2): qty 100

## 2016-11-21 MED ORDER — ACETAMINOPHEN 325 MG PO TABS: 650.0000 mg | ORAL_TABLET | Freq: Four times a day (QID) | ORAL | Status: DC | PRN

## 2016-11-21 NOTE — Evaluation (Signed)
Physical Therapy Evaluation Patient Details Name: Alexandra Riley MRN: [id number] DOB: 04-16-1922 Today's Date: 11/21/2016   History of Present Illness  Ltanya Bayley is a 83yo white female who comes to [facility] on 7/23 d/t continued pain in low back after sustaining a fall onto buttocks 1.5WA. PMH: OP, constipation, glaucoma, Rt Hip Fx s/p ORIF (2016). Pt is found to have L1 compression fracture, MRI reporting as 'benign looking' as well as other progressed degerative findings in the lumbar spine questionable for neurological involvement.  Pt is now scheduled for vertebroplasty on Friday 7/27 at [facility]. History is complicated by moderate HOH and circumlocution, but pt reports living alone with private caregiver services 12-4 M-F and additional family support on weekends. Pt performs mostly household distance AMB with RW and life alert. The patient has an extensive falls history including hip ORIF and STR. This appears to be her baseline after review of last PT notes from 2 admissions in 2016, at which point family then voided concerns about safety of patient and being able to provide adeuqate care.   Clinical Impression  Pt admitted with above diagnosis. Pt currently with functional limitations due to the deficits listed below (see "PT Problem List"). Upon entry, the patient is received semirecumbent in bed, no family/caregiver present. History is partially taken from the patient, limited by moderate HOH and circumlocution, and PT acute eval notes from 2016. Patient c/o of pain in low back 'not that bad' rated 3--4/10 when asked to use NPRS. She requires physical assistance for transfers from a low surface, but amb is performed slowly and steadily, safe RW use, no LOB, at supervision level. Pt reports to feel mildly weaker than baseline, but is willing to amb farther. Falls history is extensive, as documented when admitted in 2016 with hip fracture, at that time documented concern of patient safety at home. Pt has  positive indicative risk factors for frequent falls in home that are unlikely to change with reasonable PT intervention at this time, including but not limited to rigid thoracic kyphosis altering function of the vestibular system and distribution of COG over BOS, and Gait speed <0.23m/s unlikely to increase beyond the 0.9m/s cutoff score. PR recommending transition to higher level of care, LTC ALF<->SNF to address safety concerns pertaining to baseline functional level. This can be pursed by patient/family at DC ad lib. STR is not recommended at this time due to low anticipated improvement in function: this is based largely on chronicity of baseline level and goals/desires of the patient. Pt will benefit from skilled PT intervention to increase independence and safety with basic mobility in preparation for discharge to the venue listed below.       Follow Up Recommendations Home health PT;Supervision for mobility/OOB (pt is very near baseline for mobility, really not appropriate for high-frequency/high-intensity PT intervention. )    Equipment Recommendations  None recommended by PT    Recommendations for Other Services       Precautions / Restrictions Precautions Precautions:  (no current fall score available, but pt has hx of freqeunt falls. ) Precaution Comments: not currently on back precautions, but may benefit for comfort.  Restrictions Weight Bearing Restrictions: No      Mobility  Bed Mobility Overal bed mobility: Modified Independent Bed Mobility: Supine to Sit     Supine to sit: Modified independent (Device/Increase time)     General bed mobility comments: assistance to detach SCD  Transfers Overall transfer level: Needs assistance Equipment used: Rolling walker (2 wheeled) Transfers:  Sit to/from Stand Sit to Stand: Mod assist         General transfer comment: modA required from low surfaces (toilet and geri chair)   Ambulation/Gait Ambulation/Gait assistance:  Supervision Ambulation Distance (Feet): 75 Feet Assistive device: Rolling walker (2 wheeled)   Gait velocity: <0.25 Gait velocity interpretation: <1.8 ft/sec, indicative of risk for recurrent falls General Gait Details: slow, forward flexed due to kypohisis, maintains safe use of RW   Stairs            Wheelchair Mobility    Modified Rankin (Stroke Patients Only)       Balance Overall balance assessment: History of Falls;Modified Independent                                           Pertinent Vitals/Pain Pain Assessment: 0-10 Pain Score: 4  Pain Location: low back pain  Pain Descriptors / Indicators: Aching Pain Intervention(s): Limited activity within patient's tolerance;Monitored during session;Repositioned    Home Living Family/patient expects to be discharged to:: Assisted living Living Arrangements: Alone             Home Equipment: Dan HumphreysWalker - 2 wheels      Prior Function Level of Independence: Needs assistance   Gait / Transfers Assistance Needed: amb access of house with RW ad lib.   ADL's / Homemaking Assistance Needed: toilets and dressess self, assistance for home making. was sponge bathing 2YA, but unclear at this point.         Hand Dominance   Dominant Hand: Right    Extremity/Trunk Assessment        Lower Extremity Assessment Lower Extremity Assessment: Generalized weakness;Overall [facility] Inst[facility]FL for tasks assessed    Cervical / Trunk Assessment Cervical / Trunk Assessment: Kyphotic (middle to upper thoracic kyphosis with lumbar to lower thoracic flat-back )  Communication   Communication: HOH  Cognition Arousal/Alertness: Awake/alert Behavior During Therapy: WFL for tasks assessed/performed Overall Cognitive Status: Within Functional Limits for tasks assessed                                        General Comments      Exercises     Assessment/Plan    PT Assessment Patient needs continued PT  services  PT Problem List Decreased strength;Decreased range of motion;Decreased activity tolerance;Decreased knowledge of precautions;Pain       PT Treatment Interventions Gait training;Functional mobility training;Therapeutic exercise;Therapeutic activities;Balance training;Patient/family education    PT Goals (Current goals can be found in the Care Plan section)       Frequency Min 2X/week   Barriers to discharge        Co-evaluation               AM-PAC PT "6 Clicks" Daily Activity  Outcome Measure Difficulty turning over in bed (including adjusting bedclothes, sheets and blankets)?: A Little Difficulty moving from lying on back to sitting on the side of the bed? : A Little Difficulty sitting down on and standing up from a chair with arms (e.g., wheelchair, bedside commode, etc,.)?: Total Help needed moving to and from a bed to chair (including a wheelchair)?: Total Help needed walking in hospital room?: A Little Help needed climbing 3-5 steps with a railing? : A Lot 6 Click Score: 13  End of Session Equipment Utilized During Treatment: Gait belt Activity Tolerance: Patient tolerated treatment well;No increased pain Patient left: in chair;with call bell/phone within reach;with nursing/sitter in room;with SCD's reapplied Nurse Communication: Mobility status;Other (comment) (likely to stay until procedure ) PT Visit Diagnosis: History of falling (Z91.81);Unsteadiness on feet (R26.81);Difficulty in walking, not elsewhere classified (R26.2)    Time: 4098-1191 PT Time Calculation (min) (ACUTE ONLY): 25 min   Charges:   PT Evaluation $PT Eval Low Complexity: 1 Procedure PT Treatments $Therapeutic Activity: 8-22 mins   PT G Codes:   PT G-Codes **NOT FOR INPATIENT CLASS** Functional Assessment Tool Used: AM-PAC 6 Clicks Basic Mobility Functional Limitation: Mobility: Walking and moving around Mobility: Walking and Moving Around Current Status (Y7829): At least 40  percent but less than 60 percent impaired, limited or restricted Mobility: Walking and Moving Around Goal Status [phone]): At least 40 percent but less than 60 percent impaired, limited or restricted    3:37 PM, 11/21/16 Rosamaria Lints, PT, DPT Physical Therapist - [place] [phone] (Office)   Buccola,Allan C 11/21/2016, 3:22 PM

## 2016-11-21 NOTE — Progress Notes (Signed)
PROGRESS NOTE    Alexandra Riley  ZOX:[id number] DOB: [birth year] DOA: 11/20/2016 PCP: Bernerd Limbo, MD    Brief Narrative:  83 year old female who presented with abdominal pain and back pain. Patient is known to have osteoporosis, dyslipidemia, and glaucoma. Apparently she has been experiencing abdominal pain, associated poor appetite, generalized weakness. She sustained a mechanical fall about 10 days ago, experiencing severe back pain, difficulty ambulating. On initial physical examination blood pressure 171/71, heart rate 78, respiratory rate 18, oxygen saturation 98%. Moist mucous membranes, her lungs are clear to auscultation bilaterally, heart S1-S2 present and rhythmic, abdomen was distended and tender to palpation, no lower extremity edema. CT showed L1 compression fracture new compared to films from March 2017.  Patient admitted to hospital with severe back pain complicated by ambulatory dysfunction, related to acute L1 compression fracture.   Assessment & Plan:   Principal Problem:   Compression fracture of L1 lumbar vertebra, closed, initial encounter (HCC) Active Problems:   Abdominal pain  1. L1 compression fracture. Will continue pain control with hydrocodone and IV morphine, will follow with physical therapy recommendations. IR schedule for possible kyphoplasty, 11/24/2016  2. Ambulatory dysfunction. Out of bed as tolerated, continue pain control   3. Anxiety. Continue clonazepam, per home regimen.     DVT prophylaxis: enoxaparin  Code Status: DNR Family Communication: No family at the bedside  Disposition Plan: home   Consultants:   IR   Procedures:    Antimicrobials:   Subjective: Patient with back pain, moderate to severe, worse with movement, associated with poor appetite. Abdominal pain, lower abdomen, colic and dull in nature, no diarrhea.   Objective: Vitals:   11/21/16 0030 11/21/16 0041 11/21/16 0135 11/21/16 0510  BP: 126/63 127/60  (!) 140/58 (!) 160/58  Pulse: 73 67 69   Resp:  18 18 18   Temp:   98 F (36.7 C) 98.3 F (36.8 C)  TempSrc:   Oral   SpO2: 94% 93% 100% 98%  Weight:   52.1 kg (114 lb 13.8 oz)   Height:   5\' 5"  (1.651 m)     Intake/Output Summary (Last 24 hours) at 11/21/16 1350 Last data filed at 11/21/16 0900  Gross per 24 hour  Intake              500 ml  Output                0 ml  Net              500 ml   Filed Weights   11/20/16 1630 11/21/16 0135  Weight: 51.7 kg (114 lb) 52.1 kg (114 lb 13.8 oz)    Examination:  General exam: deconditioned E ENT. No pallor or icterus, oral mucosa moist Respiratory system: Clear to auscultation. Respiratory effort normal. No wheezing Cardiovascular system: S1 & S2 heard, RRR. No JVD, murmurs, rubs, gallops or clicks. No pedal edema. Gastrointestinal system: Abdomen has mild distention, soft and nontender. No organomegaly or masses felt. Normal bowel sounds heard. Central nervous system: Alert and oriented. No focal neurological deficits. Extremities: Symmetric 5 x 5 power. Skin: No rashes, lesions or ulcers     Data Reviewed: I have personally reviewed following labs and imaging studies  CBC:  Recent Labs Lab 11/20/16 1723 11/21/16 0659  WBC 10.9* 8.0  NEUTROABS 8.9*  --   HGB 12.7 11.7*  HCT 40.4 37.3  MCV 94.4 94.7  PLT 205 195   Basic Metabolic Panel:  Recent Labs  Lab 11/20/16 1723 11/21/16 0659  NA 142 145  K 4.1 3.8  CL 109 111  CO2 23 27  GLUCOSE 92 94  BUN 13 8  CREATININE 1.10* 0.91  CALCIUM 9.7 9.1   GFR: Estimated Creatinine Clearance: 32.4 mL/min (by C-G formula based on SCr of 0.91 mg/dL). Liver Function Tests:  Recent Labs Lab 11/20/16 1723  AST 17  ALT 13*  ALKPHOS 69  BILITOT 0.6  PROT 7.1  ALBUMIN 4.0    Recent Labs Lab 11/20/16 1723  LIPASE 55*   No results for input(s): AMMONIA in the last 168 hours. Coagulation Profile: No results for input(s): INR, PROTIME in the last 168  hours. Cardiac Enzymes: No results for input(s): CKTOTAL, CKMB, CKMBINDEX, TROPONINI in the last 168 hours. BNP (last 3 results) No results for input(s): PROBNP in the last 8760 hours. HbA1C: No results for input(s): HGBA1C in the last 72 hours. CBG: No results for input(s): GLUCAP in the last 168 hours. Lipid Profile: No results for input(s): CHOL, HDL, LDLCALC, TRIG, CHOLHDL, LDLDIRECT in the last 72 hours. Thyroid Function Tests: No results for input(s): TSH, T4TOTAL, FREET4, T3FREE, THYROIDAB in the last 72 hours. Anemia Panel: No results for input(s): VITAMINB12, FOLATE, FERRITIN, TIBC, IRON, RETICCTPCT in the last 72 hours. Sepsis Labs:  Recent Labs Lab 11/20/16 1754  LATICACIDVEN 1.00    No results found for this or any previous visit (from the past 240 hour(s)).       Radiology Studies: Mr Lumbar Spine Wo Contrast  Result Date: 11/21/2016 CLINICAL DATA:  Low back pain and bilateral leg weakness. The patient fell from her bed 1 week ago. EXAM: MRI LUMBAR SPINE WITHOUT CONTRAST TECHNIQUE: Multiplanar, multisequence MR imaging of the lumbar spine was performed. No intravenous contrast was administered. COMPARISON:  CT scan dated 11/20/2016 and 07/20/2015 and radiographs dated 11/11/2014 FINDINGS: Segmentation:  Standard. Alignment: 2 mm spondylolisthesis at L2-3, chronic, unchanged since 07/20/2015. Vertebrae: There is an acute mild compression fracture of the superior endplate of L1 as demonstrated on the recent CT scan. There is no protrusion of bone into the spinal canal. Conus medullaris: Extends to the T12-L1 level and appears normal. Paraspinal and other soft tissues: Negative. Disc levels: T12-L1: Tiny central disc bulge without neural impingement, unchanged since 07/20/2015. No disc protrusion. Benign-appearing mild compression fracture of the superior endplate of L1 with no bone protrusion into the spinal canal. L1-2: Chronic broad-based disc protrusion slightly  asymmetric to the left slightly compressing the left side of the thecal sac, unchanged since the prior CT scan of 07/20/2015. L2-3: Grade 1 spondylolisthesis with disc space narrowing with a small broad-based soft disc protrusion. Hypertrophy of the ligamentum flavum combine to create severe spinal stenosis. This has progressed since the prior study of 07/20/2015. Both lateral recesses are compressed, left more than right. L3-4: Chronic central soft disc extrusion extending superiorly behind the body of L3 in the midline with. Slight hypertrophy of the ligamentum flavum. Marked narrowing of the right lateral recess which could affect the right L4 nerve. These findings appear slightly more prominent than on the prior CT scan of 07/20/2015. Severe compression of the thecal sac. L4-5: Slight bulge of the disc and lateral to the right neural foramen without neural impingement. Minimal hypertrophy of the ligamentum flavum. No spinal stenosis. L5-S1: Slight disc desiccation without disc bulging or protrusion. No neural impingement. IMPRESSION: 1. Acute benign-appearing mild compression fracture of the superior endplate of L1 without neural impingement. 2. Severe spinal stenosis at  L2-3 with lateral recess compression, left greater than right. 3. Severe compression of the thecal sac at L3-4 due to a chronic central disc extrusion, slightly progressed since 2017. Right lateral recess compression at L3-4. Electronically Signed   By: Francene Boyers M.D.   On: 11/21/2016 10:30   Ct Abdomen Pelvis W Contrast  Result Date: 11/20/2016 CLINICAL DATA:  falling at her bedside 1 week ago when she lost balance. Pt says she hurts "all over" and mostly in abdomen, back and side 10/10 pain. Pt says she feels constipated and drinks prune juice with no relief. LBM yesterday but was mostly loose EXAM: CT ABDOMEN AND PELVIS WITH CONTRAST TECHNIQUE: Multidetector CT imaging of the abdomen and pelvis was performed using the standard  protocol following bolus administration of intravenous contrast. CONTRAST:  75mL ISOVUE-300 IOPAMIDOL (ISOVUE-300) INJECTION 61% COMPARISON:  07/20/2015 FINDINGS: Lower chest: Heavy mitral annulus calcifications. Atheromatous aorta. Hepatobiliary: No focal liver abnormality is seen. No gallstones, gallbladder wall thickening, or biliary dilatation. Pancreas: Unremarkable. No pancreatic ductal dilatation or surrounding inflammatory changes. Spleen: Normal in size without focal abnormality. Adrenals/Urinary Tract: Normal adrenals. Prominent extrarenal pelves left greater than right. No nephrolithiasis. No focal renal lesion. Some imaging degradation secondary to breathing motion. Stomach/Bowel: Stomach, small bowel, and colon are nondilated. The rectum is distended by fluid and gas. No wall thickening or regional inflammatory change. Scattered sigmoid diverticula. Vascular/Lymphatic: Moderate aortoiliac arterial calcifications without aneurysm or stenosis. Portal vein patent. No abdominal or pelvic adenopathy localized. Multiple pelvic phleboliths. Reproductive: Uterus and bilateral adnexa are unremarkable. Other: No ascites.  No free air. Musculoskeletal: Mild L1 compression deformity with less than 20% loss of height, no retropulsion, new since previous study. Old Schmorl's node in T11. Fixation hardware in the right femur. No worrisome bone lesion. IMPRESSION: 1. L1 compression fracture deformity, new since 07/20/2015, without complicating features. 2. No acute intraabdominal process. 3. Sigmoid diverticulosis. 4.  Aortic Atherosclerosis (ICD10-170.0) Electronically Signed   By: Corlis Leak M.D.   On: 11/20/2016 20:42        Scheduled Meds: . aspirin EC  81 mg Oral q morning - 10a  . clonazePAM  0.5 mg Oral QHS  . docusate sodium  100 mg Oral BID  . lidocaine  1 patch Transdermal Q24H  . mirtazapine  15 mg Oral QHS  . nicotine  14 mg Transdermal Daily  . polyethylene glycol  17 g Oral Daily    Continuous Infusions: . sodium chloride 50 mL/hr at 11/21/16 0248  . [START ON 11/24/2016]  ceFAZolin (ANCEF) IV       LOS: 0 days        Jaegar Croft Annett Gula, MD [facility] Pager [phone]  If 7PM-7AM, please contact night-coverage [email or web address] Password [email or web address] 11/21/2016, 1:50 PM

## 2016-11-21 NOTE — Care Management Obs Status (Signed)
MEDICARE OBSERVATION STATUS NOTIFICATION   Patient Details  Name: Alexandra Riley MRN: [id number] Date of Birth: Mar 30, 1922   Medicare Observation Status Notification Given:  Yes    Malcolm MetroChildress, Lynnea Vandervoort Demske, RN 11/21/2016, 1:32 PM

## 2016-11-21 NOTE — Progress Notes (Signed)
IR aware of request for possible VP/KP.  Patient will need an MRI to evaluate this area prior to determining if she is a candidate for the procedure or not.  The MRI has been ordered.  She will also need insurance authorization.  [facility] this to see if insurance would approve a procedure if determined appropriate.  Patient can eat today as this will not be done today.  Daemion Mcniel E 7:39 AM 11/21/2016

## 2016-11-21 NOTE — Progress Notes (Signed)
Patient has been approved for L1 VP/KP.  We will plan for her procedure on Friday at 1130 am.  [facility]link will need to be arranged by nursing staff to [facility].  Discussed with Dr. Ella JubileeArrien.  Safina Huard E 1:22 PM 11/21/2016

## 2016-11-21 NOTE — Progress Notes (Signed)
LCSW has been consulted for placement.  PT recommendations are not for ST placement at this time. Home Health being recommended.   Attempted to call patient's daughter Edwyna ShellV Woods (number in chart)  and was unable to reach to discuss option regarding private pay. At this time will sign off due to recommendation not being for short term placement.  Deretha EmoryHannah Kailin Principato LCSW, MSW Clinical Social Work: Optician, dispensingystem Wide Float Coverage for :  [phone]

## 2016-11-22 DIAGNOSIS — S32010A Wedge compression fracture of first lumbar vertebra, initial encounter for closed fracture: Secondary | ICD-10-CM | POA: Diagnosis not present

## 2016-11-22 LAB — CBC WITH DIFFERENTIAL/PLATELET
BASOS ABS: 0 10*3/uL (ref 0.0–0.1)
Basophils Relative: 0 %
Eosinophils Absolute: 0.2 10*3/uL (ref 0.0–0.7)
Eosinophils Relative: 2 %
HEMATOCRIT: 39.1 % (ref 36.0–46.0)
HEMOGLOBIN: 12.3 g/dL (ref 12.0–15.0)
LYMPHS PCT: 23 %
Lymphs Abs: 1.9 10*3/uL (ref 0.7–4.0)
MCH: 29.9 pg (ref 26.0–34.0)
MCHC: 31.5 g/dL (ref 30.0–36.0)
MCV: 94.9 fL (ref 78.0–100.0)
Monocytes Absolute: 0.9 10*3/uL (ref 0.1–1.0)
Monocytes Relative: 11 %
NEUTROS ABS: 5.3 10*3/uL (ref 1.7–7.7)
NEUTROS PCT: 64 %
Platelets: 198 10*3/uL (ref 150–400)
RBC: 4.12 MIL/uL (ref 3.87–5.11)
RDW: 13.8 % (ref 11.5–15.5)
WBC: 8.3 10*3/uL (ref 4.0–10.5)

## 2016-11-22 LAB — BASIC METABOLIC PANEL
ANION GAP: 8 (ref 5–15)
BUN: 11 mg/dL (ref 6–20)
CO2: 26 mmol/L (ref 22–32)
Calcium: 9 mg/dL (ref 8.9–10.3)
Chloride: 109 mmol/L (ref 101–111)
Creatinine, Ser: 0.82 mg/dL (ref 0.44–1.00)
GFR calc Af Amer: >60 mL/min (ref >60)
GFR calc non Af Amer: >60 mL/min (ref >60)
GLUCOSE: 94 mg/dL (ref 65–99)
POTASSIUM: 3.6 mmol/L (ref 3.5–5.1)
Sodium: 143 mmol/L (ref 135–145)

## 2016-11-22 LAB — PROTIME-INR
INR: 0.98
Prothrombin Time: 13 seconds (ref 11.4–15.2)

## 2016-11-22 LAB — URINE CULTURE
Culture: <10000 — AB
Special Requests: NORMAL

## 2016-11-22 MED ORDER — HYDROCODONE-ACETAMINOPHEN 5-325 MG PO TABS: 1.0000 | ORAL_TABLET | Freq: Four times a day (QID) | ORAL | 0 refills | Status: DC | PRN

## 2016-11-22 MED ORDER — POLYETHYLENE GLYCOL 3350 17 G PO PACK: 17.0000 g | PACK | Freq: Every day | ORAL | 0 refills | Status: AC

## 2016-11-22 MED ORDER — CLONAZEPAM 0.5 MG PO TABS: 0.5000 mg | ORAL_TABLET | Freq: Every day | ORAL | 0 refills | Status: DC

## 2016-11-22 NOTE — Care Management Note (Signed)
Case Management Note  Patient Details  Name: Alexandra Riley MRN: [id number] Date of Birth: [birth year]  Subjective/Objective:                  Admitted with with compression fx. She is from home, lives in her own home. She ambulates ind at baseline. She has aid M-F 12-4pm and daughter comes in the afternoon to prepare evening meal. Pt has RW pta. Pt's daughter very involved and was hopeful for STR. PT has recommended HH PT with LT placement if possible. Per daughter she will keep pt in her own home at her mother's request. [facility] not a [facility]. She prefers [facility] be referred. Olegario MessierKathy, [facility], aware of referral. She is aware HH has 48 hrs to make first visit. [facility]   Action/Plan: Anticipate DC home with HH after kyphoplasty. [facility] rep will obtain pt info from chart and follow pt for DC. CM will cont to follow after [facility].   Expected Discharge Date:  11/22/16               Expected Discharge Plan:  Home w Home Health Services  In-House Referral:  NA  Discharge planning Services  CM Consult  Post Acute Care Choice:  Home Health Choice offered to:  Patient, Adult Children  HH Arranged[facility]  [facility]  [facility]  Status of Service:  In process, will continue to follow  Malcolm MetroChildress, Ninette Cotta Demske, RN 11/22/2016, 1:54 PM

## 2016-11-22 NOTE — Progress Notes (Signed)
New Admission Note:  Arrival Method[facility] Mental Orientation: Alert & oriented  Telemetry:n./a Assessment: Completed Skin: Pt has bruising on L leg, [facility]  IV: R AC./Saline locked  Pain:9/10- pt medicated  Safety Measures: Safety Fall Prevention Plan was given, discussed. Admission: Completed 5M01: Patient has been orientated to the room, unit and the staff. Family: updated   Orders have been reviewed and implemented. Will continue to monitor the patient. Call light has been placed within reach and bed alarm has been activated.   Alexandra IonYari Justeen Hehr ,RN

## 2016-11-22 NOTE — Progress Notes (Signed)
[facility] PROGRESS NOTE    Progress Note  Alexandra Riley  ONG:[id number] DOB: 1921/06/30 DOA: 11/20/2016 PCP: Bernerd LimboAriza, Fernando Enrique, MD     Brief Narrative:   Alexandra Riley is an 83 y.o. female history of osteoporosis and glaucoma, who presents with abdominal pain and back pain, CT scan of the abdomen pelvis performed on 11/20/2016 showed diffuse L1 compression fracture, MRI was done that showed as below.  Assessment/Plan:   Compression fracture of L1 lumbar vertebra, closed, initial encounter (HCC) MRI of lumbar: Acute benign-appearing mild new compression L1 fracture without neural impingement,  Severe spinal stenosis at L2-3 with lateral recess compression,left greater than right. Physical therapy evaluated the patient and recommended home health PT. Continue to collect for pain orally as an outpatient. IR us consulted and recommended kyphoplasty on 11/23/2016. We'll transfer to [facility]  Anxiety: Continue current regimen.  DVT prophylaxis: lovenox Family Communication: No family at bedside Disposition Plan/Barrier to D/C: no  Code Status:     Code Status Orders        Start     Ordered   11/21/16 0146  Do not attempt resuscitation (DNR)  Continuous    Question Answer Comment  In the event of cardiac or respiratory ARREST Do not call a "code blue"   In the event of cardiac or respiratory ARREST Do not perform Intubation, CPR, defibrillation or ACLS   In the event of cardiac or respiratory ARREST Use medication by any route, position, wound care, and other measures to relive pain and suffering. May use oxygen, suction and manual treatment of airway obstruction as needed for comfort.      11/21/16 0145    Code Status History    Date Active Date Inactive Code Status Order ID Comments User Context   11/11/2014  1:48 PM 11/13/2014  2:09 PM DNR [id number]  Leatha GildingGherghe, Costin M, MD ED   05/26/2014  9:41 PM 06/01/2014  8:10 PM Full Code [id number]  Doree AlbeeNewton, Steven, MD ED   12/28/2011  4:04 PM 01/03/2012  3:41 PM Full Code [id number]  Fabio BeringZiegler, Brent C, MD ED        IV Access:    Peripheral IV   Procedures and diagnostic studies:   Mr Lumbar Spine Wo Contrast  Result Date: 11/21/2016 CLINICAL DATA:  Low back pain and bilateral leg weakness. The patient fell from her bed 1 week ago. EXAM: MRI LUMBAR SPINE WITHOUT CONTRAST TECHNIQUE: Multiplanar, multisequence MR imaging of the lumbar spine was performed. No intravenous contrast was administered. COMPARISON:  CT scan dated 11/20/2016 and 07/20/2015 and radiographs dated 11/11/2014 FINDINGS: Segmentation:  Standard. Alignment: 2 mm spondylolisthesis at L2-3, chronic, unchanged since 07/20/2015. Vertebrae: There is an acute mild compression fracture of the superior endplate of L1 as demonstrated on the recent CT scan. There is no protrusion of bone into the spinal canal. Conus medullaris: Extends to the T12-L1 level and appears normal. Paraspinal and other soft tissues: Negative. Disc levels: T12-L1: Tiny central disc bulge without neural impingement, unchanged since 07/20/2015. No disc protrusion. Benign-appearing mild compression fracture of the superior endplate of L1 with no bone protrusion into the spinal canal. L1-2: Chronic broad-based disc protrusion slightly asymmetric to the left slightly compressing the left side of the thecal sac, unchanged since the prior CT scan of 07/20/2015. L2-3: Grade 1 spondylolisthesis with disc space narrowing with a small broad-based soft disc protrusion. Hypertrophy of the ligamentum flavum combine to create severe spinal stenosis. This has progressed since the prior study  of 07/20/2015. Both lateral recesses are compressed, left more than right. L3-4: Chronic central soft disc extrusion extending superiorly behind the body of L3 in the midline with. Slight hypertrophy of the ligamentum flavum. Marked narrowing of the right lateral recess which could affect the right L4 nerve. These  findings appear slightly more prominent than on the prior CT scan of 07/20/2015. Severe compression of the thecal sac. L4-5: Slight bulge of the disc and lateral to the right neural foramen without neural impingement. Minimal hypertrophy of the ligamentum flavum. No spinal stenosis. L5-S1: Slight disc desiccation without disc bulging or protrusion. No neural impingement. IMPRESSION: 1. Acute benign-appearing mild compression fracture of the superior endplate of L1 without neural impingement. 2. Severe spinal stenosis at L2-3 with lateral recess compression, left greater than right. 3. Severe compression of the thecal sac at L3-4 due to a chronic central disc extrusion, slightly progressed since 2017. Right lateral recess compression at L3-4. Electronically Signed   By: Francene Boyers M.D.   On: 11/21/2016 10:30   Ct Abdomen Pelvis W Contrast  Result Date: 11/20/2016 CLINICAL DATA:  falling at her bedside 1 week ago when she lost balance. Pt says she hurts "all over" and mostly in abdomen, back and side 10/10 pain. Pt says she feels constipated and drinks prune juice with no relief. LBM yesterday but was mostly loose EXAM: CT ABDOMEN AND PELVIS WITH CONTRAST TECHNIQUE: Multidetector CT imaging of the abdomen and pelvis was performed using the standard protocol following bolus administration of intravenous contrast. CONTRAST:  75mL ISOVUE-300 IOPAMIDOL (ISOVUE-300) INJECTION 61% COMPARISON:  07/20/2015 FINDINGS: Lower chest: Heavy mitral annulus calcifications. Atheromatous aorta. Hepatobiliary: No focal liver abnormality is seen. No gallstones, gallbladder wall thickening, or biliary dilatation. Pancreas: Unremarkable. No pancreatic ductal dilatation or surrounding inflammatory changes. Spleen: Normal in size without focal abnormality. Adrenals/Urinary Tract: Normal adrenals. Prominent extrarenal pelves left greater than right. No nephrolithiasis. No focal renal lesion. Some imaging degradation secondary to  breathing motion. Stomach/Bowel: Stomach, small bowel, and colon are nondilated. The rectum is distended by fluid and gas. No wall thickening or regional inflammatory change. Scattered sigmoid diverticula. Vascular/Lymphatic: Moderate aortoiliac arterial calcifications without aneurysm or stenosis. Portal vein patent. No abdominal or pelvic adenopathy localized. Multiple pelvic phleboliths. Reproductive: Uterus and bilateral adnexa are unremarkable. Other: No ascites.  No free air. Musculoskeletal: Mild L1 compression deformity with less than 20% loss of height, no retropulsion, new since previous study. Old Schmorl's node in T11. Fixation hardware in the right femur. No worrisome bone lesion. IMPRESSION: 1. L1 compression fracture deformity, new since 07/20/2015, without complicating features. 2. No acute intraabdominal process. 3. Sigmoid diverticulosis. 4.  Aortic Atherosclerosis (ICD10-170.0) Electronically Signed   By: Corlis Leak M.D.   On: 11/20/2016 20:42     Medical Consultants:    None.  Anti-Infectives:   None  Subjective:    Alexandra Riley  she doesn't have pain is all she does move.  Objective:    Vitals:   11/21/16 0510 11/21/16 1457 11/21/16 2113 11/22/16 0500  BP: (!) 160/58 (!) 144/54 (!) 161/67 137/62  Pulse:  69 81 70  Resp: 18 20 20 18   Temp: 98.3 F (36.8 C) 98.2 F (36.8 C) (!) 97.4 F (36.3 C) 97.9 F (36.6 C)  TempSrc:  Oral Axillary Oral  SpO2: 98% 98% 100% 98%  Weight:      Height:        Intake/Output Summary (Last 24 hours) at 11/22/16 1128 Last data filed at  11/22/16 0715  Gross per 24 hour  Intake           1577.5 ml  Output             1900 ml  Net           -322.5 ml   Filed Weights   11/20/16 1630 11/21/16 0135  Weight: 51.7 kg (114 lb) 52.1 kg (114 lb 13.8 oz)    Exam: General exam: In no acute distress Respiratory system: Good air movement crit auscultation. Cardiovascular system: Regular rate and rhythm with positive S1-S2 no  murmurs rubs gallops. Gastrointestinal system: Abdomen soft nontender nondistended Central nervous system: Alert and oriented 3 Extremities: No lower extremity edema. Skin: No rashes. Psychiatry: Judgment and insight appeared normal.  Data Reviewed:    Labs: Basic Metabolic Panel:  Recent Labs Lab 11/20/16 1723 11/21/16 0659 11/22/16 0643  NA 142 145 143  K 4.1 3.8 3.6  CL 109 111 109  CO2 23 27 26   GLUCOSE 92 94 94  BUN 13 8 11   CREATININE 1.10* 0.91 0.82  CALCIUM 9.7 9.1 9.0   GFR Estimated Creatinine Clearance: 36 mL/min (by C-G formula based on SCr of 0.82 mg/dL). Liver Function Tests:  Recent Labs Lab 11/20/16 1723  AST 17  ALT 13*  ALKPHOS 69  BILITOT 0.6  PROT 7.1  ALBUMIN 4.0    Recent Labs Lab 11/20/16 1723  LIPASE 55*   No results for input(s): AMMONIA in the last 168 hours. Coagulation profile  Recent Labs Lab 11/22/16 0643  INR 0.98    CBC:  Recent Labs Lab 11/20/16 1723 11/21/16 0659 11/22/16 0643  WBC 10.9* 8.0 8.3  NEUTROABS 8.9*  --  5.3  HGB 12.7 11.7* 12.3  HCT 40.4 37.3 39.1  MCV 94.4 94.7 94.9  PLT 205 195 198   Cardiac Enzymes: No results for input(s): CKTOTAL, CKMB, CKMBINDEX, TROPONINI in the last 168 hours. BNP (last 3 results) No results for input(s): PROBNP in the last 8760 hours. CBG: No results for input(s): GLUCAP in the last 168 hours. D-Dimer: No results for input(s): DDIMER in the last 72 hours. Hgb A1c: No results for input(s): HGBA1C in the last 72 hours. Lipid Profile: No results for input(s): CHOL, HDL, LDLCALC, TRIG, CHOLHDL, LDLDIRECT in the last 72 hours. Thyroid function studies: No results for input(s): TSH, T4TOTAL, T3FREE, THYROIDAB in the last 72 hours.  Invalid input(s): FREET3 Anemia work up: No results for input(s): VITAMINB12, FOLATE, FERRITIN, TIBC, IRON, RETICCTPCT in the last 72 hours. Sepsis Labs:  Recent Labs Lab 11/20/16 1723 11/20/16 1754 11/21/16 0659 11/22/16 0643    WBC 10.9*  --  8.0 8.3  LATICACIDVEN  --  1.00  --   --    Microbiology Recent Results (from the past 240 hour(s))  Urine culture     Status: Abnormal   Collection Time: 11/20/16  5:06 PM  Result Value Ref Range Status   Specimen Description URINE, CLEAN CATCH  Final   Special Requests Normal  Final   Culture (A)  Final    <10,000 COLONIES/mL INSIGNIFICANT GROWTH Performed at [facility], [address] [place], [place] [address]    Report Status 11/22/2016 FINAL  Final     Medications:   . aspirin EC  81 mg Oral q morning - 10a  . clonazePAM  0.5 mg Oral QHS  . docusate sodium  100 mg Oral BID  . lidocaine  1 patch Transdermal Q24H  .  mirtazapine  15 mg Oral QHS  . nicotine  14 mg Transdermal Daily  . polyethylene glycol  17 g Oral Daily   Continuous Infusions: . sodium chloride 50 mL/hr at 11/22/16 0244  . [START ON 11/24/2016]  ceFAZolin (ANCEF) IV      LOS: 0 days   Marinda Elk  [facility] Pager [phone]  *Please refer to [email or web address], password [email or web address] to get updated schedule on who will round on this patient, as hospitalists switch teams weekly. If 7PM-7AM, please contact night-coverage at [email or web address], password [email or web address] for any overnight needs.  11/22/2016, 11:28 AM

## 2016-11-22 NOTE — Progress Notes (Signed)
[facility]. Pt's belongings sent with pt. Daughter in room when pt left. Pt in no apparent distress.

## 2016-11-22 NOTE — Discharge Summary (Deleted)
Physician Discharge Summary  Alexandra Riley:[id number] DOB: [birth year] DOA: 11/20/2016  PCP: Bernerd Limbo, MD  Admit date: 11/20/2016 Discharge date: 11/22/2016  Admitted From: Home Disposition:  Home  Recommendations for Outpatient Follow-up:  1. Follow up with PCP in 1-2 weeks 2. Return home with physical therapy.  Home Health:No Equipment/Devices:walker  Discharge Condition:stable CODE STATUS:DNR Diet recommendation: Regular  Brief/Interim Summary: 83 year old female with past medical history of osteoporosis constipation comes in for abdominal and back pain. The patient relates that she bent over making her bed when she lost her footing and fell on her buttock. Since then she's been having back pain.  Discharge Diagnoses:  Principal Problem:   Compression fracture of L1 lumbar vertebra, closed, initial encounter [facility]) Active Problems:   Abdominal pain  Compression fracture of L1 lumbar vertebra, closed, initial encounter (HCC) MRI of lumbar: Acute benign-appearing mild new compression L1 fracture without neural impingement,  Severe spinal stenosis at L2-3 with lateral recess compression,left greater than right. Physical therapy evaluated the patient and recommended home health PT. Continue to Narcotics for pain orally as an outpatient. she also take MiraLAX for duration of treatment.  Urine culture grew less than 100,000 colonies probably a contaminant. She continued to have regular bowel movements.  Discharge Instructions  Discharge Instructions    Face-to-face encounter (required for Medicare/Medicaid patients)    Complete by:  As directed    I David Stall, ABRAHAM certify that this patient is under my care and that I, or a nurse practitioner or physician's assistant working with me, had a face-to-face encounter that meets the physician face-to-face encounter requirements with this patient on 11/22/2016. The encounter with the patient was in whole, or in part  for the following medical condition(s) which is the primary reason for home health care (List medical condition): lumbar compression fracture   The encounter with the patient was in whole, or in part, for the following medical condition, which is the primary reason for home health care:  lumbar compression fracture   I certify that, based on my findings, the following services are medically necessary home health services:   Nursing Physical therapy     Reason for Medically Necessary Home Health Services:  Therapy- Investment banker, operational, Patent examiner   My clinical findings support the need for the above services:  Unsafe ambulation due to balance issues   Further, I certify that my clinical findings support that this patient is homebound due to:  Unable to leave home safely without assistance   Home Health    Complete by:  As directed    To provide the following care/treatments:   PT OT Home Health Aide       Allergies as of 11/22/2016   No Known Allergies     Medication List    STOP taking these medications   cephALEXin 500 MG capsule Commonly known as:  KEFLEX     TAKE these medications   aspirin EC 81 MG tablet Take 81 mg by mouth every morning.   atorvastatin 20 MG tablet Commonly known as:  LIPITOR   carisoprodol 350 MG tablet Commonly known as:  SOMA Take 350 mg by mouth 4 (four) times daily as needed for muscle spasms.   clonazePAM 0.5 MG tablet Commonly known as:  KLONOPIN Take 1 tablet (0.5 mg total) by mouth at bedtime. *May take one to two tablets daily as needed For anxiety Start taking on:  11/29/2016 What changed:  These instructions start on  11/29/2016. If you are unsure what to do until then, ask your doctor or other care provider.   HYDROcodone-acetaminophen 5-325 MG tablet Commonly known as:  NORCO/VICODIN Take 1 tablet by mouth every 6 (six) hours as needed for moderate pain (Must last 14 days.Do not take and drive a car or use  machinery.).   olopatadine 0.1 % ophthalmic solution Commonly known as:  PATANOL   ondansetron 4 MG tablet Commonly known as:  ZOFRAN Take 4 mg by mouth every 8 (eight) hours as needed for nausea or vomiting.   TRAVEL SICKNESS 50 MG tablet Generic drug:  dimenhyDRINATE Take 50 mg by mouth every 8 (eight) hours as needed.       No Known Allergies  Consultations:  None   Procedures/Studies: Mr Lumbar Spine Wo Contrast  Result Date: 11/21/2016 CLINICAL DATA:  Low back pain and bilateral leg weakness. The patient fell from her bed 1 week ago. EXAM: MRI LUMBAR SPINE WITHOUT CONTRAST TECHNIQUE: Multiplanar, multisequence MR imaging of the lumbar spine was performed. No intravenous contrast was administered. COMPARISON:  CT scan dated 11/20/2016 and 07/20/2015 and radiographs dated 11/11/2014 FINDINGS: Segmentation:  Standard. Alignment: 2 mm spondylolisthesis at L2-3, chronic, unchanged since 07/20/2015. Vertebrae: There is an acute mild compression fracture of the superior endplate of L1 as demonstrated on the recent CT scan. There is no protrusion of bone into the spinal canal. Conus medullaris: Extends to the T12-L1 level and appears normal. Paraspinal and other soft tissues: Negative. Disc levels: T12-L1: Tiny central disc bulge without neural impingement, unchanged since 07/20/2015. No disc protrusion. Benign-appearing mild compression fracture of the superior endplate of L1 with no bone protrusion into the spinal canal. L1-2: Chronic broad-based disc protrusion slightly asymmetric to the left slightly compressing the left side of the thecal sac, unchanged since the prior CT scan of 07/20/2015. L2-3: Grade 1 spondylolisthesis with disc space narrowing with a small broad-based soft disc protrusion. Hypertrophy of the ligamentum flavum combine to create severe spinal stenosis. This has progressed since the prior study of 07/20/2015. Both lateral recesses are compressed, left more than right.  L3-4: Chronic central soft disc extrusion extending superiorly behind the body of L3 in the midline with. Slight hypertrophy of the ligamentum flavum. Marked narrowing of the right lateral recess which could affect the right L4 nerve. These findings appear slightly more prominent than on the prior CT scan of 07/20/2015. Severe compression of the thecal sac. L4-5: Slight bulge of the disc and lateral to the right neural foramen without neural impingement. Minimal hypertrophy of the ligamentum flavum. No spinal stenosis. L5-S1: Slight disc desiccation without disc bulging or protrusion. No neural impingement. IMPRESSION: 1. Acute benign-appearing mild compression fracture of the superior endplate of L1 without neural impingement. 2. Severe spinal stenosis at L2-3 with lateral recess compression, left greater than right. 3. Severe compression of the thecal sac at L3-4 due to a chronic central disc extrusion, slightly progressed since 2017. Right lateral recess compression at L3-4. Electronically Signed   By: Francene Boyers M.D.   On: 11/21/2016 10:30   Ct Abdomen Pelvis W Contrast  Result Date: 11/20/2016 CLINICAL DATA:  falling at her bedside 1 week ago when she lost balance. Pt says she hurts "all over" and mostly in abdomen, back and side 10/10 pain. Pt says she feels constipated and drinks prune juice with no relief. LBM yesterday but was mostly loose EXAM: CT ABDOMEN AND PELVIS WITH CONTRAST TECHNIQUE: Multidetector CT imaging of the abdomen and pelvis was  performed using the standard protocol following bolus administration of intravenous contrast. CONTRAST:  75mL ISOVUE-300 IOPAMIDOL (ISOVUE-300) INJECTION 61% COMPARISON:  07/20/2015 FINDINGS: Lower chest: Heavy mitral annulus calcifications. Atheromatous aorta. Hepatobiliary: No focal liver abnormality is seen. No gallstones, gallbladder wall thickening, or biliary dilatation. Pancreas: Unremarkable. No pancreatic ductal dilatation or surrounding  inflammatory changes. Spleen: Normal in size without focal abnormality. Adrenals/Urinary Tract: Normal adrenals. Prominent extrarenal pelves left greater than right. No nephrolithiasis. No focal renal lesion. Some imaging degradation secondary to breathing motion. Stomach/Bowel: Stomach, small bowel, and colon are nondilated. The rectum is distended by fluid and gas. No wall thickening or regional inflammatory change. Scattered sigmoid diverticula. Vascular/Lymphatic: Moderate aortoiliac arterial calcifications without aneurysm or stenosis. Portal vein patent. No abdominal or pelvic adenopathy localized. Multiple pelvic phleboliths. Reproductive: Uterus and bilateral adnexa are unremarkable. Other: No ascites.  No free air. Musculoskeletal: Mild L1 compression deformity with less than 20% loss of height, no retropulsion, new since previous study. Old Schmorl's node in T11. Fixation hardware in the right femur. No worrisome bone lesion. IMPRESSION: 1. L1 compression fracture deformity, new since 07/20/2015, without complicating features. 2. No acute intraabdominal process. 3. Sigmoid diverticulosis. 4.  Aortic Atherosclerosis (ICD10-170.0) Electronically Signed   By: Corlis Leak  Hassell M.D.   On: 11/20/2016 20:42   Dg Knee 3 Views Right  Result Date: 11/09/2016 Clinical:  Right knee pain, no trauma X-rays were done of the right knee, three views. There is mild degenerative changes of the right knee with more medial narrowing.  No fracture or loose body is present.  Bone quality is good. Impression:  Mild degenerative changes of the right knee. Electronically Signed Darreld McleanWayne Keeling, MD 7/12/20188:05 AM     Subjective:   no complains.  Discharge Exam: Vitals:   11/21/16 2113 11/22/16 0500  BP: (!) 161/67 137/62  Pulse: 81 70  Resp: 20 18  Temp: (!) 97.4 F (36.3 C) 97.9 F (36.6 C)   Vitals:   11/21/16 0510 11/21/16 1457 11/21/16 2113 11/22/16 0500  BP: (!) 160/58 (!) 144/54 (!) 161/67 137/62  Pulse:  69  81 70  Resp: 18 20 20 18   Temp: 98.3 F (36.8 C) 98.2 F (36.8 C) (!) 97.4 F (36.3 C) 97.9 F (36.6 C)  TempSrc:  Oral Axillary Oral  SpO2: 98% 98% 100% 98%  Weight:      Height:        General: Pt is alert, awake, not in acute distress Cardiovascular: RRR, S1/S2 +, no rubs, no gallops Respiratory: CTA bilaterally, no wheezing, no rhonchi Abdominal: Soft, NT, ND, bowel sounds + Extremities: no edema, no cyanosis    The results of significant diagnostics from this hospitalization (including imaging, microbiology, ancillary and laboratory) are listed below for reference.     Microbiology: Recent Results (from the past 240 hour(s))  Urine culture     Status: Abnormal   Collection Time: 11/20/16  5:06 PM  Result Value Ref Range Status   Specimen Description URINE, CLEAN CATCH  Final   Special Requests Normal  Final   Culture (A)  Final    <10,000 COLONIES/mL INSIGNIFICANT GROWTH Performed at [facility], [address] [place], [place] [address]    Report Status 11/22/2016 FINAL  Final     Labs: BNP (last 3 results) No results for input(s): BNP in the last 8760 hours. Basic Metabolic Panel:  Recent Labs Lab 11/20/16 1723 11/21/16 0659 11/22/16 0643  NA 142 145 143  K 4.1 3.8 3.6  CL 109 111 109  CO2 23 27 26   GLUCOSE 92 94 94  BUN 13 8 11   CREATININE 1.10* 0.91 0.82  CALCIUM 9.7 9.1 9.0   Liver Function Tests:  Recent Labs Lab 11/20/16 1723  AST 17  ALT 13*  ALKPHOS 69  BILITOT 0.6  PROT 7.1  ALBUMIN 4.0    Recent Labs Lab 11/20/16 1723  LIPASE 55*   No results for input(s): AMMONIA in the last 168 hours. CBC:  Recent Labs Lab 11/20/16 1723 11/21/16 0659 11/22/16 0643  WBC 10.9* 8.0 8.3  NEUTROABS 8.9*  --  5.3  HGB 12.7 11.7* 12.3  HCT 40.4 37.3 39.1  MCV 94.4 94.7 94.9  PLT 205 195 198   Cardiac Enzymes: No results for input(s): CKTOTAL, CKMB, CKMBINDEX, TROPONINI in the last 168 hours. BNP: Invalid input(s):  POCBNP CBG: No results for input(s): GLUCAP in the last 168 hours. D-Dimer No results for input(s): DDIMER in the last 72 hours. Hgb A1c No results for input(s): HGBA1C in the last 72 hours. Lipid Profile No results for input(s): CHOL, HDL, LDLCALC, TRIG, CHOLHDL, LDLDIRECT in the last 72 hours. Thyroid function studies No results for input(s): TSH, T4TOTAL, T3FREE, THYROIDAB in the last 72 hours.  Invalid input(s): FREET3 Anemia work up No results for input(s): VITAMINB12, FOLATE, FERRITIN, TIBC, IRON, RETICCTPCT in the last 72 hours. Urinalysis    Component Value Date/Time   COLORURINE AMBER (A) 11/20/2016 1705   APPEARANCEUR CLEAR 11/20/2016 1705   LABSPEC 1.009 11/20/2016 1705   PHURINE 5.0 11/20/2016 1705   GLUCOSEU NEGATIVE 11/20/2016 1705   HGBUR SMALL (A) 11/20/2016 1705   BILIRUBINUR NEGATIVE 11/20/2016 1705   KETONESUR NEGATIVE 11/20/2016 1705   PROTEINUR NEGATIVE 11/20/2016 1705   UROBILINOGEN 0.2 11/11/2014 0910   NITRITE NEGATIVE 11/20/2016 1705   LEUKOCYTESUR TRACE (A) 11/20/2016 1705   Sepsis Labs Invalid input(s): PROCALCITONIN,  WBC,  LACTICIDVEN Microbiology Recent Results (from the past 240 hour(s))  Urine culture     Status: Abnormal   Collection Time: 11/20/16  5:06 PM  Result Value Ref Range Status   Specimen Description URINE, CLEAN CATCH  Final   Special Requests Normal  Final   Culture (A)  Final    <10,000 COLONIES/mL INSIGNIFICANT GROWTH Performed at [facility], [address] [place], [place] [address]    Report Status 11/22/2016 FINAL  Final     Time coordinating discharge: Over 30 minutes  SIGNED:   Marinda ElkFELIZ ORTIZ, ABRAHAM, MD  [facility] 11/22/2016, 11:43 AM Pager   If 7PM-7AM, please contact night-coverage [email or web address] Password [email or web address]

## 2016-11-22 NOTE — Progress Notes (Signed)
Chief Complaint: Patient was seen in consultation today for compression fracture L1  Supervising Physician: Julieanne Cotton  Patient Status: [facility] - In-pt  History of Present Illness: Alexandra Riley is a 83 y.o. female with past medical history of constipation, osteoporosis, and glaucoma who sustained a fall at home ~2 weeks ago with acute onset of back pain.   Patient underwent CT Abd Pelvis 11/20/16 which showed: 1. L1 compression fracture deformity, new since 07/20/2015, without complicating features. 2. No acute intraabdominal process. 3. Sigmoid diverticulosis.  MRI Lumbar Spine 11/21/16 showed: 1. Acute benign-appearing mild compression fracture of the superior endplate of L1 without neural impingement. 2. Severe spinal stenosis at L2-3 with lateral recess compression, left greater than right. 3. Severe compression of the thecal sac at L3-4 due to a chronic central disc extrusion, slightly progressed since 2017. Right lateral recess compression at L3-4.  IR consulted for L1 vertebroplasty/kyphoplasty. Case reviewed by Dr. Corliss Skains who approves patient for procedure.   Her procedure was anticipated on Friday, however patient was transferred from [facility] today. Will proceed with procedure tomorrow if schedule allows.   Past Medical History:  Diagnosis Date  . Constipation   . Glaucoma   . High cholesterol   . Osteoporosis   . Shortness of breath on exertion     Past Surgical History:  Procedure Laterality Date  . CATARACT EXTRACTION W/PHACO Right 01/20/2013   Procedure: CATARACT EXTRACTION PHACO AND INTRAOCULAR LENS PLACEMENT (IOC);  Surgeon: Gemma Payor, MD;  Location: [facility];  Service: Ophthalmology;  Laterality: Right;  CDE:  18.27  . CESAREAN SECTION    . EYE SURGERY    . ORIF HIP FRACTURE Right 05/28/2014   Procedure: OPEN REDUCTION INTERNAL FIXATION HIP;  Surgeon: Darreld Mclean, MD;  Location: [facility];  Service: Orthopedics;  Laterality: Right;  . TONSILLECTOMY       Allergies: Patient has no known allergies.  Medications: Prior to Admission medications   Medication Sig Start Date End Date Taking? Authorizing Provider  aspirin EC 81 MG tablet Take 81 mg by mouth every morning.   Yes [provider]  carisoprodol (SOMA) 350 MG tablet Take 350 mg by mouth 4 (four) times daily as needed for muscle spasms.   Yes [provider]  cephALEXin (KEFLEX) 500 MG capsule Take 500 mg by mouth 3 (three) times daily.  11/10/16  Yes [provider]  dimenhyDRINATE (TRAVEL SICKNESS) 50 MG tablet Take 50 mg by mouth every 8 (eight) hours as needed.   Yes [provider]  HYDROcodone-acetaminophen (NORCO/VICODIN) 5-325 MG tablet Take 1 tablet by mouth every 6 (six) hours as needed for moderate pain (Must last 14 days.Do not take and drive a car or use machinery.). 11/15/16  Yes Darreld Mclean, MD  olopatadine (PATANOL) 0.1 % ophthalmic solution  10/11/15  Yes [provider]  ondansetron (ZOFRAN) 4 MG tablet Take 4 mg by mouth every 8 (eight) hours as needed for nausea or vomiting.  11/22/15  Yes [provider]  atorvastatin (LIPITOR) 20 MG tablet  07/27/15   [provider]  clonazePAM (KLONOPIN) 0.5 MG tablet Take 1 tablet (0.5 mg total) by mouth at bedtime. *May take one to two tablets daily as needed For anxiety 11/29/16   Marinda Elk, MD  HYDROcodone-acetaminophen (NORCO/VICODIN) 5-325 MG tablet Take 1 tablet by mouth every 6 (six) hours as needed for moderate pain (Must last 14 days.Do not take and drive a car or use machinery.). 11/22/16  Marinda ElkFeliz Ortiz, Abraham, MD  polyethylene glycol [facility](MIRALAX / Ethelene HalGLYCOLAX) packet Take 17 g by mouth daily. 11/22/16   Marinda ElkFeliz Ortiz, Abraham, MD     Family History  Problem Relation Age of Onset  . Colon cancer Neg Hx     Social History   Social History  . Marital status: Widowed    Spouse name: N/A  . Number of children: N/A  . Years of education: N/A    Social History Main Topics  . Smoking status: Never Smoker  . Smokeless tobacco: Current User    Types: Snuff  . Alcohol use No  . Drug use: No  . Sexual activity: No   Other Topics Concern  . None   Social History Narrative  . None    Review of Systems  Constitutional: Negative for fatigue and fever.  Respiratory: Negative for cough and shortness of breath.   Cardiovascular: Negative for chest pain.  Gastrointestinal: Positive for abdominal pain.  Musculoskeletal: Positive for back pain.  Psychiatric/Behavioral: Negative for behavioral problems and confusion.    Vital Signs: BP (!) 153/59 (BP Location: Right Arm)   Pulse 70   Temp 98.2 F (36.8 C) (Oral)   Resp 18   Ht 5\' 5"  (1.651 m)   Wt 114 lb 13.8 oz (52.1 kg)   SpO2 96%   BMI 19.11 kg/m   Physical Exam  Constitutional: She is oriented to person, place, and time.  Cardiovascular: Normal rate, regular rhythm and normal heart sounds.   Pulmonary/Chest: Effort normal and breath sounds normal. No respiratory distress.  Musculoskeletal:  Lower back pain with turning/moving. Patient describes pain as wrapping around sides.   Neurological: She is alert and oriented to person, place, and time.  Skin: Skin is warm and dry.  Psychiatric: She has a normal mood and affect. Her behavior is normal. Judgment and thought content normal.  Nursing note and vitals reviewed.   Mallampati Score:  MD Evaluation Airway: WNL Heart: WNL Abdomen: WNL Chest/ Lungs: WNL ASA  Classification: 3 Mallampati/Airway Score: Two  Imaging: Mr Lumbar Spine Wo Contrast  Result Date: 11/21/2016 CLINICAL DATA:  Low back pain and bilateral leg weakness. The patient fell from her bed 1 week ago. EXAM: MRI LUMBAR SPINE WITHOUT CONTRAST TECHNIQUE: Multiplanar, multisequence MR imaging of the lumbar spine was performed. No intravenous contrast was administered. COMPARISON:  CT scan dated 11/20/2016 and 07/20/2015 and radiographs dated  11/11/2014 FINDINGS: Segmentation:  Standard. Alignment: 2 mm spondylolisthesis at L2-3, chronic, unchanged since 07/20/2015. Vertebrae: There is an acute mild compression fracture of the superior endplate of L1 as demonstrated on the recent CT scan. There is no protrusion of bone into the spinal canal. Conus medullaris: Extends to the T12-L1 level and appears normal. Paraspinal and other soft tissues: Negative. Disc levels: T12-L1: Tiny central disc bulge without neural impingement, unchanged since 07/20/2015. No disc protrusion. Benign-appearing mild compression fracture of the superior endplate of L1 with no bone protrusion into the spinal canal. L1-2: Chronic broad-based disc protrusion slightly asymmetric to the left slightly compressing the left side of the thecal sac, unchanged since the prior CT scan of 07/20/2015. L2-3: Grade 1 spondylolisthesis with disc space narrowing with a small broad-based soft disc protrusion. Hypertrophy of the ligamentum flavum combine to create severe spinal stenosis. This has progressed since the prior study of 07/20/2015. Both lateral recesses are compressed, left more than right. L3-4: Chronic central soft disc extrusion extending superiorly behind the body of L3 in the midline with. Slight hypertrophy  of the ligamentum flavum. Marked narrowing of the right lateral recess which could affect the right L4 nerve. These findings appear slightly more prominent than on the prior CT scan of 07/20/2015. Severe compression of the thecal sac. L4-5: Slight bulge of the disc and lateral to the right neural foramen without neural impingement. Minimal hypertrophy of the ligamentum flavum. No spinal stenosis. L5-S1: Slight disc desiccation without disc bulging or protrusion. No neural impingement. IMPRESSION: 1. Acute benign-appearing mild compression fracture of the superior endplate of L1 without neural impingement. 2. Severe spinal stenosis at L2-3 with lateral recess compression, left  greater than right. 3. Severe compression of the thecal sac at L3-4 due to a chronic central disc extrusion, slightly progressed since 2017. Right lateral recess compression at L3-4. Electronically Signed   By: Francene BoyersJames  Maxwell M.D.   On: 11/21/2016 10:30   Ct Abdomen Pelvis W Contrast  Result Date: 11/20/2016 CLINICAL DATA:  falling at her bedside 1 week ago when she lost balance. Pt says she hurts "all over" and mostly in abdomen, back and side 10/10 pain. Pt says she feels constipated and drinks prune juice with no relief. LBM yesterday but was mostly loose EXAM: CT ABDOMEN AND PELVIS WITH CONTRAST TECHNIQUE: Multidetector CT imaging of the abdomen and pelvis was performed using the standard protocol following bolus administration of intravenous contrast. CONTRAST:  75mL ISOVUE-300 IOPAMIDOL (ISOVUE-300) INJECTION 61% COMPARISON:  07/20/2015 FINDINGS: Lower chest: Heavy mitral annulus calcifications. Atheromatous aorta. Hepatobiliary: No focal liver abnormality is seen. No gallstones, gallbladder wall thickening, or biliary dilatation. Pancreas: Unremarkable. No pancreatic ductal dilatation or surrounding inflammatory changes. Spleen: Normal in size without focal abnormality. Adrenals/Urinary Tract: Normal adrenals. Prominent extrarenal pelves left greater than right. No nephrolithiasis. No focal renal lesion. Some imaging degradation secondary to breathing motion. Stomach/Bowel: Stomach, small bowel, and colon are nondilated. The rectum is distended by fluid and gas. No wall thickening or regional inflammatory change. Scattered sigmoid diverticula. Vascular/Lymphatic: Moderate aortoiliac arterial calcifications without aneurysm or stenosis. Portal vein patent. No abdominal or pelvic adenopathy localized. Multiple pelvic phleboliths. Reproductive: Uterus and bilateral adnexa are unremarkable. Other: No ascites.  No free air. Musculoskeletal: Mild L1 compression deformity with less than 20% loss of height, no  retropulsion, new since previous study. Old Schmorl's node in T11. Fixation hardware in the right femur. No worrisome bone lesion. IMPRESSION: 1. L1 compression fracture deformity, new since 07/20/2015, without complicating features. 2. No acute intraabdominal process. 3. Sigmoid diverticulosis. 4.  Aortic Atherosclerosis (ICD10-170.0) Electronically Signed   By: Corlis Leak  Hassell M.D.   On: 11/20/2016 20:42   Dg Knee 3 Views Right  Result Date: 11/09/2016 Clinical:  Right knee pain, no trauma X-rays were done of the right knee, three views. There is mild degenerative changes of the right knee with more medial narrowing.  No fracture or loose body is present.  Bone quality is good. Impression:  Mild degenerative changes of the right knee. Electronically Signed Darreld McleanWayne Keeling, MD 7/12/20188:05 AM    Labs:  CBC:  Recent Labs  03/23/16 1504 11/20/16 1723 11/21/16 0659 11/22/16 0643  WBC 9.2 10.9* 8.0 8.3  HGB 11.7* 12.7 11.7* 12.3  HCT 37.3 40.4 37.3 39.1  PLT 184 205 195 198    COAGS:  Recent Labs  11/22/16 0643  INR 0.98    BMP:  Recent Labs  03/23/16 1504 11/20/16 1723 11/21/16 0659 11/22/16 0643  NA 145 142 145 143  K 3.6 4.1 3.8 3.6  CL 114* 109 111 109  CO2 18* 23 27 26   GLUCOSE 67 92 94 94  BUN 12 13 8 11   CALCIUM 9.4 9.7 9.1 9.0  CREATININE 0.96 1.10* 0.91 0.82  GFRNONAA 50* 42* 53* >60  GFRAA 58* 49* >60 >60    LIVER FUNCTION TESTS:  Recent Labs  11/20/16 1723  BILITOT 0.6  AST 17  ALT 13*  ALKPHOS 69  PROT 7.1  ALBUMIN 4.0    TUMOR MARKERS: No results for input(s): AFPTM, CEA, CA199, CHROMGRNA in the last 8760 hours.  Assessment and Plan: L1 Compression Fracture Patient with recent fall is found to have L1 compression fracture.  IR consulted for vertrobplasty/kyphoplasty at the request of Dr. Ophelia Charter.  Case reviewed and approved by Dr. Corliss Skains. Patient insurance does not require pre-authorization.  Her procedure was anticipated on Friday, however  she was transferred from [facility] today.  Ancitipate procedure tomorrow as schedule allows.  Patient not on blood thinners.  Urine culture from 7/23 shows no significant growth.  Patient is asymptomatic.  Daughter available by phone for consent.  Risks and benefits discussed with the patient including, but not limited to education regarding the natural healing process of compression fractures without intervention, bleeding, infection, cement migration which may cause spinal cord damage, paralysis, pulmonary embolism or even death. All of the patient's questions were answered, patient is agreeable to proceed. Consent signed and in chart.  Thank you for this interesting consult.  I greatly enjoyed meeting Alexandra Riley and look forward to participating in their care.  A copy of this report was sent to the requesting provider on this date.  Electronically Signed: Hoyt Koch, PA 11/22/2016, 4:44 PM   I spent a total of 40 Minutes    in face to face in clinical consultation, greater than 50% of which was counseling/coordinating care for L1 compression fracture.

## 2016-11-23 DIAGNOSIS — F329 Major depressive disorder, single episode, unspecified: Secondary | ICD-10-CM

## 2016-11-23 DIAGNOSIS — Z720 Tobacco use: Secondary | ICD-10-CM | POA: Diagnosis not present

## 2016-11-23 DIAGNOSIS — S32010A Wedge compression fracture of first lumbar vertebra, initial encounter for closed fracture: Secondary | ICD-10-CM | POA: Diagnosis not present

## 2016-11-23 DIAGNOSIS — E785 Hyperlipidemia, unspecified: Secondary | ICD-10-CM

## 2016-11-23 DIAGNOSIS — F419 Anxiety disorder, unspecified: Secondary | ICD-10-CM

## 2016-11-23 LAB — SURGICAL PCR SCREEN
MRSA, PCR: NEGATIVE
STAPHYLOCOCCUS AUREUS: NEGATIVE

## 2016-11-23 NOTE — Progress Notes (Signed)
Unable to do KP today secondary to scheduling and emergency cases.  We will plan for tomorrow as already scheduled at approximately 1130.  Orders in place.  Emaleigh Guimond E 11:36 AM 11/23/2016

## 2016-11-23 NOTE — Progress Notes (Signed)
[facility] PROGRESS NOTE  Alexandra OvermanRuth W Snelling AVW:[id number] DOB: 1922/02/21 DOA: 11/20/2016 PCP: Bernerd LimboAriza, Alexandra Enrique, MD  Interim summary and HPI 83 y.o. female history of osteoporosis and glaucoma, who presents with abdominal pain and back pain, CT scan of the abdomen pelvis performed on 11/20/2016 showed diffuse L1 compression fracture with lateral stenosis. MRI confirmed diagnosis and [facility]ph[facility] by IR.  Assessment/Plan: 1-mechanical fall with L1 compression fracture -patient with hx of osteoporosis at baseline -will continue PRN analgesics -IR has seen patient and looking to performed kyphoplasty later today  -will follow clinical response -PT has recommended home health services  2-anxiety/depression:  -stable and mood control -will continue clonazepam and remeron  3-HLD -will continue statins   4-constipation  -most likely associated with pain meds -continue bowel regimen and advise to maintain adequate hydration   5-tobacco abuse -cessation counseling provided -continue nicoderm    Code Status: DNR Family Communication: no family at bedside  Disposition Plan: home tomorrow with home health services; kyphoplasty planned for later today.   Consultants:  IR  Procedures:  kyphoplasty by IR planned for 7/26  Antibiotics:  none  HPI/Subjective: Afebrile, no CP, no SOB. Still with back pain.  Objective: Vitals:   11/23/16 0500 11/23/16 0953  BP: 133/69 (!) 147/61  Pulse: 71 74  Resp: 16 18  Temp: 97.6 F (36.4 C) 98.1 F (36.7 C)    Intake/Output Summary (Last 24 hours) at 11/23/16 1025 Last data filed at 11/23/16 [id number]  Gross per 24 hour  Intake            672.5 ml  Output              350 ml  Net            322.5 ml   Filed Weights   11/20/16 1630 11/21/16 0135  Weight: 51.7 kg (114 lb) 52.1 kg (114 lb 13.8 oz)    Exam:   General:  Afebrile, no CP and no SOB, patient described back pain  with movement and deep breath.  Cardiovascular: S1 and S2, no rubs, no gallops, no JVD  Respiratory: good air movement, no wheezing, no crackles   Abdomen: soft, NT, ND, positive BS  Musculoskeletal:no edema, no cyanosis  Data Reviewed: Basic Metabolic Panel:  Recent Labs Lab 11/20/16 1723 11/21/16 0659 11/22/16 0643  NA 142 145 143  K 4.1 3.8 3.6  CL 109 111 109  CO2 23 27 26   GLUCOSE 92 94 94  BUN 13 8 11   CREATININE 1.10* 0.91 0.82  CALCIUM 9.7 9.1 9.0   Liver Function Tests:  Recent Labs Lab 11/20/16 1723  AST 17  ALT 13*  ALKPHOS 69  BILITOT 0.6  PROT 7.1  ALBUMIN 4.0    Recent Labs Lab 11/20/16 1723  LIPASE 55*   CBC:  Recent Labs Lab 11/20/16 1723 11/21/16 0659 11/22/16 0643  WBC 10.9* 8.0 8.3  NEUTROABS 8.9*  --  5.3  HGB 12.7 11.7* 12.3  HCT 40.4 37.3 39.1  MCV 94.4 94.7 94.9  PLT 205 195 198   CBG: No results for input(s): GLUCAP in the last 168 hours.  Recent Results (from the past 240 hour(s))  Urine culture     Status: Abnormal   Collection Time: 11/20/16  5:06 PM  Result Value Ref Range Status   Specimen Description URINE, CLEAN CATCH  Final   Special Requests Normal  Final   Culture (A)  Final    <  10,000 COLONIES/mL INSIGNIFICANT GROWTH Performed at [facility], [address] [place], [place] [address]    Report Status 11/22/2016 FINAL  Final  Surgical pcr screen     Status: None   Collection Time: 11/22/16  9:14 PM  Result Value Ref Range Status   MRSA, PCR NEGATIVE NEGATIVE Final   Staphylococcus aureus NEGATIVE NEGATIVE Final    Comment:        The Xpert SA Assay (FDA approved for NASAL specimens in patients over 10821 years of age), is one component of a comprehensive surveillance program.  Test performance has been validated by [facility] for patients greater than or equal to 81 year old. It is not intended to diagnose infection nor to guide or monitor treatment.      Studies: No results  found.  Scheduled Meds: . aspirin EC  81 mg Oral q morning - 10a  . clonazePAM  0.5 mg Oral QHS  . docusate sodium  100 mg Oral BID  . lidocaine  1 patch Transdermal Q24H  . mirtazapine  15 mg Oral QHS  . nicotine  14 mg Transdermal Daily  . polyethylene glycol  17 g Oral Daily   Continuous Infusions: . sodium chloride 50 mL/hr at 11/23/16 0608  . [START ON 11/24/2016]  ceFAZolin (ANCEF) IV       Time spent: 30 minutes    Alexandra Riley, Alexandra Riley  [facility] Pager [phone]. If 7PM-7AM, please contact night-coverage at [email or web address], password [email or web address] 11/23/2016, 10:25 AM  LOS: 0 days

## 2016-11-24 ENCOUNTER — Inpatient Hospital Stay (HOSPITAL_COMMUNITY)
Admit: 2016-11-24 | Discharge: 2016-11-24 | Disposition: A | Discharge status: Home / Self Care | Payer: Medicare Other | Attending: Internal Medicine | Admitting: Internal Medicine

## 2016-11-24 ENCOUNTER — Inpatient Hospital Stay
Admission: RE | Admit: 2016-11-24 | Discharge: 2016-12-14 | Disposition: A | Discharge status: Home / Self Care | Payer: Medicare Other | Source: Ambulatory Visit | Attending: Internal Medicine | Admitting: Internal Medicine

## 2016-11-24 DIAGNOSIS — S32010A Wedge compression fracture of first lumbar vertebra, initial encounter for closed fracture: Secondary | ICD-10-CM

## 2016-11-24 DIAGNOSIS — M549 Dorsalgia, unspecified: Secondary | ICD-10-CM

## 2016-11-24 DIAGNOSIS — M545 Low back pain: Secondary | ICD-10-CM

## 2016-11-24 HISTORY — PX: IR KYPHO LUMBAR INC FX REDUCE BONE BX UNI/BIL CANNULATION INC/IMAGING: IMG5519

## 2016-11-24 MED ORDER — IOPAMIDOL (ISOVUE-300) INJECTION 61%
INTRAVENOUS | Status: AC
  Administered 2016-11-24: 10 mL
  Filled 2016-11-24: qty 50

## 2016-11-24 MED ORDER — TOBRAMYCIN SULFATE 1.2 G IJ SOLR
INTRAMUSCULAR | Status: AC
  Filled 2016-11-24: qty 1.2

## 2016-11-24 MED ORDER — FENTANYL CITRATE (PF) 100 MCG/2ML IJ SOLN
INTRAMUSCULAR | Status: AC | PRN
  Administered 2016-11-24 (×4): 25 ug via INTRAVENOUS

## 2016-11-24 MED ORDER — MIDAZOLAM HCL 2 MG/2ML IJ SOLN
INTRAMUSCULAR | Status: AC | PRN
  Administered 2016-11-24 (×2): 1 mg via INTRAVENOUS

## 2016-11-24 MED ORDER — TOBRAMYCIN SULFATE 1.2 G IJ SOLR
INTRAMUSCULAR | Status: AC | PRN
  Administered 2016-11-24: .1 g via TOPICAL

## 2016-11-24 MED ORDER — BUPIVACAINE HCL (PF) 0.25 % IJ SOLN
INTRAMUSCULAR | Status: AC | PRN
  Administered 2016-11-24: 20 mL

## 2016-11-24 MED ORDER — BUPIVACAINE HCL (PF) 0.25 % IJ SOLN
INTRAMUSCULAR | Status: AC
  Filled 2016-11-24: qty 30

## 2016-11-24 MED ORDER — HYDROMORPHONE HCL 1 MG/ML IJ SOLN
INTRAMUSCULAR | Status: AC
  Filled 2016-11-24: qty 0.5

## 2016-11-24 MED ORDER — NICOTINE 14 MG/24HR TD PT24: 14.0000 mg | MEDICATED_PATCH | Freq: Every day | TRANSDERMAL | 0 refills | Status: DC

## 2016-11-24 MED ORDER — FENTANYL CITRATE (PF) 100 MCG/2ML IJ SOLN
INTRAMUSCULAR | Status: AC
  Filled 2016-11-24: qty 4

## 2016-11-24 MED ORDER — HYDROCODONE-ACETAMINOPHEN 5-325 MG PO TABS: 1.0000 | ORAL_TABLET | Freq: Four times a day (QID) | ORAL | 0 refills | Status: DC | PRN

## 2016-11-24 MED ORDER — CEFAZOLIN SODIUM-DEXTROSE 2-4 GM/100ML-% IV SOLN
INTRAVENOUS | Status: AC
  Filled 2016-11-24: qty 100

## 2016-11-24 MED ORDER — CLONAZEPAM 0.5 MG PO TABS: 0.5000 mg | ORAL_TABLET | Freq: Every day | ORAL | 0 refills | Status: DC

## 2016-11-24 MED ORDER — MIDAZOLAM HCL 2 MG/2ML IJ SOLN
INTRAMUSCULAR | Status: AC
  Filled 2016-11-24: qty 4

## 2016-11-24 NOTE — Progress Notes (Addendum)
[facility] d/[facility], pt is stable. Nurse attempted to call report but unable to reach receiving nurse. First attempt to leave report, phone answered by Marchelle FolksAmanda on the [place] side. [facility] but no one answered the phone until phone was cut off. Will attempt to call back before shift change. Pt transported out of building by [employer]

## 2016-11-24 NOTE — Procedures (Signed)
S/P L1 balloon KP 

## 2016-11-24 NOTE — Clinical Social Work Note (Signed)
Clinical Social Work Assessment  Patient Details  Name: Alexandra Riley MRN: [id number] Date of Birth: May 13, 1921  Date of referral:  11/24/16               Reason for consult:  Facility Placement, Discharge Planning                Permission sought to share information with:  Facility Sport and exercise psychologist, Family Supports Permission granted to share information::  Yes, Verbal Permission Granted  Name::     Engineer, technical sales::  SNF  Relationship::  Daughter  Contact Information:     Housing/Transportation Living arrangements for the past 2 months:  [place] of Information:  Patient, Adult Children Patient Interpreter Needed:  None Criminal Activity/Legal Involvement Pertinent to Current Situation/Hospitalization:  No - Comment as needed Significant Relationships:  Adult Children, Other Family Members Lives with:  Self, Adult Children Do you feel safe going back to the place where you live?  Yes Need for family participation in patient care:  Yes (Comment) (patient not fully oriented)  Care giving concerns:  Patient has been living with daughter but requires short term rehab in order to improve mobility before returning home.   Social Worker assessment / plan:  CSW met with patient and patient's daughter, Alexandra Riley, at bedside to discuss recommendation for SNF placement. Patient's daughter, Alexandra Riley, indicated preference for [facility]. CSW contacted [facility] representative to confirm bed offer. Patient will be discharging to [facility].  Employment status:  Retired Nurse, adult PT Recommendations:  [place] / Referral to community resources:  [place]  Patient/Family's Response to care:  Patient and family agreeable to SNF placement.  Patient/Family's Understanding of and Emotional Response to Diagnosis, Current Treatment, and Prognosis:  Patient and family aware of patient's current diagnosis  and need for additional support, and are appreciative of the assistance in coordinating placement.  Emotional Assessment Appearance:  Appears stated age Attitude/Demeanor/Rapport:    Affect (typically observed):  Frustrated, Agitated Orientation:  Oriented to Self Alcohol / Substance use:  Not Applicable Psych involvement (Current and /or in the community):  No (Comment)  Discharge Needs  Concerns to be addressed:  Discharge Planning Concerns, Care Coordination Readmission within the last 30 days:  No Current discharge risk:  Physical Impairment, Cognitively Impaired Barriers to Discharge:  No Barriers Identified   Geralynn Ochs, LCSW 11/24/2016, 5:22 PM

## 2016-11-24 NOTE — Discharge Summary (Addendum)
Physician Discharge Summary  Alexandra OvermanRuth W Riley ZOX:[id number] DOB: Feb 27, 1922 DOA: 11/20/2016  PCP: Bernerd LimboAriza, Fernando Enrique, MD  Admit date: 11/20/2016 Discharge date: 11/24/2016  Admitted From:home Disposition: SNF   Recommendations for Outpatient Follow-up:  1. Follow up with PCP in 1-2 weeks 2. Please obtain BMP/CBC in one week  Home Health: SNF Equipment/Devices:none Discharge Condition:stable CODE STATUS:DNR Diet recommendation:heart healthy  Brief/Interim Summary: 83 year old female with history of osteoporosis, glaucoma presented with back pain. CT scan of abdomen and pelvis showed diffuse lumbar 1 compression fracture. MRI confirmed the diagnosis and patient was [facility]. The kyphoplasty was done today. Pain medications were adjusted. Patient needs rehabilitation. Discussed with the patient's daughter and family members and bedside. They're concerned about patient going home and requiring more care. They want to place patient to skilled facility. Social worker already involved in discharge planning. Resume patient's home medications. Patient is able to ambulate with help of walker. Recommended to follow up with PCP. At this time patient is medically stable to transfer her care to outpatient.  Resume or medications for anxiety depression, dyslipidemia, constipation. Blood pressure elevated because patient was nothing by mouth today and did not receive morning medications. Recommended to monitor blood pressure closely and pain management.   Discharge Diagnoses:  Principal Problem:   Compression fracture of L1 lumbar vertebra, closed, initial encounter [facility](HCC) Active Problems:   Abdominal pain   Back pain    Discharge Instructions  Discharge Instructions    Call MD for:  difficulty breathing, headache or visual disturbances    Complete by:  As directed    Call MD for:  extreme fatigue    Complete by:  As directed    Call MD for:   hives    Complete by:  As directed    Call MD for:  persistant dizziness or light-headedness    Complete by:  As directed    Call MD for:  persistant nausea and vomiting    Complete by:  As directed    Call MD for:  severe uncontrolled pain    Complete by:  As directed    Call MD for:  temperature >100.4    Complete by:  As directed    Diet - low sodium heart healthy    Complete by:  As directed    Diet - low sodium heart healthy    Complete by:  As directed    Face-to-face encounter (required for Medicare/Medicaid patients)    Complete by:  As directed    I David StallFELIZ ORTIZ, ABRAHAM certify that this patient is under my care and that I, or a nurse practitioner or physician's assistant working with me, had a face-to-face encounter that meets the physician face-to-face encounter requirements with this patient on 11/22/2016. The encounter with the patient was in whole, or in part for the following medical condition(s) which is the primary reason for home health care (List medical condition): lumbar compression fracture   The encounter with the patient was in whole, or in part, for the following medical condition, which is the primary reason for home health care:  lumbar compression fracture   I certify that, based on my findings, the following services are medically necessary home health services:   Nursing Physical therapy     Reason for Medically Necessary Home Health Services:  Therapy- Investment banker, operationalGait Training, Teacher, early years/preTransfer Training and Stair Training   My clinical findings support the need for the above services:  Unsafe ambulation due to balance  issues   Further, I certify that my clinical findings support that this patient is homebound due to:  Unable to leave home safely without assistance   Home Health    Complete by:  As directed    To provide the following care/treatments:   PT OT Home Health Aide     Increase activity slowly    Complete by:  As directed    Increase activity slowly    Complete  by:  As directed      Allergies as of 11/24/2016   No Known Allergies     Medication List    STOP taking these medications   cephALEXin 500 MG capsule Commonly known as:  KEFLEX     TAKE these medications   aspirin EC 81 MG tablet Take 81 mg by mouth every morning.   atorvastatin 20 MG tablet Commonly known as:  LIPITOR   carisoprodol 350 MG tablet Commonly known as:  SOMA Take 350 mg by mouth 4 (four) times daily as needed for muscle spasms.   clonazePAM 0.5 MG tablet Commonly known as:  KLONOPIN Take 1 tablet (0.5 mg total) by mouth at bedtime. *May take one to two tablets daily as needed For anxiety Start taking on:  11/29/2016 What changed:  These instructions start on 11/29/2016. If you are unsure what to do until then, ask your doctor or other care provider.   HYDROcodone-acetaminophen 5-325 MG tablet Commonly known as:  NORCO/VICODIN Take 1 tablet by mouth every 6 (six) hours as needed for moderate pain (Must last 14 days.Do not take and drive a car or use machinery.).   nicotine 14 mg/24hr patch Commonly known as:  NICODERM CQ - dosed in mg/24 hours Place 1 patch (14 mg total) onto the skin daily.   olopatadine 0.1 % ophthalmic solution Commonly known as:  PATANOL   ondansetron 4 MG tablet Commonly known as:  ZOFRAN Take 4 mg by mouth every 8 (eight) hours as needed for nausea or vomiting.   polyethylene glycol packet Commonly known as:  MIRALAX / GLYCOLAX Take 17 g by mouth daily.   TRAVEL SICKNESS 50 MG tablet Generic drug:  dimenhyDRINATE Take 50 mg by mouth every 8 (eight) hours as needed.       Contact information for follow-up providers    Eden EmmsAriza, Hali MarryFernando Enrique, MD. Schedule an appointment as soon as possible for a visit in 1 week(s).   Specialty:  Internal Medicine Contact information: [address] [place] [address] [phone]            Contact information for after-discharge care    Destination    [facility]  SNF .   Specialty:  Skilled Nursing Facility Contact information: [address] [place] [id number] [phone]                 No Known Allergies  Consultations: Interventional radiology  Procedures/Studies: Kyphoplasty  Subjective: Seen and examined at bedside. Denied headache, dizziness, nausea vomiting chest pain shortness of breath. Back pain is better with the medications. Able to ambulate with help of walker. Discharge Exam: Vitals:   11/24/16 0610 11/24/16 1356  BP: (!) 150/63 (!) 165/65  Pulse: 70 71  Resp: 16 18  Temp: 98.3 F (36.8 C) 98.5 F (36.9 C)   Vitals:   11/23/16 2230 11/24/16 0215 11/24/16 0610 11/24/16 1356  BP: 115/72 118/62 (!) 150/63 (!) 165/65  Pulse: 82 70 70 71  Resp: 18 16 16  18  Temp: 98.9 F (37.2 C) 98.6 F (37 C) 98.3 F (36.8 C) 98.5 F (36.9 C)  TempSrc: Oral Oral Oral Oral  SpO2: 99% 98% 97% 98%  Weight:      Height:        General: Pt is alert, awake, not in acute distress Cardiovascular: RRR, S1/S2 +, no rubs, no gallops Respiratory: CTA bilaterally, no wheezing, no rhonchi Abdominal: Soft, NT, ND, bowel sounds + Extremities: no edema, no cyanosis Neurology: Alert, awake, oriented. No focal neurological deficit.   The results of significant diagnostics from this hospitalization (including imaging, microbiology, ancillary and laboratory) are listed below for reference.     Microbiology: Recent Results (from the past 240 hour(s))  Urine culture     Status: Abnormal   Collection Time: 11/20/16  5:06 PM  Result Value Ref Range Status   Specimen Description URINE, CLEAN CATCH  Final   Special Requests Normal  Final   Culture (A)  Final    <10,000 COLONIES/mL INSIGNIFICANT GROWTH Performed at [facility], [address], [place], [place] [address]    Report Status 11/22/2016 FINAL  Final  Surgical pcr screen     Status: None   Collection Time: 11/22/16  9:14 PM  Result Value Ref  Range Status   MRSA, PCR NEGATIVE NEGATIVE Final   Staphylococcus aureus NEGATIVE NEGATIVE Final    Comment:        The Xpert SA Assay (FDA approved for NASAL specimens in patients over 64 years of age), is one component of a comprehensive surveillance program.  Test performance has been validated by [facility] for patients greater than or equal to 73 year old. It is not intended to diagnose infection nor to guide or monitor treatment.      Labs: BNP (last 3 results) No results for input(s): BNP in the last 8760 hours. Basic Metabolic Panel:  Recent Labs Lab 11/20/16 1723 11/21/16 0659 11/22/16 0643  NA 142 145 143  K 4.1 3.8 3.6  CL 109 111 109  CO2 23 27 26   GLUCOSE 92 94 94  BUN 13 8 11   CREATININE 1.10* 0.91 0.82  CALCIUM 9.7 9.1 9.0   Liver Function Tests:  Recent Labs Lab 11/20/16 1723  AST 17  ALT 13*  ALKPHOS 69  BILITOT 0.6  PROT 7.1  ALBUMIN 4.0    Recent Labs Lab 11/20/16 1723  LIPASE 55*   No results for input(s): AMMONIA in the last 168 hours. CBC:  Recent Labs Lab 11/20/16 1723 11/21/16 0659 11/22/16 0643  WBC 10.9* 8.0 8.3  NEUTROABS 8.9*  --  5.3  HGB 12.7 11.7* 12.3  HCT 40.4 37.3 39.1  MCV 94.4 94.7 94.9  PLT 205 195 198   Cardiac Enzymes: No results for input(s): CKTOTAL, CKMB, CKMBINDEX, TROPONINI in the last 168 hours. BNP: Invalid input(s): POCBNP CBG: No results for input(s): GLUCAP in the last 168 hours. D-Dimer No results for input(s): DDIMER in the last 72 hours. Hgb A1c No results for input(s): HGBA1C in the last 72 hours. Lipid Profile No results for input(s): CHOL, HDL, LDLCALC, TRIG, CHOLHDL, LDLDIRECT in the last 72 hours. Thyroid function studies No results for input(s): TSH, T4TOTAL, T3FREE, THYROIDAB in the last 72 hours.  Invalid input(s): FREET3 Anemia work up No results for input(s): VITAMINB12, FOLATE, FERRITIN, TIBC, IRON, RETICCTPCT in the last 72 hours. Urinalysis    Component Value  Date/Time   COLORURINE AMBER (A) 11/20/2016 1705   APPEARANCEUR CLEAR 11/20/2016 1705  LABSPEC 1.009 11/20/2016 1705   PHURINE 5.0 11/20/2016 1705   GLUCOSEU NEGATIVE 11/20/2016 1705   HGBUR SMALL (A) 11/20/2016 1705   BILIRUBINUR NEGATIVE 11/20/2016 1705   KETONESUR NEGATIVE 11/20/2016 1705   PROTEINUR NEGATIVE 11/20/2016 1705   UROBILINOGEN 0.2 11/11/2014 0910   NITRITE NEGATIVE 11/20/2016 1705   LEUKOCYTESUR TRACE (A) 11/20/2016 1705   Sepsis Labs Invalid input(s): PROCALCITONIN,  WBC,  LACTICIDVEN Microbiology Recent Results (from the past 240 hour(s))  Urine culture     Status: Abnormal   Collection Time: 11/20/16  5:06 PM  Result Value Ref Range Status   Specimen Description URINE, CLEAN CATCH  Final   Special Requests Normal  Final   Culture (A)  Final    <10,000 COLONIES/mL INSIGNIFICANT GROWTH Performed at [facility], [address], [place], [place] [address]    Report Status 11/22/2016 FINAL  Final  Surgical pcr screen     Status: None   Collection Time: 11/22/16  9:14 PM  Result Value Ref Range Status   MRSA, PCR NEGATIVE NEGATIVE Final   Staphylococcus aureus NEGATIVE NEGATIVE Final    Comment:        The Xpert SA Assay (FDA approved for NASAL specimens in patients over 68 years of age), is one component of a comprehensive surveillance program.  Test performance has been validated by [facility] for patients greater than or equal to 75 year old. It is not intended to diagnose infection nor to guide or monitor treatment.      Time coordinating discharge: 32 minutes  SIGNED:   Maxie Barb, MD  [facility] 11/24/2016, 3:25 PM  If 7PM-7AM, please contact night-coverage [email or web address] Password [email or web address]

## 2016-11-24 NOTE — [place] FL2 (Signed)
[place] MEDICAID FL2 LEVEL OF CARE SCREENING TOOL     IDENTIFICATION  Patient Name: Alexandra Riley Birthdate: 07-04-1921 Sex: female Admission Date (Current Location): 11/20/2016  [facility] [place]  [place]   Facility and Address:  [facility] [place], [address], [place], [place] [address]      Provider Number: [id number]  Attending Physician Name and Address:  Maxie BarbBhandari, Dron Prasad, MD  Relative Name and Phone Number:       Current Level of Care: Hospital Recommended Level of Care: Skilled Nursing Facility Prior Approval Number:    Date Approved/Denied:   PASRR Number: [id number]  Discharge Plan: SNF    Current Diagnoses: Patient Active Problem List   Diagnosis Date Noted  . Compression fracture of L1 lumbar vertebra, closed, initial encounter (HCC) 11/20/2016  . Chronic left shoulder pain 10/03/2016  . Abdominal pain 07/05/2015  . Hematometra 06/24/2015  . Postmenopausal vaginal bleeding 06/24/2015  . Hypokalemia 11/11/2014  . Recurrent falls 11/11/2014  . Rhabdomyolysis 11/11/2014  . Hip fracture (HCC) 05/26/2014    Orientation RESPIRATION BLADDER Height & Weight     Self  Normal Continent, Incontinent (sometimes incontinent) Weight: 114 lb 13.8 oz (52.1 kg) Height:  5\' 5"  (165.1 cm)  BEHAVIORAL SYMPTOMS/MOOD NEUROLOGICAL BOWEL NUTRITION STATUS      Continent    AMBULATORY STATUS COMMUNICATION OF NEEDS Skin   Extensive Assist Verbally Normal                       Personal Care Assistance Level of Assistance  Bathing, Dressing Bathing Assistance: Limited assistance   Dressing Assistance: Limited assistance     Functional Limitations Info  Hearing   Hearing Info: Impaired (moderate HOH)      SPECIAL CARE FACTORS FREQUENCY  PT (By licensed PT), OT (By licensed OT)     PT Frequency: 5x/wk OT Frequency: 5x/wk            Contractures      Additional Factors Info  Code Status, Allergies,  Psychotropic Code Status Info: DNR Allergies Info: NKA Psychotropic Info: Remeron 15mg          Current Medications (11/24/2016):  This is the current hospital active medication list Current Facility-Administered Medications  Medication Dose Route Frequency Provider Last Rate Last Dose  . 0.9 %  sodium chloride infusion   Intravenous Continuous Jonah BlueYates, Jennifer, MD 50 mL/hr at 11/24/16 0239    . acetaminophen (TYLENOL) tablet 650 mg  650 mg Oral Q6H PRN Jonah BlueYates, Jennifer, MD       Or  . acetaminophen (TYLENOL) suppository 650 mg  650 mg Rectal Q6H PRN Jonah BlueYates, Jennifer, MD      . aspirin EC tablet 81 mg  81 mg Oral q morning - 10a Jonah BlueYates, Jennifer, MD   81 mg at 11/22/16 0944  . bisacodyl (DULCOLAX) EC tablet 5 mg  5 mg Oral Daily PRN Jonah BlueYates, Jennifer, MD      . ceFAZolin (ANCEF) IVPB 2g/100 mL premix  2 g Intravenous On Call Barnetta Chapelsborne, Kelly, PA-C      . clonazePAM Scarlette Calico(KLONOPIN) tablet 0.5 mg  0.5 mg Oral Noemi ChapelQHS Yates, Jennifer, MD   0.5 mg at 11/23/16 2143  . docusate sodium (COLACE) capsule 100 mg  100 mg Oral BID Jonah BlueYates, Jennifer, MD   100 mg at 11/23/16 2143  . lidocaine (LIDODERM) 5 % 1 patch  1 patch Transdermal Q24H Jonah BlueYates, Jennifer, MD   Stopped at 11/24/16 0200  .  milk and molasses enema  1 enema Rectal Once PRN Jonah BlueYates, Jennifer, MD      . mirtazapine (REMERON) tablet 15 mg  15 mg Oral Noemi ChapelQHS Yates, Jennifer, MD   15 mg at 11/23/16 2143  . morphine 2 MG/ML injection 2 mg  2 mg Intravenous Q2H PRN Jonah BlueYates, Jennifer, MD   2 mg at 11/21/16 1320  . nicotine (NICODERM CQ - dosed in mg/24 hours) patch 14 mg  14 mg Transdermal Daily Jonah BlueYates, Jennifer, MD   14 mg at 11/24/16 1029  . ondansetron (ZOFRAN) tablet 4 mg  4 mg Oral Q6H PRN Jonah BlueYates, Jennifer, MD   4 mg at 11/22/16 1349   Or  . ondansetron (ZOFRAN) injection 4 mg  4 mg Intravenous Q6H PRN Jonah BlueYates, Jennifer, MD      . polyethylene glycol (MIRALAX / GLYCOLAX) packet 17 g  17 g Oral Daily Jonah BlueYates, Jennifer, MD   17 g at 11/22/16 0944  . traMADol (ULTRAM) tablet  50 mg  50 mg Oral Q6H PRN Jonah BlueYates, Jennifer, MD   50 mg at 11/22/16 1349     Discharge Medications: Please see discharge summary for a list of discharge medications.  Relevant Imaging Results:  Relevant Lab Results:   Additional Information SS#: [id number]  Alexandra LenisElizabeth M Alleta Avery, LCSW

## 2016-11-24 NOTE — Care Management Note (Signed)
Case Management Note  Patient Details  Name: Syliva OvermanRuth W Beavers MRN: [id number] Date of Birth: 11-30-1921  Subjective/Objective:                    Action/Plan: Pt discharging to SNF today. No further needs per CM.   Expected Discharge Date:  11/24/16               Expected Discharge Plan:  Home w Home Health Services  In-House Referral:  NA  Discharge planning Services  CM Consult  Post Acute Care Choice:  Home Health Choice offered to:  Patient, Adult Children  DME Arranged:    DME Agency:     HH Arranged:  PT, RN HH Agency:  [facility]  Status of Service:  Completed, signed off  If discussed at Long Length of Stay Meetings, dates discussed:    Additional Comments:  Kermit BaloKelli F Vail Vuncannon, RN 11/24/2016, 5:04 PM

## 2016-11-24 NOTE — Progress Notes (Signed)
Discharge to: [facility] Anticipated discharge date: 11/24/16 Family notified: Yes, by phone Transportation by: PTAR  Report #: [id number]  CSW signing off.  Blenda Nicelylizabeth Danylah Holden LCSW [phone]

## 2016-11-24 NOTE — Progress Notes (Signed)
Physical Therapy Treatment Patient Details Name: Alexandra Riley MRN: [id number] DOB: [birth year] Today's Date: 11/24/2016    History of Present Illness Alexandra Riley is a 83yo white [facility]/t continued pain in low back after sustaining a fall onto buttocks 1.5WA. PMH: OP, constipation, glaucoma, Rt Hip Fx s/p ORIF (2016). Pt is found to have L1 compression fracture, MRI reporting as 'benign looking' as well as other progressed degerative findings in the lumbar spine questionable for neurological involvement.  Pt is now [facility]. History is complicated by moderate HOH and circumlocution, but pt reports living alone with private caregiver services 12-4 M-F and additional family support on weekends. Pt performs mostly household distance AMB with RW and life alert. The patient has an extensive falls history including hip ORIF and STR. This appears to be her baseline after review of last PT notes from 2 admissions in 2016, at which point family then voided concerns about safety of patient and being able to provide adeuqate care.     PT Comments    Pt limited this session secondary to pain and lethargy. Pt only tolerating transfers and small side steps and EOB. Plan for pt to go to OR today for vertebroplasty/kyphoplasty secondary to L1 compression fx. Pt would greatly benefit from ST rehab at a SNF prior to returning home. Pt would continue to benefit from skilled physical therapy services at this time while admitted and after d/c to address the below listed limitations in order to improve overall safety and independence with functional mobility.   Follow Up Recommendations  SNF     Equipment Recommendations  None recommended by PT    Recommendations for Other Services       Precautions / Restrictions Precautions Precautions: Fall;Back Restrictions Weight Bearing Restrictions: No    Mobility  Bed Mobility Overal bed  mobility: Modified Independent             General bed mobility comments: increased time and effort  Transfers Overall transfer level: Needs assistance Equipment used: Rolling walker (2 wheeled) Transfers: Sit to/from BJ'sStand;Stand Pivot Transfers Sit to Stand: Min assist;Min guard Stand pivot transfers: Mod assist       General transfer comment: increased time, initially min A to rise from bed but after multiple bouts pt progressing to only min guard. Mod A for pivotal movements bed<>BSC  Ambulation/Gait             General Gait Details: pt taking 3 small side steps at EOB towards her L with min A; limited secondary to lethargy, fatigue and pain   Stairs            Wheelchair Mobility    Modified Rankin (Stroke Patients Only)       Balance Overall balance assessment: Needs assistance Sitting-balance support: Feet supported Sitting balance-Leahy Scale: Fair     Standing balance support: During functional activity;Bilateral upper extremity supported Standing balance-Leahy Scale: Poor Standing balance comment: pt reliant on bilateral UEs on RW                            Cognition Arousal/Alertness: Lethargic Behavior During Therapy: WFL for tasks assessed/performed Overall Cognitive Status: No family/caregiver present to determine baseline cognitive functioning Area of Impairment: Safety/judgement;Problem solving                         Safety/Judgement:  Decreased awareness of deficits;Decreased awareness of safety   Problem Solving: Slow processing;Difficulty sequencing;Requires verbal cues        Exercises      General Comments        Pertinent Vitals/Pain Pain Assessment: Faces Faces Pain Scale: Hurts even more Pain Location: low back pain  Pain Descriptors / Indicators: Aching Pain Intervention(s): Monitored during session;Repositioned    Home Living                      Prior Function            PT  Goals (current goals can now be found in the care plan section) Progress towards PT goals: Not progressing toward goals - comment (pain)    Frequency    Min 3X/week      PT Plan Discharge plan needs to be updated    Co-evaluation              AM-PAC PT "6 Clicks" Daily Activity  Outcome Measure  Difficulty turning over in bed (including adjusting bedclothes, sheets and blankets)?: A Little Difficulty moving from lying on back to sitting on the side of the bed? : A Little Difficulty sitting down on and standing up from a chair with arms (e.g., wheelchair, bedside commode, etc,.)?: Total Help needed moving to and from a bed to chair (including a wheelchair)?: A Lot Help needed walking in hospital room?: A Lot Help needed climbing 3-5 steps with a railing? : Total 6 Click Score: 12    End of Session Equipment Utilized During Treatment: Gait belt Activity Tolerance: Patient limited by lethargy;Patient limited by pain Patient left: in bed;with call bell/phone within reach;with bed alarm set Nurse Communication: Mobility status PT Visit Diagnosis: Other abnormalities of gait and mobility (R26.89);Pain Pain - part of body:  (back)     Time: [id number] PT Time Calculation (min) (ACUTE ONLY): 25 min  Charges:  $Therapeutic Activity: 23-37 mins                    G Codes:       Spring GardensJennifer Ziara Thelander, PT, DPT [phone]    [name] 11/24/2016, 9:21 AM

## 2016-11-27 ENCOUNTER — Encounter (HOSPITAL_COMMUNITY)
Admission: RE | Admit: 2016-11-27 | Discharge: 2016-11-27 | Disposition: A | Discharge status: Home / Self Care | Payer: Medicare Other | Source: Skilled Nursing Facility | Attending: *Deleted | Admitting: *Deleted

## 2016-11-27 ENCOUNTER — Encounter (HOSPITAL_COMMUNITY): Payer: Self-pay | Admitting: Interventional Radiology

## 2016-11-27 ENCOUNTER — Non-Acute Institutional Stay (SKILLED_NURSING_FACILITY): Payer: Medicare Other | Admitting: Internal Medicine

## 2016-11-27 DIAGNOSIS — R109 Unspecified abdominal pain: Secondary | ICD-10-CM

## 2016-11-27 DIAGNOSIS — K5900 Constipation, unspecified: Secondary | ICD-10-CM | POA: Diagnosis not present

## 2016-11-27 DIAGNOSIS — F419 Anxiety disorder, unspecified: Secondary | ICD-10-CM | POA: Diagnosis not present

## 2016-11-27 DIAGNOSIS — S32010A Wedge compression fracture of first lumbar vertebra, initial encounter for closed fracture: Secondary | ICD-10-CM | POA: Diagnosis not present

## 2016-11-27 DIAGNOSIS — R69 Illness, unspecified: Secondary | ICD-10-CM | POA: Insufficient documentation

## 2016-11-27 LAB — CBC
HEMATOCRIT: 36.9 % (ref 36.0–46.0)
Hemoglobin: 11.8 g/dL — ABNORMAL LOW (ref 12.0–15.0)
MCH: 30.1 pg (ref 26.0–34.0)
MCHC: 32 g/dL (ref 30.0–36.0)
MCV: 94.1 fL (ref 78.0–100.0)
Platelets: 198 10*3/uL (ref 150–400)
RBC: 3.92 MIL/uL (ref 3.87–5.11)
RDW: 13.8 % (ref 11.5–15.5)
WBC: 7.2 10*3/uL (ref 4.0–10.5)

## 2016-11-27 LAB — BASIC METABOLIC PANEL
Anion gap: 9 (ref 5–15)
BUN: 20 mg/dL (ref 6–20)
CALCIUM: 9.1 mg/dL (ref 8.9–10.3)
CO2: 25 mmol/L (ref 22–32)
CREATININE: 0.7 mg/dL (ref 0.44–1.00)
Chloride: 106 mmol/L (ref 101–111)
GFR calc non Af Amer: >60 mL/min (ref >60)
GLUCOSE: 95 mg/dL (ref 65–99)
Potassium: 3.7 mmol/L (ref 3.5–5.1)
Sodium: 140 mmol/L (ref 135–145)

## 2016-11-27 NOTE — Progress Notes (Signed)
Location:   [facility] Room Number: 152/D Place of Service:  SNF [id number]) Provider:  Cleotis LemaArlo Laguana Desautel  Ariza, Hali MarryFernando Enrique, MD  Patient Care Team: Bernerd LimboAriza, Fernando Enrique, MD as PCP - General (Internal Medicine) Jena Gaussourk, Gerrit Friendsobert M, MD as Consulting Physician (Gastroenterology)  Extended Emergency Contact Information Primary Emergency Contact: Woods,Vilma Address: [address]          [place], [place] [address] [place] Home Phone: [phone] Mobile Phone: [phone] Relation: Daughter Secondary Emergency Contact: Tillman SersWoods,Kelly  [place] Home Phone: [phone] Relation: Grandson  Code Status:  DNR Goals of care: Advanced Directive information Advanced Directives 11/27/2016  Does Patient Have a Medical Advance Directive? Yes  Type of Advance Directive Out of facility DNR (pink MOST or yellow form)  Does patient want to make changes to medical advance directive? No - Patient declined  Would patient like information on creating a medical advance directive? No - Patient declined  Pre-existing out of facility DNR order (yellow form or pink MOST form) -     Chief Complaint  Patient presents with  . Acute Visit    Acute visit   Status post hospitalization for L1 vertebral fracture status post kyphoplasty  HPI:  Pt is a 83 y.o. female seen today for an acute visit for presented to the hospital with increased back pain CT scan of the abdomen up L and showed diffuse lumbar 1 compression fracture M IR confirm this.  She did undergo a kyphoplasty which apparently was done and postop course was unremarkable pain medications were adjusted.  She is currently on hydrocodone 5-3 25 mg every 6 hours when necessary-she also is on Soma 4 times a day as needed.  She apparently has been on both these medications long-term-she says over the weekend apparently she did not receive any Soma she was concerned about this-this was discussed with  nursing and reiterated to patient as well that she needs to ask for this and nursing can provided.  She is also on Klonopin at night in when necessary once during the day-she says she has been on all these medications and tolerated them well in the past.  She is quite aware what medicines she takes at home.  She also says she has upset stomach frequently and has a when necessary Zofran.  She is also on Lipitor but says she has not taken this in some time and does not want to take this so we will discontinue it.  Her vital signs are stable she appears to be doing well today but is concerned about not receiving her Tresa GarterSoma and feels that may be contributing to her pain  Her other medical conditions include history of glaucoma-osteoporosis-anxiety which she is on Klonopin for as well as constipation she is on MiraLAX.  Again she does have a history of hyperlipidemia but does not want to take the Lipitor and has not taken it in some time  Past Medical History:  Diagnosis Date  . Constipation   . Glaucoma   . High cholesterol   . Osteoporosis   . Shortness of breath on exertion    Past Surgical History:  Procedure Laterality Date  . CATARACT EXTRACTION W/PHACO Right 01/20/2013   Procedure: CATARACT EXTRACTION PHACO AND INTRAOCULAR LENS PLACEMENT (IOC);  Surgeon: Gemma PayorKerry Hunt, MD;  Location: [facility];  Service: Ophthalmology;  Laterality: Right;  CDE:  18.27  . CESAREAN SECTION    . EYE SURGERY    . IR KYPHO  LUMBAR INC FX REDUCE BONE BX UNI/BIL CANNULATION INC/IMAGING  11/24/2016  . ORIF HIP FRACTURE Right 05/28/2014   Procedure: OPEN REDUCTION INTERNAL FIXATION HIP;  Surgeon: Darreld McleanWayne Keeling, MD;  Location: [facility];  Service: Orthopedics;  Laterality: Right;  . TONSILLECTOMY      No Known Allergies  Outpatient Encounter Prescriptions as of 11/27/2016  Medication Sig  . aspirin EC 81 MG tablet Take 81 mg by mouth every morning.  Marland Kitchen. atorvastatin (LIPITOR) 20 MG tablet   . carisoprodol (SOMA) 350  MG tablet Take 350 mg by mouth 4 (four) times daily as needed for muscle spasms.  Melene Muller. [START ON 11/29/2016] clonazePAM (KLONOPIN) 0.5 MG tablet Take 1 tablet (0.5 mg total) by mouth at bedtime. *May take one to two tablets daily as needed For anxiety  . dimenhyDRINATE (TRAVEL SICKNESS) 50 MG tablet Take 50 mg by mouth every 8 (eight) hours as needed.  Marland Kitchen. HYDROcodone-acetaminophen (NORCO/VICODIN) 5-325 MG tablet Take 1 tablet by mouth every 6 (six) hours as needed for moderate pain (Must last 14 days.Do not take and drive a car or use machinery.).  Marland Kitchen. olopatadine (PATANOL) 0.1 % ophthalmic solution Place 1 drop into both eyes 2 (two) times daily.   . ondansetron (ZOFRAN) 4 MG tablet Take 4 mg by mouth every 8 (eight) hours as needed for nausea or vomiting.   . polyethylene glycol (MIRALAX / GLYCOLAX) packet Take 17 g by mouth daily.  . [DISCONTINUED] nicotine (NICODERM CQ - DOSED IN MG/24 HOURS) 14 mg/24hr patch Place 1 patch (14 mg total) onto the skin daily.   No facility-administered encounter medications on file as of 11/27/2016.     Review of Systems   In general she not complaining of any fever or chills.  Skin does not complain of rashes or itching kyphoplasty site mid low back appears benign.  Head ears eyes nose mouth and throat does not complaining of any visual changes or sore throat.  Does not complain of difficulty swallowing.  Respiratory is not complaining of shortness breath or cough.  Cardiac does not complain of chest pain and does not have significant lower extremity edema.  GI does not complain of abdominal discomfort diarrhea or constipation at this time says at times she does feel like she has an upset stomach and apparently this Zofran helps with this.  GU does not complain of dysuria.  Muscle skeletal he is complaining of some diffuse pain says she has been on Vicodin and Soma for an extended period time and tolerated these well and this appears to help.  Neurologic  is not complaining of dizziness headache or syncope.   psych does not complain of any overt anxiety or depressive symptoms   Immunization History  Administered Date(s) Administered  . Tdap 10/10/2016   Pertinent  Health Maintenance Due  Topic Date Due  . PNA vac Low Risk Adult (1 of 2 - PCV13) 04/25/1989  . INFLUENZA VACCINE  11/29/2016  . DEXA SCAN  Completed   No flowsheet data found. Functional Status Survey:    Vitals:   11/27/16 1536  BP: (!) 112/58  Pulse: (!) 59  Resp: 20  Temp: (!) 97 F (36.1 C)  TempSrc: Oral    Physical Exam   Gen. this is a somewhat frail elderly female in no distress she is bright alert.  Her skin is warm and dry kyphoplasty site lower back midline appears benign without any sign of infection there is just a very minimal incision site.  Eyes  pupils appear reactive light sclera and conjunctivae clear she has prescription lenses visual acuity appears grossly intact.  Oropharynx is clear mucous membranes moist.  Chest is clear to auscultation with shallow air entry there is no labored breathing there is kyphosis which I suspect is contributing to the shallow breath sounds.  Heart is regular rate and rhythm without murmur gallop or rub she does not have significant lower extremity edema she has somewhat reduced pedal pulses bilaterally.  Abdomen is soft nontender with positive bowel sounds.  Musculoskeletal is able to move all extremities 4 to appears with appropriate strength in all 4 extremities. Strength is strong bilaterally.  She does have arthritic changes of her legs and arms hands which appear to be age-appropriate.  Neurologic is grossly intact her speech is clear no lateralizing findings.  Psych she is alert and oriented pleasant and appropriate   Labs reviewed:  Recent Labs  11/21/16 0659 11/22/16 0643 11/27/16 0430  NA 145 143 140  K 3.8 3.6 3.7  CL 111 109 106  CO2 27 26 25   GLUCOSE 94 94 95  BUN 8 11 20     CREATININE 0.91 0.82 0.70  CALCIUM 9.1 9.0 9.1    Recent Labs  11/20/16 1723  AST 17  ALT 13*  ALKPHOS 69  BILITOT 0.6  PROT 7.1  ALBUMIN 4.0    Recent Labs  11/20/16 1723 11/21/16 0659 11/22/16 0643 11/27/16 0430  WBC 10.9* 8.0 8.3 7.2  NEUTROABS 8.9*  --  5.3  --   HGB 12.7 11.7* 12.3 11.8*  HCT 40.4 37.3 39.1 36.9  MCV 94.4 94.7 94.9 94.1  PLT 205 195 198 198   Lab Results  Component Value Date   TSH 1.337 03/23/2016   No results found for: HGBA1C No results found for: CHOL, HDL, LDLCALC, LDLDIRECT, TRIG, CHOLHDL  Significant Diagnostic Results in last 30 days:  Mr Lumbar Spine Wo Contrast  Result Date: 11/21/2016 CLINICAL DATA:  Low back pain and bilateral leg weakness. The patient fell from her bed 1 week ago. EXAM: MRI LUMBAR SPINE WITHOUT CONTRAST TECHNIQUE: Multiplanar, multisequence MR imaging of the lumbar spine was performed. No intravenous contrast was administered. COMPARISON:  CT scan dated 11/20/2016 and 07/20/2015 and radiographs dated 11/11/2014 FINDINGS: Segmentation:  Standard. Alignment: 2 mm spondylolisthesis at L2-3, chronic, unchanged since 07/20/2015. Vertebrae: There is an acute mild compression fracture of the superior endplate of L1 as demonstrated on the recent CT scan. There is no protrusion of bone into the spinal canal. Conus medullaris: Extends to the T12-L1 level and appears normal. Paraspinal and other soft tissues: Negative. Disc levels: T12-L1: Tiny central disc bulge without neural impingement, unchanged since 07/20/2015. No disc protrusion. Benign-appearing mild compression fracture of the superior endplate of L1 with no bone protrusion into the spinal canal. L1-2: Chronic broad-based disc protrusion slightly asymmetric to the left slightly compressing the left side of the thecal sac, unchanged since the prior CT scan of 07/20/2015. L2-3: Grade 1 spondylolisthesis with disc space narrowing with a small broad-based soft disc protrusion.  Hypertrophy of the ligamentum flavum combine to create severe spinal stenosis. This has progressed since the prior study of 07/20/2015. Both lateral recesses are compressed, left more than right. L3-4: Chronic central soft disc extrusion extending superiorly behind the body of L3 in the midline with. Slight hypertrophy of the ligamentum flavum. Marked narrowing of the right lateral recess which could affect the right L4 nerve. These findings appear slightly more prominent than on the prior  CT scan of 07/20/2015. Severe compression of the thecal sac. L4-5: Slight bulge of the disc and lateral to the right neural foramen without neural impingement. Minimal hypertrophy of the ligamentum flavum. No spinal stenosis. L5-S1: Slight disc desiccation without disc bulging or protrusion. No neural impingement. IMPRESSION: 1. Acute benign-appearing mild compression fracture of the superior endplate of L1 without neural impingement. 2. Severe spinal stenosis at L2-3 with lateral recess compression, left greater than right. 3. Severe compression of the thecal sac at L3-4 due to a chronic central disc extrusion, slightly progressed since 2017. Right lateral recess compression at L3-4. Electronically Signed   By: Francene Boyers M.D.   On: 11/21/2016 10:30   Ct Abdomen Pelvis W Contrast  Result Date: 11/20/2016 CLINICAL DATA:  falling at her bedside 1 week ago when she lost balance. Pt says she hurts "all over" and mostly in abdomen, back and side 10/10 pain. Pt says she feels constipated and drinks prune juice with no relief. LBM yesterday but was mostly loose EXAM: CT ABDOMEN AND PELVIS WITH CONTRAST TECHNIQUE: Multidetector CT imaging of the abdomen and pelvis was performed using the standard protocol following bolus administration of intravenous contrast. CONTRAST:  75mL ISOVUE-300 IOPAMIDOL (ISOVUE-300) INJECTION 61% COMPARISON:  07/20/2015 FINDINGS: Lower chest: Heavy mitral annulus calcifications. Atheromatous aorta.  Hepatobiliary: No focal liver abnormality is seen. No gallstones, gallbladder wall thickening, or biliary dilatation. Pancreas: Unremarkable. No pancreatic ductal dilatation or surrounding inflammatory changes. Spleen: Normal in size without focal abnormality. Adrenals/Urinary Tract: Normal adrenals. Prominent extrarenal pelves left greater than right. No nephrolithiasis. No focal renal lesion. Some imaging degradation secondary to breathing motion. Stomach/Bowel: Stomach, small bowel, and colon are nondilated. The rectum is distended by fluid and gas. No wall thickening or regional inflammatory change. Scattered sigmoid diverticula. Vascular/Lymphatic: Moderate aortoiliac arterial calcifications without aneurysm or stenosis. Portal vein patent. No abdominal or pelvic adenopathy localized. Multiple pelvic phleboliths. Reproductive: Uterus and bilateral adnexa are unremarkable. Other: No ascites.  No free air. Musculoskeletal: Mild L1 compression deformity with less than 20% loss of height, no retropulsion, new since previous study. Old Schmorl's node in T11. Fixation hardware in the right femur. No worrisome bone lesion. IMPRESSION: 1. L1 compression fracture deformity, new since 07/20/2015, without complicating features. 2. No acute intraabdominal process. 3. Sigmoid diverticulosis. 4.  Aortic Atherosclerosis (ICD10-170.0) Electronically Signed   By: Corlis Leak M.D.   On: 11/20/2016 20:42   Ir Kypho Lumbar Inc Fx Reduce Bone Bx Uni/bil Cannulation Inc/imaging  Result Date: 11/27/2016 INDICATION: Severe low back pain secondary to compression fracture at L1. EXAM: BALLOON KYPHOPLASTY AT L1 COMPARISON:  MRI of the lumbosacral spine of 11/21/2016. MEDICATIONS: As antibiotic prophylaxis, Ancef 2 g IV was ordered pre-procedure and administered intravenously within 1 hour of incision. ANESTHESIA/SEDATION: Moderate (conscious) sedation was employed during this procedure. A total of Versed 2 mg and Fentanyl 100 mcg was  administered intravenously. Moderate Sedation Time: 25 minutes. The patient's level of consciousness and vital signs were monitored continuously by radiology nursing throughout the procedure under my direct supervision. FLUOROSCOPY TIME:  Fluoroscopy Time: 9 minutes 18 seconds (686 mGy) COMPLICATIONS: None immediate. PROCEDURE: Following a full explanation of the procedure along with the potential associated complications, an informed witnessed consent was obtained. The patient was placed prone on the fluoroscopic table. The skin overlying the lumbar region was then prepped and draped in the usual sterile fashion. The right pedicle at L1 was then infiltrated with 0.25% bupivacaine followed by the advancement of an 11-gauge  Jamshidi needle through the right pedicle into the posterior one-third at L1. This was then exchanged for a Kyphon advanced osteo introducer system comprised of a working cannula and a Kyphon osteo drill. This combination was then advanced over a Kyphon osteo bone pin until the tip of the Kyphon osteo drill was in the posterior third at L1. At this time, the bone pin was removed. In a medial trajectory, the combination was advanced until the tip of the working cannula was inside the posterior one-third at L1. Through the working cannula, a Kyphon inflatable bone tamp 3 x 20 was advanced and positioned with the distal marker 5 mm from the anterior aspect of L1. Crossing of the midline was seen on the AP projection. At this time, the balloon was expanded using contrast via a Kyphon inflation syringe device via microtubing. Inflations were continued until there was apposition with the superior endplate. At this time, methylmethacrylate mixture was reconstituted with Tobramycin in the Kyphon bone mixing device system. This was then loaded onto the Kyphon bone fillers. The balloon was deflated and removed followed by the instillation of 3-1/2 bone filler equivalents of methylmethacrylate mixture at L1  with excellent filling in the AP and lateral projections. No extravasation was noted in the disk spaces or posteriorly into the spinal canal. No epidural venous contamination was seen. The working cannula and the bone filler were then retrieved and removed. Hemostasis was achieved at the skin entry site. The patient was then transported back her room in a safe and stable condition. IMPRESSION: 1. Status post vertebral body augmentation using balloon kyphoplasty at L1 as described without event. CLINICAL DATA:  Severe low back pain secondary to compression fracture at L1. Electronically Signed   By: Julieanne Cotton M.D.   On: 11/24/2016 14:07   Dg Knee 3 Views Right  Result Date: 11/09/2016 Clinical:  Right knee pain, no trauma X-rays were done of the right knee, three views. There is mild degenerative changes of the right knee with more medial narrowing.  No fracture or loose body is present.  Bone quality is good. Impression:  Mild degenerative changes of the right knee. Electronically Signed Darreld Mclean, MD 7/12/20188:05 AM    Assessment/Plan  #1-history of compression fracture L1 with kyphoplasty-she appears to be doing relatively well with this pain is an issue she is concerned that she is not receiving her muscle relaxer Soma-have discussed this with nursing staff as well as with patient-nursing staff is also discuss this with patient that she will need to ask for this.  She is on Vicodin every 6 hours when necessary as well-she says she has been on all of these fairly routinely at home and tolerated well without respiratory depression or sedation.  She also received Klonopin at night but says she has done well with this as well.  She is quite adamant she would like to stick to the medication schedule she was on at home but does realize she'll have to ask for some of these medications.  #2-history of anxiety again she is on Klonopin daily at bedtime and when necessary during the day she is  been on this long-term as well.  #3 constipation continues on MiraLAX apparently she been on prune juice at home at this point will monitor.  #4 history of "nervous" stomach--chronic abdominal discomfort-she does receive Zofran as needed and apparently has tolerated this well .  #5 history of hyperlipidemia she does not want to take the Lipitor says she was not  taking for some time Will DC this-at 83 years old I suspect we will be pretty conservative here.  #6 history of glaucoma-do note she is on Patonal  Eyedrops--apparently has been followed by ophthalmology as needed  Labs today were reassuring hemoglobin was stable at 11.8 white count was normal at 7.2 renal function appears to be stable we will update this in approximately a week.  [id number] note greater than 35 minutes spent assessing patient-reviewing her chart-reviewing the labs-discussing patient's concerns at bedside with patient and her daughter-as well as with nursing staff-of note greater than 50% of time spent  coordinating plan of care

## 2016-11-29 ENCOUNTER — Non-Acute Institutional Stay (SKILLED_NURSING_FACILITY): Payer: Medicare Other | Admitting: Internal Medicine

## 2016-11-29 ENCOUNTER — Encounter: Payer: Self-pay | Admitting: Internal Medicine

## 2016-11-29 DIAGNOSIS — S32010A Wedge compression fracture of first lumbar vertebra, initial encounter for closed fracture: Secondary | ICD-10-CM | POA: Diagnosis not present

## 2016-11-29 DIAGNOSIS — R110 Nausea: Secondary | ICD-10-CM | POA: Diagnosis not present

## 2016-11-29 DIAGNOSIS — R238 Other skin changes: Secondary | ICD-10-CM

## 2016-11-29 DIAGNOSIS — R296 Repeated falls: Secondary | ICD-10-CM | POA: Diagnosis not present

## 2016-11-29 NOTE — Progress Notes (Signed)
Provider: Marga Melnick MD  Location:   [facility] Room Number: 152/D Place of Service:  SNF [phone])  PCP: Bernerd Limbo, MD Patient Care Team: Bernerd Limbo, MD as PCP - General (Internal Medicine) Jena Gauss, Gerrit Friends, MD as Consulting Physician (Gastroenterology)  Extended Emergency Contact Information Primary Emergency Contact: Woods,Vilma Address: [address]          [place], [place] [address] [place] Home Phone: [phone] Mobile Phone: [phone] Relation: Daughter Secondary Emergency Contact: Tillman [place] of [place] Home Phone: [phone] Relation: Grandson  Code Status: DNR Goals of Care: Advanced Directive information Advanced Directives 11/29/2016  Does Patient Have a Medical Advance Directive? Yes  Type of Advance Directive Out of facility DNR (pink MOST or yellow form)  Does patient want to make changes to medical advance directive? No - Patient declined  Would patient like information on creating a medical advance directive? No - Patient declined  Pre-existing out of facility DNR order (yellow form or pink MOST form) -    Chief Complaint  Patient presents with  . New Admit To SNF  HPI: Patient is a 83 y.o. female seen today for admission to [facility] SN[facility] after being hospitalized 7/23-7/27/18 with acute first lumbar compression fracture for which kyphoplasty was performed 7/27. She denied any cardiac or neurologic prodrome prior to the fall. Apparently she turned quickly in her bedroom and fell landing on her buttocks. She states that she is unsteady on her feet and tends to retropulse if she does not "get on my toes". She does describe occasional dizziness without frank vertigo. The patient was discharged to SNF for rehabilitation. The family felt they were unable to meet the patient's care needs at home. The compression fracture was in the context of osteoporosis.  Past Medical History:  Diagnosis  Date  . Constipation   . Glaucoma   . High cholesterol   . Osteoporosis   . Shortness of breath on exertion   Other diagnoses include constipation and depression.She states L cheek surgery was for "cancer" Past Surgical History:  Procedure Laterality Date  . CATARACT EXTRACTION W/PHACO Right 01/20/2013   Procedure: CATARACT EXTRACTION PHACO AND INTRAOCULAR LENS PLACEMENT (IOC);  Surgeon: Gemma Payor, MD;  Location: [facility];  Service: Ophthalmology;  Laterality: Right;  CDE:  18.27  . CESAREAN SECTION    . EYE SURGERY    . IR KYPHO LUMBAR INC FX REDUCE BONE BX UNI/BIL CANNULATION INC/IMAGING  11/24/2016  . ORIF HIP FRACTURE Right 05/28/2014   Procedure: OPEN REDUCTION INTERNAL FIXATION HIP;  Surgeon: Darreld Mclean, MD;  Location: [facility];  Service: Orthopedics;  Laterality: Right;  . 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 Her smokeless tobacco use includes Snuff. She reports that she does not drink alcohol or use drugs. Social History   Social History  . Marital status: Widowed    Spouse name: N/A  . Number of children: N/A  . Years of education: N/A   Occupational History  . Not on file.   Social History Main Topics  . Smoking status: Never Smoker  . Smokeless tobacco: Current User    Types: Snuff  . Alcohol use No  . Drug use: No  . Sexual activity: No   Other Topics Concern  . Not on file   Social History Narrative  . No narrative on file  Functional Status Survey:Requires  help for activities of daily living due to acute lumbar compression fracture.    Family History  Problem Relation Age of Onset  . Colon cancer Neg Hx     Health Maintenance  Topic Date Due  . INFLUENZA VACCINE  03/31/2017 (Originally 11/29/2016)  . PNA vac Low Risk Adult (1 of 2 - PCV13) 03/31/2017 (Originally 04/25/1989)  . TETANUS/TDAP  10/11/2026  . DEXA SCAN  Completed    No Known Allergies  Outpatient Encounter  Prescriptions as of 11/29/2016  Medication Sig  . aspirin EC 81 MG tablet Take 81 mg by mouth every morning.  . carisoprodol (SOMA) 350 MG tablet Take 350 mg by mouth 4 (four) times daily as needed for muscle spasms.  . clonazePAM (KLONOPIN) 0.5 MG tablet Take 0.5 mg by mouth at bedtime. Take 1 tablet by mouth once a day for anxiety for 14 days until 12/07/2016  . dimenhyDRINATE (TRAVEL SICKNESS) 50 MG tablet Take 50 mg by mouth every 8 (eight) hours as needed.  Marland Kitchen. HYDROcodone-acetaminophen (NORCO/VICODIN) 5-325 MG tablet Take 1 tablet by mouth every 6 (six) hours as needed for moderate pain (Must last 14 days.Do not take and drive a car or use machinery.).  Marland Kitchen. olopatadine (PATANOL) 0.1 % ophthalmic solution Place 1 drop into both eyes 2 (two) times daily.   . ondansetron (ZOFRAN) 4 MG tablet Take 4 mg by mouth every 8 (eight) hours as needed for nausea or vomiting.   . polyethylene glycol (MIRALAX / GLYCOLAX) packet Take 17 g by mouth daily.  . [DISCONTINUED] atorvastatin (LIPITOR) 20 MG tablet   . [DISCONTINUED] clonazePAM (KLONOPIN) 0.5 MG tablet Take 1 tablet (0.5 mg total) by mouth at bedtime. *May take one to two tablets daily as needed For anxiety (Patient taking differently: Take 0.5 mg by mouth at bedtime. *May take one tablet daily as needed For anxiety)   No facility-administered encounter medications on file as of 11/29/2016.    Review of Systems is generally positive. She describes nausea due to the back pain. She's had chronic constipation since she had hemorrhoid surgery "years ago". Apparently she may have developed a stricture. She also has chronic, recurrent left lower quadrant pain which has been evaluated & for which "nothing can be done". She describes chest pain mainly related to her bra being "too tight" because of her "long breasts". She has occasional hand cramps and numbness. She has intermittent jerking of the left lower extremity. She also has some incontinence as well as  nocturia.  Constitutional: No fever,significant weight change  Eyes: No redness, discharge, pain, vision change ENT/mouth: No nasal congestion,  purulent discharge, earache,change in hearing ,sore throat  Cardiovascular: No palpitations,paroxysmal nocturnal dyspnea, claudication, edema  Respiratory: No cough, sputum production,hemoptysis, DOE , significant snoring,apnea  Gastrointestinal: No heartburn,dysphagia, vomiting,rectal bleeding, melena Genitourinary: No dysuria,hematuria, pyuria,  Musculoskeletal: No joint swelling, weakness Dermatologic: No rash, pruritus, change in appearance of skin Neurologic: No dizziness,headache,syncope, seizures, tingling Psychiatric: No significant anxiety , depression, insomnia, anorexia Endocrine: No change in hair/skin/ nails, excessive thirst, excessive hunger, excessive urination  Hematologic/lymphatic: No significant bruising, lymphadenopathy,abnormal bleeding Allergy/immunology: No itchy/ watery eyes, significant sneezing, urticaria, angioedema  Vitals:   11/29/16 1122  BP: (!) 102/54  Pulse: 79  Resp: 20  Temp: (!) 97.1 F (36.2 C)  TempSrc: Oral  SpO2: 95%   There is no height or weight on file to calculate BMI. Physical Exam Pertinent or positive findings: She appears frail. Hair is thin with pattern alopecia. She has decreased  auditory acuity. There is irregular faint erythema of the eyelids especially the left lower lid. Arcus senilis. Slight anisocoriai is suggested with the left pupil greater than the right. Complete dentures. There is accentuated curvature of the thoracic spine. First heart sound is accentuated. Abdomen is protuberant. The left dorsalis pedis pulse is strong, the others are difficult to palpate. She has atrophy of the limbs, especially legs yet strength is good. 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  Bruises present of the left shin. There is a vitiliginous operative scar over the left cheek with a  papular erythematous lesion anteriorly.   General appearance:no acute distress , increased work of breathing is present.   Lymphatic: No lymphadenopathy about the head, neck, axilla . Eyes: No conjunctival inflammation or lid edema is present. There is no scleral icterus. Ears:  External ear exam shows no significant lesions or deformities.   Nose:  External nasal examination shows no deformity or inflammation. Nasal mucosa are pink and moist without lesions ,exudates Oral exam: lips and gums are healthy appearing.There is no oropharyngeal erythema or exudate . Neck:  No thyromegaly, masses, tenderness noted.    Heart:  Normal rate and regular rhythm.  S2 normal without gallop, murmur, click, rub .  Lungs: without wheezes, rhonchi,rales , rubs. Abdomen:Bowel sounds are normal. Abdomen is soft and nontender with no organomegaly, hernias,masses. GU: deferred  Extremities:  No cyanosis, clubbing,edema  Neurologic exam : Balance,Rhomberg,finger to nose testing could not be completed due to clinical state Deep tendon reflexes are equal Skin: Warm & dry w/o tenting. No significant rash.  Labs reviewed: Basic Metabolic Panel:  Recent Labs  29/52/84 0659 11/22/16 0643 11/27/16 0430  NA 145 143 140  K 3.8 3.6 3.7  CL 111 109 106  CO2 27 26 25   GLUCOSE 94 94 95  BUN 8 11 20   CREATININE 0.91 0.82 0.70  CALCIUM 9.1 9.0 9.1   Liver Function Tests:  Recent Labs  11/20/16 1723  AST 17  ALT 13*  ALKPHOS 69  BILITOT 0.6  PROT 7.1  ALBUMIN 4.0    Recent Labs  11/20/16 1723  LIPASE 55*   No results for input(s): AMMONIA in the last 8760 hours. CBC:  Recent Labs  11/20/16 1723 11/21/16 0659 11/22/16 0643 11/27/16 0430  WBC 10.9* 8.0 8.3 7.2  NEUTROABS 8.9*  --  5.3  --   HGB 12.7 11.7* 12.3 11.8*  HCT 40.4 37.3 39.1 36.9  MCV 94.4 94.7 94.9 94.1  PLT 205 195 198 198   Cardiac Enzymes:  Recent Labs  03/23/16 1504  TROPONINI 0.04*   BNP: Invalid input(s):  POCBNP No results found for: HGBA1C Lab Results  Component Value Date   TSH 1.337 03/23/2016   No results found for: VITAMINB12 No results found for: FOLATE No results found for: IRON, TIBC, FERRITIN  Imaging and Procedures obtained prior to SNF admission: Ir Kypho Lumbar Inc Fx Reduce Bone Bx Uni/bil Cannulation Inc/imaging  Result Date: 11/27/2016 INDICATION: Severe low back pain secondary to compression fracture at L1. EXAM: BALLOON KYPHOPLASTY AT L1 COMPARISON:  MRI of the lumbosacral spine of 11/21/2016. MEDICATIONS: As antibiotic prophylaxis, Ancef 2 g IV was ordered pre-procedure and administered intravenously within 1 hour of incision. ANESTHESIA/SEDATION: Moderate (conscious) sedation was employed during this procedure. A total of Versed 2 mg and Fentanyl 100 mcg was administered intravenously. Moderate Sedation Time: 25 minutes. The patient's level of consciousness and vital signs were monitored continuously by radiology nursing  throughout the procedure under my direct supervision. FLUOROSCOPY TIME:  Fluoroscopy Time: 9 minutes 18 seconds (686 mGy) COMPLICATIONS: None immediate. PROCEDURE: Following a full explanation of the procedure along with the potential associated complications, an informed witnessed consent was obtained. The patient was placed prone on the fluoroscopic table. The skin overlying the lumbar region was then prepped and draped in the usual sterile fashion. The right pedicle at L1 was then infiltrated with 0.25% bupivacaine followed by the advancement of an 11-gauge Jamshidi needle through the right pedicle into the posterior one-third at L1. This was then exchanged for a Kyphon advanced osteo introducer system comprised of a working cannula and a Kyphon osteo drill. This combination was then advanced over a Kyphon osteo bone pin until the tip of the Kyphon osteo drill was in the posterior third at L1. At this time, the bone pin was removed. In a medial trajectory, the  combination was advanced until the tip of the working cannula was inside the posterior one-third at L1. Through the working cannula, a Kyphon inflatable bone tamp 3 x 20 was advanced and positioned with the distal marker 5 mm from the anterior aspect of L1. Crossing of the midline was seen on the AP projection. At this time, the balloon was expanded using contrast via a Kyphon inflation syringe device via microtubing. Inflations were continued until there was apposition with the superior endplate. At this time, methylmethacrylate mixture was reconstituted with Tobramycin in the Kyphon bone mixing device system. This was then loaded onto the Kyphon bone fillers. The balloon was deflated and removed followed by the instillation of 3-1/2 bone filler equivalents of methylmethacrylate mixture at L1 with excellent filling in the AP and lateral projections. No extravasation was noted in the disk spaces or posteriorly into the spinal canal. No epidural venous contamination was seen. The working cannula and the bone filler were then retrieved and removed. Hemostasis was achieved at the skin entry site. The patient was then transported back her room in a safe and stable condition. IMPRESSION: 1. Status post vertebral body augmentation using balloon kyphoplasty at L1 as described without event. CLINICAL DATA:  Severe low back pain secondary to compression fracture at L1. Electronically Signed   By: Julieanne CottonSanjeev  Deveshwar M.D.   On: 11/24/2016 14:07    Assessment/Plan: See assessment and plan under each diagnosis in the problem list and acutely for this visit    Family/ staff Communication:   Labs/tests ordered: PPI trial L papular cheek lesion will need reassessment post discharge

## 2016-11-29 NOTE — Assessment & Plan Note (Signed)
Opioid ordered every 6 hours as needed, problematic may be exacerbation of her constipation with this medication

## 2016-11-29 NOTE — Patient Instructions (Addendum)
See assessment and plan under each diagnosis in the problem list and acutely for this visit PCP can reassess L cheek lesion post discharge from SNF.

## 2016-11-29 NOTE — Assessment & Plan Note (Signed)
11/29/16 patient describes retropulsion pattern as prelude to falls, no cardiac or neurologic prodrome described No definite parkinsonian picture clinically

## 2016-12-01 ENCOUNTER — Encounter: Payer: Self-pay | Admitting: Internal Medicine

## 2016-12-01 DIAGNOSIS — R238 Other skin changes: Secondary | ICD-10-CM | POA: Insufficient documentation

## 2016-12-01 NOTE — Assessment & Plan Note (Signed)
L cheek papular lesion anterior to vitiliginous scar needs reassessment post discharge from SNF

## 2016-12-04 ENCOUNTER — Encounter (HOSPITAL_COMMUNITY)
Admission: RE | Admit: 2016-12-04 | Discharge: 2016-12-04 | Disposition: A | Discharge status: Home / Self Care | Payer: Medicare Other | Source: Skilled Nursing Facility | Attending: Internal Medicine | Admitting: Internal Medicine

## 2016-12-04 DIAGNOSIS — R69 Illness, unspecified: Secondary | ICD-10-CM | POA: Insufficient documentation

## 2016-12-04 LAB — CBC WITH DIFFERENTIAL/PLATELET
Basophils Absolute: 0 10*3/uL (ref 0.0–0.1)
Basophils Relative: 0 %
Eosinophils Absolute: 0.2 10*3/uL (ref 0.0–0.7)
Eosinophils Relative: 2 %
HEMATOCRIT: 37 % (ref 36.0–46.0)
HEMOGLOBIN: 11.7 g/dL — AB (ref 12.0–15.0)
Lymphocytes Relative: 13 %
Lymphs Abs: 1.3 10*3/uL (ref 0.7–4.0)
MCH: 30.4 pg (ref 26.0–34.0)
MCHC: 31.6 g/dL (ref 30.0–36.0)
MCV: 96.1 fL (ref 78.0–100.0)
MONO ABS: 1 10*3/uL (ref 0.1–1.0)
MONOS PCT: 10 %
NEUTROS ABS: 7.7 10*3/uL (ref 1.7–7.7)
NEUTROS PCT: 75 %
Platelets: 224 10*3/uL (ref 150–400)
RBC: 3.85 MIL/uL — ABNORMAL LOW (ref 3.87–5.11)
RDW: 13.8 % (ref 11.5–15.5)
WBC: 10.2 10*3/uL (ref 4.0–10.5)

## 2016-12-04 LAB — BASIC METABOLIC PANEL
ANION GAP: 10 (ref 5–15)
BUN: 23 mg/dL — ABNORMAL HIGH (ref 6–20)
CO2: 27 mmol/L (ref 22–32)
Calcium: 9.3 mg/dL (ref 8.9–10.3)
Chloride: 105 mmol/L (ref 101–111)
Creatinine, Ser: 0.76 mg/dL (ref 0.44–1.00)
GFR calc non Af Amer: >60 mL/min (ref >60)
GLUCOSE: 75 mg/dL (ref 65–99)
POTASSIUM: 3.8 mmol/L (ref 3.5–5.1)
Sodium: 142 mmol/L (ref 135–145)

## 2016-12-08 ENCOUNTER — Other Ambulatory Visit: Payer: Self-pay

## 2016-12-08 MED ORDER — CLONAZEPAM 0.5 MG PO TABS: 0.5000 mg | ORAL_TABLET | Freq: Every day | ORAL | 0 refills | Status: DC

## 2016-12-08 MED ORDER — HYDROCODONE-ACETAMINOPHEN 5-325 MG PO TABS: 1.0000 | ORAL_TABLET | Freq: Four times a day (QID) | ORAL | 0 refills | Status: DC | PRN

## 2016-12-08 NOTE — Telephone Encounter (Signed)
RX Fax for [facility]@ [phone]  

## 2016-12-13 ENCOUNTER — Non-Acute Institutional Stay (SKILLED_NURSING_FACILITY): Payer: Medicare Other | Admitting: Internal Medicine

## 2016-12-13 ENCOUNTER — Encounter: Payer: Self-pay | Admitting: Internal Medicine

## 2016-12-13 DIAGNOSIS — K5900 Constipation, unspecified: Secondary | ICD-10-CM | POA: Diagnosis not present

## 2016-12-13 DIAGNOSIS — F419 Anxiety disorder, unspecified: Secondary | ICD-10-CM

## 2016-12-13 DIAGNOSIS — S32010A Wedge compression fracture of first lumbar vertebra, initial encounter for closed fracture: Secondary | ICD-10-CM | POA: Diagnosis not present

## 2016-12-13 DIAGNOSIS — R238 Other skin changes: Secondary | ICD-10-CM

## 2016-12-13 DIAGNOSIS — R109 Unspecified abdominal pain: Secondary | ICD-10-CM

## 2016-12-13 NOTE — Progress Notes (Signed)
Location:   [facility] Room Number: 152/D Place of Service:  SNF (31)  Provider: Edmon Crape  PCP: Bernerd Limbo, MD Patient Care Team: Bernerd Limbo, MD as PCP - General (Internal Medicine) Jena Gauss Gerrit Friends, MD as Consulting Physician (Gastroenterology)  Extended Emergency Contact Information Primary Emergency Contact: Woods,Vilma Address: [address]          [place], [place] [address] [place] Home Phone: [phone] Mobile Phone: [phone] Relation: Daughter Secondary Emergency Contact: Tillman [place] Home Phone: [phone] Relation: Grandson  Code Status: DNR Goals of care:  Advanced Directive information Advanced Directives 12/13/2016  Does Patient Have a Medical Advance Directive? Yes  Type of Advance Directive Out of facility DNR (pink MOST or yellow form)  Does patient want to make changes to medical advance directive? No - Patient declined  Would patient like information on creating a medical advance directive? No - Patient declined  Pre-existing out of facility DNR order (yellow form or pink MOST form) -     No Known Allergies  Chief Complaint  Patient presents with  . Discharge Note    HPI:  83 y.o. female  she will today for discharge from facility later this week.  Who is here for strengthening and rehabilitation after hospitalization for increased back pain-we're CT scan showed a lumbar 1 compression fracture.  MRI confirmed the fracture.  She did receive a kyphoplasty postoperative course was unremarkable pain medications were adjusted-she is on hydrocodone every 6 hours when necessary also on Soma 4 times a day when necessary she has been on this long-term and is quite insistent she continues to need this.  At this point pain appears to be relatively well controlled she has worked with therapy is ambulating greater than 100 feet with a rolling walker-continues to have  some frailty however will need continued PT and OT.  She will have hired help at home for 4 hours 5 days a week-her niece helps-- her daughter also is very supportive and tries to be there when she is not workin  Currently she is in her room eating her dinner-[place] complain at times of some abdominal discomfort she's been started on a proton pump inhibitor although unclear whether this is helping much she does have somewhat chronic complaints.  In regards to other issue she does have a history of anxiety is on Klonopin at night and once a day when necessary during the day she has been on this long-term and again is quite adamant she needs discontinued.  She also has a history of constipation apparently has been chronic she is on MiraLAX-apparently likes to take prune juice as well.  Dr. Alwyn Ren did note a small papule on her left cheek on exam she was admitted facility and recommends follow-up by primary care provider of this.    .    Past Medical History:  Diagnosis Date  . Constipation   . Glaucoma   . High cholesterol   . Osteoporosis   . Shortness of breath on exertion     Past Surgical History:  Procedure Laterality Date  . CATARACT EXTRACTION W/PHACO Right 01/20/2013   Procedure: CATARACT EXTRACTION PHACO AND INTRAOCULAR LENS PLACEMENT (IOC);  Surgeon: Gemma Payor, MD;  Location: [facility];  Service: Ophthalmology;  Laterality: Right;  CDE:  18.27  . CESAREAN SECTION    . EYE SURGERY    . IR KYPHO LUMBAR INC FX REDUCE BONE BX UNI/BIL CANNULATION INC/IMAGING  11/24/2016  . ORIF HIP FRACTURE Right 05/28/2014   Procedure: OPEN REDUCTION INTERNAL FIXATION HIP;  Surgeon: Darreld McleanWayne Keeling, MD;  Location: [facility];  Service: Orthopedics;  Laterality: Right;  . TONSILLECTOMY        reports that she has never smoked. Her smokeless tobacco use includes Snuff. She reports that she does not drink alcohol or use drugs. Social History   Social History  . Marital status: Widowed    Spouse  name: N/A  . Number of children: N/A  . Years of education: N/A   Occupational History  . Not on file.   Social History Main Topics  . Smoking status: Never Smoker  . Smokeless tobacco: Current User    Types: Snuff  . Alcohol use No  . Drug use: No  . Sexual activity: No   Other Topics Concern  . Not on file   Social History Narrative  . No narrative on file   Functional Status Survey:    No Known Allergies  Pertinent  Health Maintenance Due  Topic Date Due  . INFLUENZA VACCINE  03/31/2017 (Originally 11/29/2016)  . PNA vac Low Risk Adult (1 of 2 - PCV13) 03/31/2017 (Originally 04/25/1989)  . DEXA SCAN  Completed    Medications: Outpatient Encounter Prescriptions as of 12/13/2016  Medication Sig  . aspirin EC 81 MG tablet Take 81 mg by mouth every morning.  . carisoprodol (SOMA) 350 MG tablet Take 350 mg by mouth 4 (four) times daily as needed for muscle spasms.  . clonazePAM (KLONOPIN) 0.5 MG tablet Take 1 tablet (0.5 mg total) by mouth at bedtime. Take 1 tablet by mouth once a day for anxiety for 14 days until 12/07/2016  . dimenhyDRINATE (TRAVEL SICKNESS) 50 MG tablet Take 50 mg by mouth every 8 (eight) hours as needed.  Marland Kitchen. HYDROcodone-acetaminophen (NORCO/VICODIN) 5-325 MG tablet Take 1 tablet by mouth every 6 (six) hours as needed for moderate pain (Must last 14 days.Do not take and drive a car or use machinery.).  Marland Kitchen. olopatadine (PATANOL) 0.1 % ophthalmic solution Place 1 drop into both eyes 2 (two) times daily.   Marland Kitchen. omeprazole (PRILOSEC) 20 MG capsule Take 20 mg by mouth daily.  . ondansetron (ZOFRAN) 4 MG tablet Take 4 mg by mouth every 8 (eight) hours as needed for nausea or vomiting.   . polyethylene glycol (MIRALAX / GLYCOLAX) packet Take 17 g by mouth daily.   No facility-administered encounter medications on file as of 12/13/2016.      Review of Systems    In general she not complaining of any fever or chills.  Skin does not complain of rashes or itching  again does have papule left cheek that has been assessed by Dr. Alwyn RenHopper with recommendation for follow-up by primary care provider.  Head ears eyes nose mouth and throat does not complaining of any visual changes or sore throat.  Does not complain of difficulty swallowing.  Respiratory is not complaining of shortness breath or cough.  Cardiac does not complain of chest pain and does not have significant lower extremity edema.  GI d Continues to complain at times of an upset stomach feeling she's been started on Prilosec with questionable effectiveness-. This is been a chronic long-term situation at times does complain of constipation does not complain however of acute abdominal discomfort nausea or vomiting  GU does not complain of dysuria.  Muscle skeletal Pain complaints appear to be improved compared to initial exam apparently still has some discomfort but apparently  the Vicodin and Soma help--is not complaining of acute sounding back pain.  Neurologic is not complaining of dizziness headache or syncope.   psych does not complain of any overt anxiety or depressive symptoms   Vitals:   12/13/16 1326  BP: 128/68  Pulse: 68  Resp: (!) 22  Temp: (!) 97.4 F (36.3 C)  TempSrc: Oral  SpO2: 95%    Physical Exam Gen. this is a  frail elderly female in no distress she is bright alert eating her dinner.  Her skin is warm and dry kyphoplasty site lower back midline appears benign without any sign of infection there is just a very minimal incision site.-   a small papule left cheek as noted above  Eyes pupils appear reactive light sclera and conjunctivae clear she has prescription lenses visual acuity appears grossly intact.  Oropharynx is clear mucous membranes moist.  Chest is clear to auscultation with shallow air entry there is no labored breathing there is kyphosis   Heart is regular rate and rhythm without murmur gallop or rub she does not have significant  lower extremity edema she has somewhat reduced pedal pulses bilaterally.  Abdomen is soft nontender with positive bowel sounds.  Musculoskeletal is able to move all extremities 4 to appears with appropriate strength in all 4 extremities. She does appear to have age-related arthritic changes most prominently of her hands  She does have arthritic changes of her legs and arms hands which appear to be age-appropriate.  Neurologic is grossly intact her speech is clear no lateralizing findings. Touch   sensation lower extremities appears to be intact  Psych she is alert and oriented pleasant and appropriate  Labs reviewed: Basic Metabolic Panel:  Recent Labs  16/10/96 0643 11/27/16 0430 12/04/16 1000  NA 143 140 142  K 3.6 3.7 3.8  CL 109 106 105  CO2 26 25 27   GLUCOSE 94 95 75  BUN 11 20 23*  CREATININE 0.82 0.70 0.76  CALCIUM 9.0 9.1 9.3   Liver Function Tests:  Recent Labs  11/20/16 1723  AST 17  ALT 13*  ALKPHOS 69  BILITOT 0.6  PROT 7.1  ALBUMIN 4.0    Recent Labs  11/20/16 1723  LIPASE 55*   No results for input(s): AMMONIA in the last 8760 hours. CBC:  Recent Labs  11/20/16 1723  11/22/16 0643 11/27/16 0430 12/04/16 1000  WBC 10.9*  < > 8.3 7.2 10.2  NEUTROABS 8.9*  --  5.3  --  7.7  HGB 12.7  < > 12.3 11.8* 11.7*  HCT 40.4  < > 39.1 36.9 37.0  MCV 94.4  < > 94.9 94.1 96.1  PLT 205  < > 198 198 224  < > = values in this interval not displayed. Cardiac Enzymes:  Recent Labs  03/23/16 1504  TROPONINI 0.04*   BNP: Invalid input(s): POCBNP CBG: No results for input(s): GLUCAP in the last 8760 hours.  Procedures and Imaging Studies During Stay: Mr Lumbar Spine Wo Contrast  Result Date: 11/21/2016 CLINICAL DATA:  Low back pain and bilateral leg weakness. The patient fell from her bed 1 week ago. EXAM: MRI LUMBAR SPINE WITHOUT CONTRAST TECHNIQUE: Multiplanar, multisequence MR imaging of the lumbar spine was performed. No intravenous  contrast was administered. COMPARISON:  CT scan dated 11/20/2016 and 07/20/2015 and radiographs dated 11/11/2014 FINDINGS: Segmentation:  Standard. Alignment: 2 mm spondylolisthesis at L2-3, chronic, unchanged since 07/20/2015. Vertebrae: There is an acute mild compression fracture of the superior endplate of  L1 as demonstrated on the recent CT scan. There is no protrusion of bone into the spinal canal. Conus medullaris: Extends to the T12-L1 level and appears normal. Paraspinal and other soft tissues: Negative. Disc levels: T12-L1: Tiny central disc bulge without neural impingement, unchanged since 07/20/2015. No disc protrusion. Benign-appearing mild compression fracture of the superior endplate of L1 with no bone protrusion into the spinal canal. L1-2: Chronic broad-based disc protrusion slightly asymmetric to the left slightly compressing the left side of the thecal sac, unchanged since the prior CT scan of 07/20/2015. L2-3: Grade 1 spondylolisthesis with disc space narrowing with a small broad-based soft disc protrusion. Hypertrophy of the ligamentum flavum combine to create severe spinal stenosis. This has progressed since the prior study of 07/20/2015. Both lateral recesses are compressed, left more than right. L3-4: Chronic central soft disc extrusion extending superiorly behind the body of L3 in the midline with. Slight hypertrophy of the ligamentum flavum. Marked narrowing of the right lateral recess which could affect the right L4 nerve. These findings appear slightly more prominent than on the prior CT scan of 07/20/2015. Severe compression of the thecal sac. L4-5: Slight bulge of the disc and lateral to the right neural foramen without neural impingement. Minimal hypertrophy of the ligamentum flavum. No spinal stenosis. L5-S1: Slight disc desiccation without disc bulging or protrusion. No neural impingement. IMPRESSION: 1. Acute benign-appearing mild compression fracture of the superior endplate of L1  without neural impingement. 2. Severe spinal stenosis at L2-3 with lateral recess compression, left greater than right. 3. Severe compression of the thecal sac at L3-4 due to a chronic central disc extrusion, slightly progressed since 2017. Right lateral recess compression at L3-4. Electronically Signed   By: Francene Boyers M.D.   On: 11/21/2016 10:30   Ct Abdomen Pelvis W Contrast  Result Date: 11/20/2016 CLINICAL DATA:  falling at her bedside 1 week ago when she lost balance. Pt says she hurts "all over" and mostly in abdomen, back and side 10/10 pain. Pt says she feels constipated and drinks prune juice with no relief. LBM yesterday but was mostly loose EXAM: CT ABDOMEN AND PELVIS WITH CONTRAST TECHNIQUE: Multidetector CT imaging of the abdomen and pelvis was performed using the standard protocol following bolus administration of intravenous contrast. CONTRAST:  75mL ISOVUE-300 IOPAMIDOL (ISOVUE-300) INJECTION 61% COMPARISON:  07/20/2015 FINDINGS: Lower chest: Heavy mitral annulus calcifications. Atheromatous aorta. Hepatobiliary: No focal liver abnormality is seen. No gallstones, gallbladder wall thickening, or biliary dilatation. Pancreas: Unremarkable. No pancreatic ductal dilatation or surrounding inflammatory changes. Spleen: Normal in size without focal abnormality. Adrenals/Urinary Tract: Normal adrenals. Prominent extrarenal pelves left greater than right. No nephrolithiasis. No focal renal lesion. Some imaging degradation secondary to breathing motion. Stomach/Bowel: Stomach, small bowel, and colon are nondilated. The rectum is distended by fluid and gas. No wall thickening or regional inflammatory change. Scattered sigmoid diverticula. Vascular/Lymphatic: Moderate aortoiliac arterial calcifications without aneurysm or stenosis. Portal vein patent. No abdominal or pelvic adenopathy localized. Multiple pelvic phleboliths. Reproductive: Uterus and bilateral adnexa are unremarkable. Other: No ascites.   No free air. Musculoskeletal: Mild L1 compression deformity with less than 20% loss of height, no retropulsion, new since previous study. Old Schmorl's node in T11. Fixation hardware in the right femur. No worrisome bone lesion. IMPRESSION: 1. L1 compression fracture deformity, new since 07/20/2015, without complicating features. 2. No acute intraabdominal process. 3. Sigmoid diverticulosis. 4.  Aortic Atherosclerosis (ICD10-170.0) Electronically Signed   By: Corlis Leak M.D.   On: 11/20/2016 20:42  Ir Kypho Lumbar Inc Fx Reduce Bone Bx Uni/bil Cannulation Inc/imaging  Result Date: 11/27/2016 INDICATION: Severe low back pain secondary to compression fracture at L1. EXAM: BALLOON KYPHOPLASTY AT L1 COMPARISON:  MRI of the lumbosacral spine of 11/21/2016. MEDICATIONS: As antibiotic prophylaxis, Ancef 2 g IV was ordered pre-procedure and administered intravenously within 1 hour of incision. ANESTHESIA/SEDATION: Moderate (conscious) sedation was employed during this procedure. A total of Versed 2 mg and Fentanyl 100 mcg was administered intravenously. Moderate Sedation Time: 25 minutes. The patient's level of consciousness and vital signs were monitored continuously by radiology nursing throughout the procedure under my direct supervision. FLUOROSCOPY TIME:  Fluoroscopy Time: 9 minutes 18 seconds (686 mGy) COMPLICATIONS: None immediate. PROCEDURE: Following a full explanation of the procedure along with the potential associated complications, an informed witnessed consent was obtained. The patient was placed prone on the fluoroscopic table. The skin overlying the lumbar region was then prepped and draped in the usual sterile fashion. The right pedicle at L1 was then infiltrated with 0.25% bupivacaine followed by the advancement of an 11-gauge Jamshidi needle through the right pedicle into the posterior one-third at L1. This was then exchanged for a Kyphon advanced osteo introducer system comprised of a working  cannula and a Kyphon osteo drill. This combination was then advanced over a Kyphon osteo bone pin until the tip of the Kyphon osteo drill was in the posterior third at L1. At this time, the bone pin was removed. In a medial trajectory, the combination was advanced until the tip of the working cannula was inside the posterior one-third at L1. Through the working cannula, a Kyphon inflatable bone tamp 3 x 20 was advanced and positioned with the distal marker 5 mm from the anterior aspect of L1. Crossing of the midline was seen on the AP projection. At this time, the balloon was expanded using contrast via a Kyphon inflation syringe device via microtubing. Inflations were continued until there was apposition with the superior endplate. At this time, methylmethacrylate mixture was reconstituted with Tobramycin in the Kyphon bone mixing device system. This was then loaded onto the Kyphon bone fillers. The balloon was deflated and removed followed by the instillation of 3-1/2 bone filler equivalents of methylmethacrylate mixture at L1 with excellent filling in the AP and lateral projections. No extravasation was noted in the disk spaces or posteriorly into the spinal canal. No epidural venous contamination was seen. The working cannula and the bone filler were then retrieved and removed. Hemostasis was achieved at the skin entry site. The patient was then transported back her room in a safe and stable condition. IMPRESSION: 1. Status post vertebral body augmentation using balloon kyphoplasty at L1 as described without event. CLINICAL DATA:  Severe low back pain secondary to compression fracture at L1. Electronically Signed   By: Julieanne Cotton M.D.   On: 11/24/2016 14:07    Assessment/Plan:    #1-history of lumbar compression fracture status post Kyphoplasty this appears to be stable she does receive hydrocodone every 6 hours as needed for pain as well as Soma 4 times a day when necessary-at this point pain  appears to be relatively well controlled.  #2 history of anxiety she is on Klonopin at night and also once a day when necessary during the day she appears to have tolerated this I do not note any signs of oversedation or respiratory depression she apparently has been on the hydrocodone Soma and Klonopin for an extended period of time per her report and  tolerated this well-Will warrant follow up by primary care provider.  #3 history constipation she continues on MiraLAX-still at times will complain of constipation but apparently this is been a long-term issue she takes prune juice at home which gives her help as well.  #4-history of question GERD--chronic abdominal discomfort-upset stomach she's been started on a proton pump inhibitor although unclear if this has helped-again this will warrant follow up by primary care provider apparently stomach issues have been somewhat of a long-term issue as well.  #5 history of left cheek papule per Dr. Frederik Pear recommendation suggest follow-up by primary care provider to monitor this.  #6 hyperlipidemia her statin was discontinued per patient's wishes upon admission to the facility-again with her advanced age I suspect this would be quite reasonable    Clinically she appears to be frail but stable she will have hired help at home part of the day as well as help fro and her daughter who is very supportive-she is ambulatory with a walker but still continues to be quite weak-labs were reassuring-she will need continued PT OT as well as close follow-up by primary care provider.  [id number] note greater than 30 minutes spent on this discharge summary-greater than 50% of time spent coordinating plan of care for numerous diagnoses

## 2016-12-18 ENCOUNTER — Telehealth (HOSPITAL_COMMUNITY): Payer: Self-pay

## 2016-12-18 NOTE — Telephone Encounter (Signed)
Pt is doing better since kyphoplasty. Spoke with pt's home nurse. AW

## 2016-12-19 DIAGNOSIS — R279 Unspecified lack of coordination: Secondary | ICD-10-CM | POA: Diagnosis not present

## 2016-12-19 DIAGNOSIS — M25512 Pain in left shoulder: Secondary | ICD-10-CM | POA: Diagnosis not present

## 2016-12-19 DIAGNOSIS — S32010D Wedge compression fracture of first lumbar vertebra, subsequent encounter for fracture with routine healing: Secondary | ICD-10-CM | POA: Diagnosis not present

## 2016-12-19 DIAGNOSIS — G8929 Other chronic pain: Secondary | ICD-10-CM | POA: Diagnosis not present

## 2017-01-10 ENCOUNTER — Ambulatory Visit (INDEPENDENT_AMBULATORY_CARE_PROVIDER_SITE_OTHER): Payer: Medicare Other | Admitting: Orthopaedic Surgery

## 2017-01-10 VITALS — BP 111/60 | HR 86 | Temp 98.1°F | Wt 114.0 lb

## 2017-01-10 DIAGNOSIS — M25512 Pain in left shoulder: Secondary | ICD-10-CM

## 2017-01-10 DIAGNOSIS — G8929 Other chronic pain: Secondary | ICD-10-CM | POA: Diagnosis not present

## 2017-01-10 DIAGNOSIS — M25511 Pain in right shoulder: Secondary | ICD-10-CM

## 2017-01-10 MED ORDER — HYDROCODONE-ACETAMINOPHEN 5-325 MG PO TABS: 1.0000 | ORAL_TABLET | Freq: Four times a day (QID) | ORAL | 0 refills | Status: DC | PRN

## 2017-01-10 NOTE — Progress Notes (Signed)
Follow up.

## 2017-01-10 NOTE — Progress Notes (Signed)
Patient Alexandra Riley, female DOB:Sep 28, 1921, 83 y.o. EAV:[id number]  Chief Complaint  Patient presents with  . Follow-up   CC:  Low back pain and pain in both shoulders HPI  KENSLY BOWMER is a 83 y.o. female who has chronic pain of both shoulders and the lower back.  She has no new trauma.  She has pain with overhead use.  She has no weakness or paresthesias. HPI  Body mass index is 18.97 kg/m.  ROS  Review of Systems  Respiratory: Positive for shortness of breath.   Gastrointestinal: Positive for constipation.  Musculoskeletal: Positive for gait problem (needs walker.  History of falls.  Workup negative in past for cause of falls.).  Neurological: Positive for syncope and headaches.    Past Medical History:  Diagnosis Date  . Constipation   . Glaucoma   . High cholesterol   . Osteoporosis   . Shortness of breath on exertion     Past Surgical History:  Procedure Laterality Date  . CATARACT EXTRACTION W/PHACO Right 01/20/2013   Procedure: CATARACT EXTRACTION PHACO AND INTRAOCULAR LENS PLACEMENT (IOC);  Surgeon: Gemma Payor, MD;  Location: [facility];  Service: Ophthalmology;  Laterality: Right;  CDE:  18.27  . CESAREAN SECTION    . EYE SURGERY    . IR KYPHO LUMBAR INC FX REDUCE BONE BX UNI/BIL CANNULATION INC/IMAGING  11/24/2016  . ORIF HIP FRACTURE Right 05/28/2014   Procedure: OPEN REDUCTION INTERNAL FIXATION HIP;  Surgeon: Darreld Mclean, MD;  Location: [facility];  Service: Orthopedics;  Laterality: Right;  . TONSILLECTOMY      Family History  Problem Relation Age of Onset  . Colon cancer Neg Hx     Social History Social History  Substance Use Topics  . Smoking status: Never Smoker  . Smokeless tobacco: Current User    Types: Snuff  . Alcohol use No    No Known Allergies  Current Outpatient Prescriptions  Medication Sig Dispense Refill  . aspirin EC 81 MG tablet Take 81 mg by mouth every morning.    . carisoprodol (SOMA) 350 MG tablet Take 350 mg by mouth  4 (four) times daily as needed for muscle spasms.    . clonazePAM (KLONOPIN) 0.5 MG tablet Take 1 tablet (0.5 mg total) by mouth at bedtime. Take 1 tablet by mouth once a day for anxiety for 14 days until 12/07/2016 30 tablet 0  . dimenhyDRINATE (TRAVEL SICKNESS) 50 MG tablet Take 50 mg by mouth every 8 (eight) hours as needed.    Marland Kitchen HYDROcodone-acetaminophen (NORCO/VICODIN) 5-325 MG tablet Take 1 tablet by mouth every 6 (six) hours as needed for moderate pain (Must last 30 days.Do not take and drive a car or use machinery.). 40 tablet 0  . olopatadine (PATANOL) 0.1 % ophthalmic solution Place 1 drop into both eyes 2 (two) times daily.     Marland Kitchen omeprazole (PRILOSEC) 20 MG capsule Take 20 mg by mouth daily.    . ondansetron (ZOFRAN) 4 MG tablet Take 4 mg by mouth every 8 (eight) hours as needed for nausea or vomiting.     . polyethylene glycol (MIRALAX / GLYCOLAX) packet Take 17 g by mouth daily. 14 each 0   No current facility-administered medications for this visit.      Physical Exam  Blood pressure 111/60, pulse 86, temperature 98.1 F (36.7 C), weight 114 lb (51.7 kg).  Constitutional: overall normal hygiene, normal nutrition, well developed, normal grooming, normal body habitus. Assistive device:walker  Musculoskeletal: gait and station  Limp left, muscle tone and strength are normal, no tremors or atrophy is present.  .  Neurological: coordination overall normal.  Deep tendon reflex/nerve stretch intact.  Sensation normal.  Cranial nerves II-XII intact.   Skin:   Normal overall no scars, lesions, ulcers or rashes. No psoriasis.  Psychiatric: Alert and oriented x 3.  Recent memory intact, remote memory unclear.  Normal mood and affect. Well groomed.  Good eye contact.  Cardiovascular: overall no swelling, no varicosities, no edema bilaterally, normal temperatures of the legs and arms, no clubbing, cyanosis and good capillary refill.  Lymphatic: palpation is normal.  All other systems  reviewed and are negative   Examination of bilaterally Upper Extremity is done.  Inspection:   Overall:  Elbow non-tender without crepitus or defects, forearm non-tender without crepitus or defects, wrist non-tender without crepitus or defects, hand non-tender.    Shoulder: with glenohumeral joint tenderness, without effusion.   Upper arm: without swelling and tenderness   Range of motion:   Overall:  Full range of motion of the elbow, full range of motion of wrist and full range of motion in fingers.   Shoulder:  bilaterally  165 degrees forward flexion; 145 degrees abduction; 30 degrees internal rotation, 30 degrees external rotation, 15 degrees extension, 40 degrees adduction.   Stability:   Overall:  Shoulder, elbow and wrist stable   Strength and Tone:   Overall full shoulder muscles strength, full upper arm strength and normal upper arm bulk and tone.  The patient has been educated about the nature of the problem(s) and counseled on treatment options.  The patient appeared to understand what I have discussed and is in agreement with it.  Encounter Diagnoses  Name Primary?  . Chronic left shoulder pain Yes  . Chronic right shoulder pain    PROCEDURE NOTE:  The patient request injection, verbal consent was obtained.  The left shoulder was prepped appropriately after time out was performed.   Sterile technique was observed and injection of 1 cc of Depo-Medrol 40 mg with several cc's of plain xylocaine. Anesthesia was provided by ethyl chloride and a 20-gauge needle was used to inject the shoulder area. A posterior approach was used.  The injection was tolerated well.  A band aid dressing was applied.  The patient was advised to apply ice later today and tomorrow to the injection sight as needed.   PLAN Call if any problems.  Precautions discussed.  Continue current medications.   Return to clinic 3 months   I have reviewed the [facility] [place] web site prior to prescribing narcotic medicine for this patient.  Electronically Signed Darreld McleanWayne Felina Tello, MD 9/12/20182:16 PM

## 2017-01-12 ENCOUNTER — Emergency Department (HOSPITAL_COMMUNITY): Payer: Medicare Other

## 2017-01-12 ENCOUNTER — Encounter (HOSPITAL_COMMUNITY): Payer: Self-pay

## 2017-01-12 ENCOUNTER — Observation Stay (HOSPITAL_COMMUNITY)
Admission: EM | Admit: 2017-01-12 | Discharge: 2017-01-13 | Disposition: A | Discharge status: Home / Self Care | Payer: Medicare Other | Attending: Internal Medicine | Admitting: Internal Medicine

## 2017-01-12 DIAGNOSIS — M549 Dorsalgia, unspecified: Secondary | ICD-10-CM | POA: Diagnosis not present

## 2017-01-12 DIAGNOSIS — Y999 Unspecified external cause status: Secondary | ICD-10-CM | POA: Diagnosis not present

## 2017-01-12 DIAGNOSIS — S0990XA Unspecified injury of head, initial encounter: Secondary | ICD-10-CM | POA: Diagnosis present

## 2017-01-12 DIAGNOSIS — M546 Pain in thoracic spine: Secondary | ICD-10-CM | POA: Insufficient documentation

## 2017-01-12 DIAGNOSIS — Z79899 Other long term (current) drug therapy: Secondary | ICD-10-CM | POA: Insufficient documentation

## 2017-01-12 DIAGNOSIS — Y939 Activity, unspecified: Secondary | ICD-10-CM | POA: Diagnosis not present

## 2017-01-12 DIAGNOSIS — Y92009 Unspecified place in unspecified non-institutional (private) residence as the place of occurrence of the external cause: Secondary | ICD-10-CM | POA: Insufficient documentation

## 2017-01-12 DIAGNOSIS — W0110XA Fall on same level from slipping, tripping and stumbling with subsequent striking against unspecified object, initial encounter: Secondary | ICD-10-CM | POA: Diagnosis not present

## 2017-01-12 DIAGNOSIS — Z7982 Long term (current) use of aspirin: Secondary | ICD-10-CM | POA: Insufficient documentation

## 2017-01-12 DIAGNOSIS — F17220 Nicotine dependence, chewing tobacco, uncomplicated: Secondary | ICD-10-CM | POA: Insufficient documentation

## 2017-01-12 DIAGNOSIS — R52 Pain, unspecified: Secondary | ICD-10-CM | POA: Diagnosis present

## 2017-01-12 DIAGNOSIS — W19XXXA Unspecified fall, initial encounter: Secondary | ICD-10-CM

## 2017-01-12 LAB — CBC WITH DIFFERENTIAL/PLATELET
Basophils Absolute: 0 10*3/uL (ref 0.0–0.1)
Basophils Relative: 0 %
EOS ABS: 0.1 10*3/uL (ref 0.0–0.7)
EOS PCT: 1 %
HCT: 40 % (ref 36.0–46.0)
Hemoglobin: 12.9 g/dL (ref 12.0–15.0)
LYMPHS ABS: 1.4 10*3/uL (ref 0.7–4.0)
Lymphocytes Relative: 20 %
MCH: 30.4 pg (ref 26.0–34.0)
MCHC: 32.3 g/dL (ref 30.0–36.0)
MCV: 94.3 fL (ref 78.0–100.0)
MONO ABS: 0.7 10*3/uL (ref 0.1–1.0)
MONOS PCT: 10 %
Neutro Abs: 4.9 10*3/uL (ref 1.7–7.7)
Neutrophils Relative %: 69 %
PLATELETS: 193 10*3/uL (ref 150–400)
RBC: 4.24 MIL/uL (ref 3.87–5.11)
RDW: 13.9 % (ref 11.5–15.5)
WBC: 7.1 10*3/uL (ref 4.0–10.5)

## 2017-01-12 LAB — COMPREHENSIVE METABOLIC PANEL
ALT: 17 U/L (ref 14–54)
ANION GAP: 10 (ref 5–15)
AST: 23 U/L (ref 15–41)
Albumin: 4.3 g/dL (ref 3.5–5.0)
Alkaline Phosphatase: 66 U/L (ref 38–126)
BUN: 14 mg/dL (ref 6–20)
CALCIUM: 9.6 mg/dL (ref 8.9–10.3)
CHLORIDE: 108 mmol/L (ref 101–111)
CO2: 25 mmol/L (ref 22–32)
Creatinine, Ser: 0.89 mg/dL (ref 0.44–1.00)
GFR, EST NON AFRICAN AMERICAN: 55 mL/min — AB (ref >60)
Glucose, Bld: 95 mg/dL (ref 65–99)
Potassium: 3.6 mmol/L (ref 3.5–5.1)
SODIUM: 143 mmol/L (ref 135–145)
Total Bilirubin: 0.6 mg/dL (ref 0.3–1.2)
Total Protein: 7.3 g/dL (ref 6.5–8.1)

## 2017-01-12 LAB — URINALYSIS, ROUTINE W REFLEX MICROSCOPIC
BACTERIA UA: NONE SEEN
Bilirubin Urine: NEGATIVE
Glucose, UA: NEGATIVE mg/dL
Ketones, ur: NEGATIVE mg/dL
NITRITE: NEGATIVE
Protein, ur: NEGATIVE mg/dL
SPECIFIC GRAVITY, URINE: 1.008 (ref 1.005–1.030)
pH: 5 (ref 5.0–8.0)

## 2017-01-12 MED ORDER — ENSURE ENLIVE PO LIQD
237.0000 mL | Freq: Two times a day (BID) | ORAL | Status: DC
  Administered 2017-01-12 – 2017-01-13 (×2): 237 mL via ORAL

## 2017-01-12 MED ORDER — HYDROCODONE-ACETAMINOPHEN 5-325 MG PO TABS
1.0000 | ORAL_TABLET | Freq: Four times a day (QID) | ORAL | Status: DC | PRN
  Administered 2017-01-12 – 2017-01-13 (×3): 1 via ORAL
  Filled 2017-01-12 (×4): qty 1

## 2017-01-12 MED ORDER — ONDANSETRON HCL 4 MG PO TABS
4.0000 mg | ORAL_TABLET | Freq: Four times a day (QID) | ORAL | Status: DC | PRN
  Administered 2017-01-13: 4 mg via ORAL
  Filled 2017-01-12: qty 1

## 2017-01-12 MED ORDER — POLYETHYLENE GLYCOL 3350 17 G PO PACK
17.0000 g | PACK | Freq: Every day | ORAL | Status: DC
  Administered 2017-01-12 – 2017-01-13 (×2): 17 g via ORAL
  Filled 2017-01-12 (×2): qty 1

## 2017-01-12 MED ORDER — OLOPATADINE HCL 0.1 % OP SOLN
1.0000 [drp] | Freq: Two times a day (BID) | OPHTHALMIC | Status: DC
  Administered 2017-01-12 – 2017-01-13 (×2): 1 [drp] via OPHTHALMIC
  Filled 2017-01-12: qty 5

## 2017-01-12 MED ORDER — ONDANSETRON HCL 4 MG/2ML IJ SOLN
4.0000 mg | Freq: Once | INTRAMUSCULAR | Status: AC
  Administered 2017-01-12: 4 mg via INTRAVENOUS
  Filled 2017-01-12: qty 2

## 2017-01-12 MED ORDER — HYDROMORPHONE HCL 1 MG/ML IJ SOLN
INTRAMUSCULAR | Status: AC
  Administered 2017-01-12: 0.5 mg via INTRAVENOUS
  Filled 2017-01-12: qty 1

## 2017-01-12 MED ORDER — ONDANSETRON HCL 4 MG PO TABS: 4.0000 mg | ORAL_TABLET | Freq: Three times a day (TID) | ORAL | Status: DC | PRN

## 2017-01-12 MED ORDER — CARISOPRODOL 350 MG PO TABS
350.0000 mg | ORAL_TABLET | Freq: Four times a day (QID) | ORAL | Status: DC | PRN
  Administered 2017-01-13: 350 mg via ORAL
  Filled 2017-01-12: qty 1

## 2017-01-12 MED ORDER — HYDROMORPHONE HCL 1 MG/ML IJ SOLN
0.5000 mg | INTRAMUSCULAR | Status: DC | PRN
  Administered 2017-01-12 – 2017-01-13 (×4): 0.5 mg via INTRAVENOUS
  Filled 2017-01-12 (×4): qty 1

## 2017-01-12 MED ORDER — ASPIRIN EC 81 MG PO TBEC
81.0000 mg | DELAYED_RELEASE_TABLET | Freq: Every morning | ORAL | Status: DC
  Administered 2017-01-12 – 2017-01-13 (×2): 81 mg via ORAL
  Filled 2017-01-12 (×2): qty 1

## 2017-01-12 MED ORDER — HYDROMORPHONE HCL 1 MG/ML IJ SOLN
0.5000 mg | INTRAMUSCULAR | Status: AC | PRN
  Administered 2017-01-12 (×3): 0.5 mg via INTRAVENOUS
  Filled 2017-01-12 (×2): qty 1

## 2017-01-12 MED ORDER — PANTOPRAZOLE SODIUM 40 MG PO TBEC
40.0000 mg | DELAYED_RELEASE_TABLET | Freq: Every day | ORAL | Status: DC
  Administered 2017-01-12 – 2017-01-13 (×2): 40 mg via ORAL
  Filled 2017-01-12 (×2): qty 1

## 2017-01-12 MED ORDER — ONDANSETRON HCL 4 MG/2ML IJ SOLN: 4.0000 mg | Freq: Four times a day (QID) | INTRAMUSCULAR | Status: DC | PRN

## 2017-01-12 MED ORDER — CLONAZEPAM 0.5 MG PO TABS
0.5000 mg | ORAL_TABLET | Freq: Every day | ORAL | Status: DC
  Administered 2017-01-12: 0.5 mg via ORAL
  Filled 2017-01-12: qty 1

## 2017-01-12 MED ORDER — DIMENHYDRINATE 50 MG PO TABS: 50.0000 mg | ORAL_TABLET | Freq: Three times a day (TID) | ORAL | Status: DC | PRN

## 2017-01-12 MED ORDER — ENOXAPARIN SODIUM 40 MG/0.4ML ~~LOC~~ SOLN
40.0000 mg | Freq: Every day | SUBCUTANEOUS | Status: DC
  Administered 2017-01-12 – 2017-01-13 (×2): 40 mg via SUBCUTANEOUS
  Filled 2017-01-12 (×2): qty 0.4

## 2017-01-12 NOTE — ED Notes (Signed)
Lab at bedside

## 2017-01-12 NOTE — Care Management Obs Status (Addendum)
MEDICARE OBSERVATION STATUS NOTIFICATION   Patient Details  Name: Alexandra Riley MRN: [id number] Date of Birth: [birth year]   Medicare Observation Status Notification Given:  Yes Pt states she is unsure if she understands. CM offered to contact daughter to explain to her, daughter does not want daughter to worry over this, "she has enough going on". Verbal consent given.     Malcolm Metro, RN 01/12/2017, 3:00 PM

## 2017-01-12 NOTE — Care Management (Addendum)
Pt seen to administer OBS form. Pt from home, son (visiting from [place] for past 1-2 wks)  is staying with her, has daughter who is very active in her care also. Pt active with [facility], Jorja Loa, [facility] rep, aware pt under observation. CM consult for [facility] needs. PT eval pending. Will cont to follow. If pt ready for DC over weekend on call CM at [facility] may assist with DC needs.

## 2017-01-12 NOTE — ED Triage Notes (Signed)
Pt fell trying to get to the bathroom, fell backwards and landed on the floor.  Pt c/o pain to lower back with recent surgery to same.

## 2017-01-12 NOTE — ED Notes (Signed)
Pt able to ambulate a few steps with 2 person assist. Pt c/o back pain. RN aware.

## 2017-01-12 NOTE — ED Notes (Signed)
Pt changed into dry diaper.

## 2017-01-12 NOTE — Care Management Obs Status (Addendum)
MEDICARE OBSERVATION STATUS NOTIFICATION   Patient Details  Name: Alexandra Riley MRN: [id number] Date of Birth: August 25, 1921   Medicare Observation Status Notification Given:  Yes (Pt states she is unsure if she understands. CM offered to contact daughter to explain to her, daughter does not want daughter to worry over this, "she has enough going on".) Give verbal consent.   Addendum: 1510 CM received call from Velma (pts daughter). She is aware of observation status.   Malcolm Metro, RN 01/12/2017, 3:10 PM

## 2017-01-12 NOTE — ED Notes (Signed)
ED Provider at bedside. 

## 2017-01-12 NOTE — ED Notes (Signed)
Pt continually calling out for pain meds.

## 2017-01-12 NOTE — ED Provider Notes (Signed)
[facility] Provider Note   CSN: [id number] Arrival date & time: 01/12/17  8295     History   Chief Complaint Chief Complaint  Patient presents with  . Fall    back pain    HPI Alexandra Riley is a 83 y.o. female.  The history is provided by the patient.  She fell at home landing on her back, and hitting her head. She denies loss of consciousness. She's complaining of severe pain in her mid back, but pain extends the entire length of her spine. She did have kyphoplasty done 2 months ago. She took a dose of hydrocodone-acetaminophen at home without any relief.   Past Medical History:  Diagnosis Date  . Constipation   . Glaucoma   . High cholesterol   . Osteoporosis   . Shortness of breath on exertion     Patient Active Problem List   Diagnosis Date Noted  . Papule of skin 12/01/2016  . Back pain   . Compression fracture of L1 lumbar vertebra, closed, initial encounter (HCC) 11/20/2016  . Chronic left shoulder pain 10/03/2016  . Abdominal pain 07/05/2015  . Hematometra 06/24/2015  . Postmenopausal vaginal bleeding 06/24/2015  . Hypokalemia 11/11/2014  . Recurrent falls 11/11/2014  . Rhabdomyolysis 11/11/2014  . Hip fracture (HCC) 05/26/2014    Past Surgical History:  Procedure Laterality Date  . CATARACT EXTRACTION W/PHACO Right 01/20/2013   Procedure: CATARACT EXTRACTION PHACO AND INTRAOCULAR LENS PLACEMENT (IOC);  Surgeon: Gemma Payor, MD;  Location: [facility];  Service: Ophthalmology;  Laterality: Right;  CDE:  18.27  . CESAREAN SECTION    . EYE SURGERY    . IR KYPHO LUMBAR INC FX REDUCE BONE BX UNI/BIL CANNULATION INC/IMAGING  11/24/2016  . ORIF HIP FRACTURE Right 05/28/2014   Procedure: OPEN REDUCTION INTERNAL FIXATION HIP;  Surgeon: Darreld Mclean, MD;  Location: [facility];  Service: Orthopedics;  Laterality: Right;  . TONSILLECTOMY      OB History    Gravida Para Term Preterm AB Living   SAB TAB Ectopic Multiple Live Births                   Home Medications    Prior to Admission medications   Medication Sig Start Date End Date Taking? Authorizing Provider  aspirin EC 81 MG tablet Take 81 mg by mouth every morning.    [provider]  carisoprodol (SOMA) 350 MG tablet Take 350 mg by mouth 4 (four) times daily as needed for muscle spasms.    [provider]  clonazePAM (KLONOPIN) 0.5 MG tablet Take 1 tablet (0.5 mg total) by mouth at bedtime. Take 1 tablet by mouth once a day for anxiety for 14 days until 12/07/2016 12/08/16   Roena Malady, PA-C  dimenhyDRINATE (TRAVEL SICKNESS) 50 MG tablet Take 50 mg by mouth every 8 (eight) hours as needed.    [provider]  HYDROcodone-acetaminophen (NORCO/VICODIN) 5-325 MG tablet Take 1 tablet by mouth every 6 (six) hours as needed for moderate pain (Must last 30 days.Do not take and drive a car or use machinery.). 01/10/17   Darreld Mclean, MD  olopatadine (PATANOL) 0.1 % ophthalmic solution Place 1 drop into both eyes 2 (two) times daily.  10/11/15   [provider]  omeprazole (PRILOSEC) 20 MG capsule Take 20 mg by mouth daily.    [provider]  ondansetron (ZOFRAN) 4 MG tablet Take 4 mg by  mouth every 8 (eight) hours as needed for nausea or vomiting.  11/22/15   [provider]  polyethylene glycol (MIRALAX / GLYCOLAX) packet Take 17 g by mouth daily. 11/22/16   Marinda Elk, MD    Family History Family History  Problem Relation Age of Onset  . Colon cancer Neg Hx     Social History Social History  Substance Use Topics  . Smoking status: Never Smoker  . Smokeless tobacco: Current User    Types: Snuff  . Alcohol use No     Allergies   Patient has no known allergies.   Review of Systems Review of Systems  All other systems reviewed and are negative.    Physical Exam Updated Vital Signs BP (!) 136/96 (BP Location: Right Arm)   Pulse 64   Temp 98.2 F (36.8 C) (Oral)   Resp 18   Wt 51.7 kg (114 lb)    SpO2 96%   BMI 18.97 kg/m   Physical Exam  Nursing note and vitals reviewed.  83 year old female, resting comfortably and in no acute distress. Vital signs are significant for mild hypertension. Oxygen saturation is 96%, which is normal. Head is normocephalic and atraumatic. PERRLA, EOMI. Oropharynx is clear. Neck is nontender without adenopathy or JVD. Back has moderate tenderness in the lower thoracic region. There is no CVA tenderness. Lungs are clear without rales, wheezes, or rhonchi. Chest is nontender. Heart has regular rate and rhythm without murmur. Abdomen is soft, flat, nontender without masses or hepatosplenomegaly and peristalsis is normoactive. Extremities have no cyanosis or edema, full range of motion is present. Skin is warm and dry without rash. Neurologic: She is awake and alert but demonstrates some perseveration of speech, cranial nerves are intact, there are no motor or sensory deficits. She is moving all extremities equally.  ED Treatments / Results   Radiology No results found.  Procedures Procedures (including critical care time)  Medications Ordered in ED Medications  HYDROmorphone (DILAUDID) injection 0.5 mg (0.5 mg Intravenous Given 01/12/17 0648)  ondansetron (ZOFRAN) injection 4 mg (4 mg Intravenous Given 01/12/17 4098)     Initial Impression / Assessment and Plan / ED Course  I have reviewed the triage vital signs and the nursing notes.  Pertinent imaging results that were available during my care of the patient were reviewed by me and considered in my medical decision making (see chart for details).  Fall with injury to mid back. Old records are reviewed confirming kyphoplasty done to L1 on July 23. Because she failed to get adequate relief with hydrocodone, IV is started and she is given hydromorphone and will titrate dose to get adequate pain relief. She is sent for CT of head and cervical spine as well as plain films of thoracic and lumbar  spine. Case is signed out to Dr. Ranae Palms.  Final Clinical Impressions(s) / ED Diagnoses   Final diagnoses:  Fall at home, initial encounter  Acute mid back pain    New Prescriptions New Prescriptions   No medications on file     Dione Booze, MD 01/12/17 [phone]

## 2017-01-12 NOTE — H&P (Addendum)
History and Physical    Alexandra Riley ZOX:[id number] DOB: [birth year] DOA: 01/12/2017  Referring MD/NP/PA: Dr. Preston Fleeting  PCP: Bernerd Limbo, MD   Patient coming from: home  Chief Complaint: fall and subsequent back pain  HPI: Alexandra Riley is a 83 y.o. female with known history of osteoporosis, glaucoma, presented to [facility] emergency department with main concern of one day duration of progressively worsening lower and upper back pain. Patient reports she fell at home and landed on her back, hitting her head. Patient reports no specific prior symptoms such as chest pain or dizziness, no dyspnea, no palpitations, no visual changes. Patient reports history of recurrent falls and has had kyphoplasty done in July 2018. Patient reports that pain is now almost constant, 7/10 in severity, mostly located in the middle back area but radiating up and down the spine, from hips up to neck area. Pain occasionally radiates towards chest and abdominal area. Patient denies any specific alleviating factors. Patient denies fevers and chills, no abdominal or urinary concerns.   ED Course: In emergency department, patient hemodynamically stable but in significant pain. Vital signs stable, blood work reviewed and unremarkable. TRH asked to admit for pain control.  Review of Systems:  Constitutional: Negative for fever, chills, diaphoresis HENT: Negative for ear pain, nosebleeds, congestion, facial swelling, rhinorrhea, neck pain, neck stiffness and ear discharge.   Eyes: Negative for pain, discharge, redness, itching and visual disturbance.  Respiratory: Negative for cough, choking, chest tightness, shortness of breath, wheezing and stridor.   Cardiovascular: Negative for chest pain, palpitations and leg swelling.  Gastrointestinal: Negative for abdominal distention.  Genitourinary: Negative for dysuria, urgency, frequency, hematuria, flank pain, decreased urine volume Musculoskeletal: Positive  for upper and lower back pain, falls Neurological: Negative for dizziness, tremors, seizures, syncope, facial asymmetry, speech difficulty Hematological: Negative for adenopathy. Does not bruise/bleed easily.  Psychiatric/Behavioral: Negative for hallucinations, behavioral problems, confusion, dysphoric mood, decreased concentration and agitation.   Past Medical History:  Diagnosis Date  . Constipation   . Glaucoma   . High cholesterol   . Osteoporosis   . Shortness of breath on exertion     Past Surgical History:  Procedure Laterality Date  . CATARACT EXTRACTION W/PHACO Right 01/20/2013   Procedure: CATARACT EXTRACTION PHACO AND INTRAOCULAR LENS PLACEMENT (IOC);  Surgeon: Gemma Payor, MD;  Location: [facility];  Service: Ophthalmology;  Laterality: Right;  CDE:  18.27  . CESAREAN SECTION    . EYE SURGERY    . IR KYPHO LUMBAR INC FX REDUCE BONE BX UNI/BIL CANNULATION INC/IMAGING  11/24/2016  . ORIF HIP FRACTURE Right 05/28/2014   Procedure: OPEN REDUCTION INTERNAL FIXATION HIP;  Surgeon: Darreld Mclean, MD;  Location: [facility];  Service: Orthopedics;  Laterality: Right;  . TONSILLECTOMY     Social history:  reports that she has never smoked. Her smokeless tobacco use includes Snuff. She reports that she does not drink alcohol or use drugs.  No Known Allergies  Family History  Problem Relation Age of Onset  . Colon cancer Neg Hx     Prior to Admission medications   Medication Sig Start Date End Date Taking? Authorizing Provider  aspirin EC 81 MG tablet Take 81 mg by mouth every morning.   Yes [provider]  carisoprodol (SOMA) 350 MG tablet Take 350 mg by mouth 4 (four) times daily as needed for muscle spasms.   Yes [provider]  clonazePAM (KLONOPIN) 0.5 MG tablet Take 1 tablet (0.5 mg total)  by mouth at bedtime. Take 1 tablet by mouth once a day for anxiety for 14 days until 12/07/2016 12/08/16  Yes Lassen, Arlo C, PA-C  dimenhyDRINATE (TRAVEL SICKNESS) 50 MG tablet  Take 50 mg by mouth every 8 (eight) hours as needed.   Yes [provider]  HYDROcodone-acetaminophen (NORCO/VICODIN) 5-325 MG tablet Take 1 tablet by mouth every 6 (six) hours as needed for moderate pain (Must last 30 days.Do not take and drive a car or use machinery.). 01/10/17  Yes Darreld Mclean, MD  olopatadine (PATANOL) 0.1 % ophthalmic solution Place 1 drop into both eyes 2 (two) times daily.  10/11/15  Yes [provider]  omeprazole (PRILOSEC) 20 MG capsule Take 20 mg by mouth daily.   Yes [provider]  ondansetron (ZOFRAN) 4 MG tablet Take 4 mg by mouth every 8 (eight) hours as needed for nausea or vomiting.  11/22/15  Yes [provider]  polyethylene glycol (MIRALAX / GLYCOLAX) packet Take 17 g by mouth daily. 11/22/16  Yes Marinda Elk, MD    Physical Exam: Vitals:   01/12/17 0653 01/12/17 0800 01/12/17 1002 01/12/17 1152  BP: (!) 142/83 (!) 151/73 (!) 155/76 (!) 155/68  Pulse: 61 68 74 70  Resp: Temp:    97.9 F (36.6 C)  TempSrc:    Oral  SpO2: 99% 97% 94% 98%  Weight:        Constitutional: NAD, calm, cachectic appearing Vitals:   01/12/17 0653 01/12/17 0800 01/12/17 1002 01/12/17 1152  BP: (!) 142/83 (!) 151/73 (!) 155/76 (!) 155/68  Pulse: 61 68 74 70  Resp: Temp:    97.9 F (36.6 C)  TempSrc:    Oral  SpO2: 99% 97% 94% 98%  Weight:       Eyes: PERRL, lids and conjunctivae normal ENMT: Mucous membranes are moist. Posterior pharynx clear of any exudate or lesions. Adentulous Neck: normal, supple, no masses, no thyromegaly Respiratory:  Normal respiratory effort. No accessory muscle use.  Cardiovascular: Regular rate and rhythm, no murmurs / rubs / gallops. No extremity edema. 2+ pedal pulses. No carotid bruits.  Abdomen: no tenderness, no masses palpated. No hepatosplenomegaly. Bowel sounds positive.  Musculoskeletal: no clubbing / cyanosis. No joint deformity upper and lower extremities.  Tenderness to palpation along the length of thoracic and lumbar spine. Skin: no rashes, lesions, ulcers. No induration Neurologic: CN 2-12 grossly intact. Sensation intact, DTR normal. Psychiatric: Normal judgment and insight. Alert. Normal mood.   Labs on Admission: I have personally reviewed following labs and imaging studies  CBC:  Recent Labs Lab 01/12/17 0838  WBC 7.1  NEUTROABS 4.9  HGB 12.9  HCT 40.0  MCV 94.3  PLT 193   Basic Metabolic Panel:  Recent Labs Lab 01/12/17 0838  NA 143  K 3.6  CL 108  CO2 25  GLUCOSE 95  BUN 14  CREATININE 0.89  CALCIUM 9.6   Liver Function Tests:  Recent Labs Lab 01/12/17 0838  AST 23  ALT 17  ALKPHOS 66  BILITOT 0.6  PROT 7.3  ALBUMIN 4.3   Urine analysis:    Component Value Date/Time   COLORURINE STRAW (A) 01/12/2017 0836   APPEARANCEUR CLEAR 01/12/2017 0836   LABSPEC 1.008 01/12/2017 0836   PHURINE 5.0 01/12/2017 0836   GLUCOSEU NEGATIVE 01/12/2017 0836   HGBUR SMALL (A) 01/12/2017 0836   BILIRUBINUR NEGATIVE 01/12/2017 0836   KETONESUR NEGATIVE 01/12/2017 0836   PROTEINUR  NEGATIVE 01/12/2017 0836   UROBILINOGEN 0.2 11/11/2014 0910   NITRITE NEGATIVE 01/12/2017 0836   LEUKOCYTESUR TRACE (A) 01/12/2017 0836    Radiological Exams on Admission: Dg Thoracic Spine W/swimmers  Result Date: 01/12/2017 CLINICAL DATA:  Fall onto back this morning. Lower back pain. Initial encounter. EXAM: THORACIC SPINE - 3 VIEWS COMPARISON:  Chest x-ray 03/23/2016 FINDINGS: T4, T6, and T7 compression fractures. The upper 2 have moderate height loss; T7 advanced height loss. The T7 fracture is chronic and stable from November 2017 chest x-ray comparison. The other 2 have occurred since that time and are age-indeterminate. T11 superior endplate Schmorl's node, chronic. Exaggerated kyphosis. Thoracolumbar dextroscoliosis. IMPRESSION: 1. T4 and T6 moderate compression fractures which have occurred since 03/23/2016, age-indeterminate. 2.  Remote T7 compression fracture with advanced height loss. Electronically Signed   By: Marnee Spring M.D.   On: 01/12/2017 07:49   Dg Lumbar Spine Complete  Result Date: 01/12/2017 CLINICAL DATA:  Fall today with lumbar back pain. Initial encounter. EXAM: LUMBAR SPINE - COMPLETE 4+ VIEW COMPARISON:  Kyphoplasty fluoroscopy 11/24/2016 FINDINGS: L1 compression fracture with stable height loss compared to prior. Unchanged alignment of intraosseous cement. No acute fracture is seen. There is facet arthropathy with L2-3 and L4-5 grade 1 anterolisthesis. Degenerative disc narrowing greatest at L2-3. Lumbar dextroscoliosis. Osteopenia and atherosclerosis. IMPRESSION: 1. No acute finding. 2. Stable appearance of prior L1 compression fracture and cement augmentation. Electronically Signed   By: Marnee Spring M.D.   On: 01/12/2017 07:52   Ct Head Wo Contrast  Result Date: 01/12/2017 CLINICAL DATA:  Fall.  Head trauma EXAM: CT HEAD WITHOUT CONTRAST CT CERVICAL SPINE WITHOUT CONTRAST TECHNIQUE: Multidetector CT imaging of the head and cervical spine was performed following the standard protocol without intravenous contrast. Multiplanar CT image reconstructions of the cervical spine were also generated. COMPARISON:  10/10/2016 FINDINGS: CT HEAD FINDINGS Brain: Generalized atrophy with chronic microvascular ischemia. Negative for acute infarct, hemorrhage, mass lesion. No change from the prior study. Vascular: Negative for hyperdense vessel. Skull: Negative for fracture Sinuses/Orbits: Negative Other: None CT CERVICAL SPINE FINDINGS Alignment: Normal alignment. Accentuated thoracic kyphosis. Mild scoliosis. Skull base and vertebrae: Negative for cervical spine fracture. Mild compression fracture T4 is noted on radiographs today. Age indeterminate. Soft tissues and spinal canal: Negative for mass or adenopathy. Atherosclerotic calcification carotid bifurcation bilaterally Disc levels: No significant disc degeneration.  Mild facet degeneration on the left C3-4 and C4-5. Upper chest: Apical scarring bilaterally. Other: None IMPRESSION: No acute intracranial abnormality. Atrophy and chronic microvascular ischemic change Negative for cervical spine fracture. Mild compression fracture T4 of indeterminate age. Electronically Signed   By: Marlan Palau M.D.   On: 01/12/2017 08:07   Ct Cervical Spine Wo Contrast  Result Date: 01/12/2017 CLINICAL DATA:  Fall.  Head trauma EXAM: CT HEAD WITHOUT CONTRAST CT CERVICAL SPINE WITHOUT CONTRAST TECHNIQUE: Multidetector CT imaging of the head and cervical spine was performed following the standard protocol without intravenous contrast. Multiplanar CT image reconstructions of the cervical spine were also generated. COMPARISON:  10/10/2016 FINDINGS: CT HEAD FINDINGS Brain: Generalized atrophy with chronic microvascular ischemia. Negative for acute infarct, hemorrhage, mass lesion. No change from the prior study. Vascular: Negative for hyperdense vessel. Skull: Negative for fracture Sinuses/Orbits: Negative Other: None CT CERVICAL SPINE FINDINGS Alignment: Normal alignment. Accentuated thoracic kyphosis. Mild scoliosis. Skull base and vertebrae: Negative for cervical spine fracture. Mild compression fracture T4 is noted on radiographs today. Age indeterminate. Soft tissues and spinal canal: Negative for mass or  adenopathy. Atherosclerotic calcification carotid bifurcation bilaterally Disc levels: No significant disc degeneration. Mild facet degeneration on the left C3-4 and C4-5. Upper chest: Apical scarring bilaterally. Other: None IMPRESSION: No acute intracranial abnormality. Atrophy and chronic microvascular ischemic change Negative for cervical spine fracture. Mild compression fracture T4 of indeterminate age. Electronically Signed   By: Marlan Palau M.D.   On: 01/12/2017 08:07    EKG: Pending  Assessment/Plan Active Problems:   Pain - in lumbar and thoracic area as well as lower  cervical area - It is possible that patient has new fractures, compression fracture at T4 level is of indeterminate age - Patient however had recent kyphoplasty and despite this, patient continues to physically decline - Patient remains high risk fall with further deterioration from physical standpoint - Admission for pain control, PT and OT evaluation    DVT prophylaxis: Lovenox subcutaneous Code Status: Full code Family Communication: Pt updated at bedside Disposition Plan: To be determined Consults called: None Admission status: Observation  Debbora Presto MD [facility] Pager [phone]  If 7PM-7AM, please contact night-coverage [email or web address] Password [facility]  01/12/2017, 12:58 PM

## 2017-01-13 DIAGNOSIS — M549 Dorsalgia, unspecified: Secondary | ICD-10-CM

## 2017-01-13 LAB — TSH: TSH: 4.772 u[IU]/mL — ABNORMAL HIGH (ref 0.350–4.500)

## 2017-01-13 MED ORDER — LORAZEPAM 0.5 MG PO TABS
0.5000 mg | ORAL_TABLET | Freq: Once | ORAL | Status: AC
  Administered 2017-01-13: 0.5 mg via ORAL
  Filled 2017-01-13: qty 1

## 2017-01-13 MED ORDER — CLONAZEPAM 0.5 MG PO TABS: 0.5000 mg | ORAL_TABLET | Freq: Every day | ORAL | 0 refills | Status: AC

## 2017-01-13 MED ORDER — SENNOSIDES-DOCUSATE SODIUM 8.6-50 MG PO TABS
1.0000 | ORAL_TABLET | Freq: Two times a day (BID) | ORAL | Status: DC
  Administered 2017-01-13: 1 via ORAL
  Filled 2017-01-13: qty 1

## 2017-01-13 MED ORDER — HYDROCODONE-ACETAMINOPHEN 5-325 MG PO TABS: 1.0000 | ORAL_TABLET | Freq: Four times a day (QID) | ORAL | 0 refills | Status: AC | PRN

## 2017-01-13 NOTE — Progress Notes (Signed)
Discharge instructions and prescriptions given, verbalized understanding, out in stable condition via w/c with staff. 

## 2017-01-13 NOTE — Evaluation (Signed)
Physical Therapy Evaluation Patient Details Name: Alexandra Riley MRN: [id number] DOB: 20-Sep-1921 Today's Date: 01/13/2017   History of Present Illness  Alexandra Riley is a 83 y.o. female with known history of osteoporosis, glaucoma, presented to [facility] emergency department with main concern of one day duration of progressively worsening lower and upper back pain. Patient reports she fell at home and landed on her back, hitting her head. Patient reports no specific prior symptoms such as chest pain or dizziness, no dyspnea, no palpitations, no visual changes. Patient reports history of recurrent falls and has had kyphoplasty done in July 2018. Patient reports that pain is now almost constant, 7/10 in severity, mostly located in the middle back area but radiating up and down the spine, from hips up to neck area. Pain occasionally radiates towards chest and abdominal area. Patient denies any specific alleviating factors. Patient denies fevers and chills, no abdominal or urinary concerns.  Clinical Impression  Pt received from nursing in bed and was agreeable to PT evaluation. Pt admitted with above diagnosis and is from home where her son is currently staying with her while he is visiting and she has a Therapist, nutritional that comes 5x/week from 12-4. Pt at baseline requires assistance for dressing and bathing but per pt, she does not require assistance for household ambulation with RW. Pt currently required mod A for transfers and was supervision to min guard for amb 15ft in room with RW. Pt reported some back pain but she tolerated the session very well. Pt very slow but steady with gait. She kept hearing a man cough in the hallway and kept saying that it was her son and she needed to check on him. PT had to redirect pt's attention to the task at hand. Due to h/o falling and current deficits in mobility, recommend HHPT with 24/7 supervision for assistance once ready for d/c. Pt stated that she is already  receiving HHPT as she said that a man and a woman come to do her exercises; PT unable to determine if she was referring to her current caregiver or if she is currently receiving HHPT. Will continue to follow acutely to promote mobility, strength, balance, and overall function.    Follow Up Recommendations Home health PT;Supervision/Assistance - 24 hour    Equipment Recommendations  None recommended by PT    Recommendations for Other Services       Precautions / Restrictions Precautions Precautions: Fall Precaution Comments: might have had 2-3 falls in the last 6 months; she reports she falls backwards every time; unsure if she gets dizzy or lightheaded prior to falling. Restrictions Weight Bearing Restrictions: Yes RLE Weight Bearing: Non weight bearing LLE Weight Bearing: Non weight bearing      Mobility  Bed Mobility Overal bed mobility: Needs Assistance Bed Mobility: Rolling;Supine to Sit Rolling: Min guard   Supine to sit: Min guard;Min assist     General bed mobility comments: rolling R min guard, min guard - min A for bed mobility mainly for RLE negotiation  Transfers Overall transfer level: Needs assistance Equipment used: Rolling walker (2 wheeled) Transfers: Sit to/from Stand Sit to Stand: Mod assist;Supervision         General transfer comment: mod A for sit > stand from bed with RW, supv for stand > sit in chair with RW  Ambulation/Gait Ambulation/Gait assistance: Supervision;Min guard Ambulation Distance (Feet): 15 Feet Assistive device: Rolling walker (2 wheeled) Gait Pattern/deviations: Step-to pattern;Decreased stride length;Decreased weight shift to right;Decreased weight shift  to left;Narrow base of support;Trunk flexed Gait velocity: very slow but steady gait Gait velocity interpretation: <1.8 ft/sec, indicative of risk for recurrent falls    Stairs            Wheelchair Mobility    Modified Rankin (Stroke Patients Only)        Balance Overall balance assessment: Needs assistance Sitting-balance support: Feet supported Sitting balance-Leahy Scale: Good     Standing balance support: Bilateral upper extremity supported Standing balance-Leahy Scale: Fair                               Pertinent Vitals/Pain Pain Assessment: 0-10 Pain Score: 9  Pain Location: back Pain Descriptors / Indicators:  ("just hurts") Pain Intervention(s): Limited activity within patient's tolerance;Monitored during session;Premedicated before session;Repositioned    Home Living Family/patient expects to be discharged to:: Private residence Living Arrangements: Alone;Children (son is currently visiting and staying with pt; per pt "he's staying as long as he wants to") Available Help at Discharge: Family;Personal care attendant (sitter from [facility] comes M-F 12-4) Type of Home: House Home Access: Level entry     Home Layout: One level Home Equipment: Environmental consultant - 2 wheels;Cane - single point;Toilet riser      Prior Function Level of Independence: Needs assistance   Gait / Transfers Assistance Needed: RW for ambulation inside the house, doesn't get out in the community much; states she does not need assistance with amb with RW, does it by herself  ADL's / Homemaking Assistance Needed: her caregiver, Gunnar Fusi, helps with dressing and bathing.         Hand Dominance   Dominant Hand: Right    Extremity/Trunk Assessment   Upper Extremity Assessment Upper Extremity Assessment: Overall WFL for tasks assessed    Lower Extremity Assessment Lower Extremity Assessment: Generalized weakness    Cervical / Trunk Assessment Cervical / Trunk Assessment: Kyphotic  Communication   Communication: HOH  Cognition Arousal/Alertness: Awake/alert;Lethargic Behavior During Therapy: WFL for tasks assessed/performed Overall Cognitive Status: Within Functional Limits for tasks assessed                                  General Comments: unsure of month but knew the year, otherwise she was A&Ox4      General Comments      Exercises     Assessment/Plan    PT Assessment Patient needs continued PT services  PT Problem List Decreased strength;Decreased activity tolerance;Decreased balance;Decreased mobility;Pain       PT Treatment Interventions Gait training;Stair training;Functional mobility training;Therapeutic activities;Therapeutic exercise;Balance training;Patient/family education    PT Goals (Current goals can be found in the Care Plan section)  Acute Rehab PT Goals Patient Stated Goal: to go home PT Goal Formulation: With patient Time For Goal Achievement: 01/17/17 Potential to Achieve Goals: Good    Frequency Min 3X/week   Barriers to discharge        Co-evaluation               AM-PAC PT "6 Clicks" Daily Activity  Outcome Measure Difficulty turning over in bed (including adjusting bedclothes, sheets and blankets)?: A Little Difficulty moving from lying on back to sitting on the side of the bed? : A Little Difficulty sitting down on and standing up from a chair with arms (e.g., wheelchair, bedside commode, etc,.)?: A Little Help needed moving to and from  a bed to chair (including a wheelchair)?: A Little Help needed walking in hospital room?: A Little Help needed climbing 3-5 steps with a railing? : Total 6 Click Score: 16    End of Session Equipment Utilized During Treatment: Gait belt Activity Tolerance: Patient tolerated treatment well;No increased pain Patient left: in chair;with call bell/phone within reach;with chair alarm set Nurse Communication: Mobility status PT Visit Diagnosis: Repeated falls (R29.6);History of falling (Z91.81);Unsteadiness on feet (R26.81);Difficulty in walking, not elsewhere classified (R26.2);Muscle weakness (generalized) (M62.81)    Time: 1610-9604 PT Time Calculation (min) (ACUTE ONLY): 47 min   Charges:   PT Evaluation $PT Eval Low  Complexity: 1 Low PT Treatments $Gait Training: 8-22 mins $Therapeutic Activity: 8-22 mins   PT G Codes:   PT G-Codes **NOT FOR INPATIENT CLASS** Functional Assessment Tool Used: AM-PAC 6 Clicks Basic Mobility;Clinical judgement Functional Limitation: Mobility: Walking and moving around Mobility: Walking and Moving Around Current Status (V4098): At least 40 percent but less than 60 percent impaired, limited or restricted Mobility: Walking and Moving Around Goal Status [phone]): At least 20 percent but less than 40 percent impaired, limited or restricted      Jac Canavan PT, DPT

## 2017-01-13 NOTE — Discharge Instructions (Signed)
Back Injury Prevention Back injuries can be very painful. They can also be difficult to heal. After having one back injury, you are more likely to injure your back again. It is important to learn how to avoid injuring or re-injuring your back. The following tips can help you to prevent a back injury. What should I know about physical fitness?  Exercise for 30 minutes per day on most days of the week or as told by your doctor. Make sure to: ? Do aerobic exercises, such as walking, jogging, biking, or swimming. ? Do exercises that increase balance and strength, such as tai chi and yoga. ? Do stretching exercises. This helps with flexibility. ? Try to develop strong belly (abdominal) muscles. Your belly muscles help to support your back.  Stay at a healthy weight. This helps to decrease your risk of a back injury. What should I know about my diet?  Talk with your doctor about your overall diet. Take supplements and vitamins only as told by your doctor.  Talk with your doctor about how much calcium and vitamin D you need each day. These nutrients help to prevent weakening of the bones (osteoporosis).  Include good sources of calcium in your diet, such as: ? Dairy products. ? Green leafy vegetables. ? Products that have had calcium added to them (fortified).  Include good sources of vitamin D in your diet, such as: ? Milk. ? Foods that have had vitamin D added to them. What should I know about my posture?  Sit up straight and stand up straight. Avoid leaning forward when you sit or hunching over when you stand.  Choose chairs that have good low-back (lumbar) support.  If you work at a desk, sit close to it so you do not need to lean over. Keep your chin tucked in. Keep your neck drawn back. Keep your elbows bent so your arms look like the letter "L" (right angle).  Sit high and close to the steering wheel when you drive. Add a low-back support to your car seat, if needed.  Avoid sitting  or standing in one position for very long. Take breaks to get up, stretch, and walk around at least one time every hour. Take breaks every hour if you are driving for long periods of time.  Sleep on your side with your knees slightly bent, or sleep on your back with a pillow under your knees. Do not lie on the front of your body to sleep. What should I know about lifting, twisting, and reaching? Lifting and Heavy Lifting   Avoid heavy lifting, especially lifting over and over again. If you must do heavy lifting: ? Stretch before lifting. ? Work slowly. ? Rest between lifts. ? Use a tool such as a cart or a dolly to move objects if one is available. ? Make several small trips instead of carrying one heavy load. ? Ask for help when you need it, especially when moving big objects.  Follow these steps when lifting: ? Stand with your feet shoulder-width apart. ? Get as close to the object as you can. Do not pick up a heavy object that is far from your body. ? Use handles or lifting straps if they are available. ? Bend at your knees. Squat down, but keep your heels off the floor. ? Keep your shoulders back. Keep your chin tucked in. Keep your back straight. ? Lift the object slowly while you tighten the muscles in your legs, belly, and butt. Keep the  object as close to the center of your body as possible.  Follow these steps when putting down a heavy load: ? Stand with your feet shoulder-width apart. ? Lower the object slowly while you tighten the muscles in your legs, belly, and butt. Keep the object as close to the center of your body as possible. ? Keep your shoulders back. Keep your chin tucked in. Keep your back straight. ? Bend at your knees. Squat down, but keep your heels off the floor. ? Use handles or lifting straps if they are available. Twisting and Reaching  Avoid lifting heavy objects above your waist.  Do not twist at your waist while you are lifting or carrying a load. If  you need to turn, move your feet.  Do not bend over without bending at your knees.  Avoid reaching over your head, across a table, or for an object on a high surface. What are some other tips?  Avoid wet floors and icy ground. Keep sidewalks clear of ice to prevent falls.  Do not sleep on a mattress that is too soft or too hard.  Keep items that you use often within easy reach.  Put heavier objects on shelves at waist level, and put lighter objects on lower or higher shelves.  Find ways to lower your stress, such as: ? Exercise. ? Massage. ? Relaxation techniques.  Talk with your doctor if you feel anxious or depressed. These conditions can make back pain worse.  Wear flat heel shoes with cushioned soles.  Avoid making quick (sudden) movements.  Use both shoulder straps when carrying a backpack.  Do not use any tobacco products, including cigarettes, chewing tobacco, or electronic cigarettes. If you need help quitting, ask your doctor. This information is not intended to replace advice given to you by your health care provider. Make sure you discuss any questions you have with your health care provider. Document Released: 10/04/2007 Document Revised: 09/23/2015 Document Reviewed: 04/21/2014 Elsevier Interactive Patient Education  [employer].

## 2017-01-13 NOTE — Discharge Summary (Signed)
Physician Discharge Summary  ESSYNCE MUNSCH GNF:[id number] DOB: 19-Apr-1922 DOA: 01/12/2017  PCP: Bernerd Limbo, MD  Admit date: 01/12/2017 Discharge date: 01/13/2017  Recommendations for Outpatient Follow-up:  1. Pt will need to follow up with PCP in 2-3 weeks post discharge 2. Please obtain BMP to evaluate electrolytes and kidney function 3. Please also check CBC to evaluate Hg and Hct levels  Discharge Diagnoses:  Active Problems:   Pain  Discharge Condition: Stable  Diet recommendation: Heart healthy diet discussed in details   History of present illness:  83 y.o. female with known history of osteoporosis, glaucoma, presented to [facility] emergency department with main concern of one day duration of progressively worsening lower and upper back pain. Patient reports she fell at home and landed on her back, hitting her head. Patient reports no specific prior symptoms such as chest pain or dizziness, no dyspnea, no palpitations, no visual changes. Patient reports history of recurrent falls and has had kyphoplasty done in July 2018. Patient reports that pain is now almost constant, 7/10 in severity, mostly located in the middle back area but radiating up and down the spine, from hips up to neck area. Pain occasionally radiates towards chest and abdominal area. Patient denies any specific alleviating factors. Patient denies fevers and chills, no abdominal or urinary concerns.  Hospital Course:  Active Problems:   Pain - in lumbar and thoracic area as well as lower cervical area - It is possible that patient has new fractures, compression fracture at T4 level is of indeterminate age - Patient however had recent kyphoplasty and despite this, patient continues to physically decline - Patient remains high risk fall with further deterioration from physical standpoint - I suspect the patient will continue to experience pain until fractures heal appropriately provided that patient  does not have new episodes of fall - Physical therapy recommended, home health orders placed - Called provided number in the system for family to update, left message and provided my phone number, currently awaiting call back   Procedures/Studies: Dg Thoracic Spine W/swimmers  Result Date: 01/12/2017 CLINICAL DATA:  Fall onto back this morning. Lower back pain. Initial encounter. EXAM: THORACIC SPINE - 3 VIEWS COMPARISON:  Chest x-ray 03/23/2016 FINDINGS: T4, T6, and T7 compression fractures. The upper 2 have moderate height loss; T7 advanced height loss. The T7 fracture is chronic and stable from November 2017 chest x-ray comparison. The other 2 have occurred since that time and are age-indeterminate. T11 superior endplate Schmorl's node, chronic. Exaggerated kyphosis. Thoracolumbar dextroscoliosis. IMPRESSION: 1. T4 and T6 moderate compression fractures which have occurred since 03/23/2016, age-indeterminate. 2. Remote T7 compression fracture with advanced height loss. Electronically Signed   By: Marnee Spring M.D.   On: 01/12/2017 07:49   Dg Lumbar Spine Complete  Result Date: 01/12/2017 CLINICAL DATA:  Fall today with lumbar back pain. Initial encounter. EXAM: LUMBAR SPINE - COMPLETE 4+ VIEW COMPARISON:  Kyphoplasty fluoroscopy 11/24/2016 FINDINGS: L1 compression fracture with stable height loss compared to prior. Unchanged alignment of intraosseous cement. No acute fracture is seen. There is facet arthropathy with L2-3 and L4-5 grade 1 anterolisthesis. Degenerative disc narrowing greatest at L2-3. Lumbar dextroscoliosis. Osteopenia and atherosclerosis. IMPRESSION: 1. No acute finding. 2. Stable appearance of prior L1 compression fracture and cement augmentation. Electronically Signed   By: Marnee Spring M.D.   On: 01/12/2017 07:52   Ct Head Wo Contrast  Result Date: 01/12/2017 CLINICAL DATA:  Fall.  Head trauma EXAM: CT HEAD WITHOUT CONTRAST  CT CERVICAL SPINE WITHOUT CONTRAST TECHNIQUE:  Multidetector CT imaging of the head and cervical spine was performed following the standard protocol without intravenous contrast. Multiplanar CT image reconstructions of the cervical spine were also generated. COMPARISON:  10/10/2016 FINDINGS: CT HEAD FINDINGS Brain: Generalized atrophy with chronic microvascular ischemia. Negative for acute infarct, hemorrhage, mass lesion. No change from the prior study. Vascular: Negative for hyperdense vessel. Skull: Negative for fracture Sinuses/Orbits: Negative Other: None CT CERVICAL SPINE FINDINGS Alignment: Normal alignment. Accentuated thoracic kyphosis. Mild scoliosis. Skull base and vertebrae: Negative for cervical spine fracture. Mild compression fracture T4 is noted on radiographs today. Age indeterminate. Soft tissues and spinal canal: Negative for mass or adenopathy. Atherosclerotic calcification carotid bifurcation bilaterally Disc levels: No significant disc degeneration. Mild facet degeneration on the left C3-4 and C4-5. Upper chest: Apical scarring bilaterally. Other: None IMPRESSION: No acute intracranial abnormality. Atrophy and chronic microvascular ischemic change Negative for cervical spine fracture. Mild compression fracture T4 of indeterminate age. Electronically Signed   By: Marlan Palau M.D.   On: 01/12/2017 08:07   Ct Cervical Spine Wo Contrast  Result Date: 01/12/2017 CLINICAL DATA:  Fall.  Head trauma EXAM: CT HEAD WITHOUT CONTRAST CT CERVICAL SPINE WITHOUT CONTRAST TECHNIQUE: Multidetector CT imaging of the head and cervical spine was performed following the standard protocol without intravenous contrast. Multiplanar CT image reconstructions of the cervical spine were also generated. COMPARISON:  10/10/2016 FINDINGS: CT HEAD FINDINGS Brain: Generalized atrophy with chronic microvascular ischemia. Negative for acute infarct, hemorrhage, mass lesion. No change from the prior study. Vascular: Negative for hyperdense vessel. Skull: Negative for  fracture Sinuses/Orbits: Negative Other: None CT CERVICAL SPINE FINDINGS Alignment: Normal alignment. Accentuated thoracic kyphosis. Mild scoliosis. Skull base and vertebrae: Negative for cervical spine fracture. Mild compression fracture T4 is noted on radiographs today. Age indeterminate. Soft tissues and spinal canal: Negative for mass or adenopathy. Atherosclerotic calcification carotid bifurcation bilaterally Disc levels: No significant disc degeneration. Mild facet degeneration on the left C3-4 and C4-5. Upper chest: Apical scarring bilaterally. Other: None IMPRESSION: No acute intracranial abnormality. Atrophy and chronic microvascular ischemic change Negative for cervical spine fracture. Mild compression fracture T4 of indeterminate age. Electronically Signed   By: Marlan Palau M.D.   On: 01/12/2017 08:07     Discharge Exam: Vitals:   01/12/17 2147 01/13/17 0510  BP: (!) 142/49 (!) 138/55  Pulse: 70 66  Resp: 20 17  Temp: 98.2 F (36.8 C) 98.3 F (36.8 C)  SpO2: 96% 95%   Vitals:   01/12/17 1152 01/12/17 1500 01/12/17 2147 01/13/17 0510  BP: (!) 155/68 (!) 141/58 (!) 142/49 (!) 138/55  Pulse: 70 73 70 66  Resp: Temp: 97.9 F (36.6 C) 98 F (36.7 C) 98.2 F (36.8 C) 98.3 F (36.8 C)  TempSrc: Oral Oral  Oral  SpO2: 98% 98% 96% 95%  Weight:        General: Pt is alert, follows commands appropriately, not in acute distress Cardiovascular: Regular rate and rhythm, S1/S2 +, no murmurs, no rubs, no gallops Respiratory: Clear to auscultation bilaterally, no wheezing Abdominal: Soft, non tender, non distended, bowel sounds +, no guarding Musculoskeletal: Tenderness to palpation in lumbar and thoracic spine  Discharge Instructions   Allergies as of 01/13/2017   No Known Allergies     Medication List    TAKE these medications   aspirin EC 81 MG tablet Take 81 mg by mouth every morning.   carisoprodol 350 MG tablet Commonly  known as:  SOMA Take 350 mg by  mouth 4 (four) times daily as needed for muscle spasms.   clonazePAM 0.5 MG tablet Commonly known as:  KLONOPIN Take 1 tablet (0.5 mg total) by mouth at bedtime. Take 1 tablet by mouth once a day for anxiety for 14 days until 12/07/2016   HYDROcodone-acetaminophen 5-325 MG tablet Commonly known as:  NORCO/VICODIN Take 1 tablet by mouth every 6 (six) hours as needed for moderate pain (Must last 30 days.Do not take and drive a car or use machinery.).   olopatadine 0.1 % ophthalmic solution Commonly known as:  PATANOL Place 1 drop into both eyes 2 (two) times daily.   omeprazole 20 MG capsule Commonly known as:  PRILOSEC Take 20 mg by mouth daily.   ondansetron 4 MG tablet Commonly known as:  ZOFRAN Take 4 mg by mouth every 8 (eight) hours as needed for nausea or vomiting.   polyethylene glycol packet Commonly known as:  MIRALAX / GLYCOLAX Take 17 g by mouth daily.   TRAVEL SICKNESS 50 MG tablet Generic drug:  dimenhyDRINATE Take 50 mg by mouth every 8 (eight) hours as needed.            Discharge Care Instructions        Start     Ordered   01/13/17 0000  clonazePAM (KLONOPIN) 0.5 MG tablet  Daily at bedtime     01/13/17 1048   01/13/17 0000  HYDROcodone-acetaminophen (NORCO/VICODIN) 5-325 MG tablet  Every 6 hours PRN     01/13/17 1048     Follow-up Information    Bernerd Limbo, MD Follow up.   Specialty:  Internal Medicine Contact information: [address] [place] [address] [phone]            The results of significant diagnostics from this hospitalization (including imaging, microbiology, ancillary and laboratory) are listed below for reference.     Microbiology: No results found for this or any previous visit (from the past 240 hour(s)).   Labs: Basic Metabolic Panel:  Recent Labs Lab 01/12/17 0838  NA 143  K 3.6  CL 108  CO2 25  GLUCOSE 95  BUN 14  CREATININE 0.89  CALCIUM 9.6   Liver Function Tests:  Recent  Labs Lab 01/12/17 0838  AST 23  ALT 17  ALKPHOS 66  BILITOT 0.6  PROT 7.3  ALBUMIN 4.3   CBC:  Recent Labs Lab 01/12/17 0838  WBC 7.1  NEUTROABS 4.9  HGB 12.9  HCT 40.0  MCV 94.3  PLT 193   SIGNED: Time coordinating discharge: Over 30 minutes  Debbora Presto, MD  [facility] 01/13/2017, 10:48 AM Pager [phone]  If 7PM-7AM, please contact night-coverage [email or web address] Password [email or web address]

## 2017-01-13 NOTE — Care Management Note (Signed)
Case Management Note  Patient Details  Name: FALLAN MCCAREY MRN: [id number] Date of Birth: Jan 16, 1922  Subjective/Objective:   Received call re: pt being discharged today and is active with [facility]. Pt is in Obs status and HH services will be resumed with no further CM needs at this time.                  Action/Plan:CM will sign off for now but will be available should additional discharge needs arise or disposition change.    Expected Discharge Date:  01/13/17               Expected Discharge Plan:     In-House Referral:     Discharge planning Services  CM Consult  Post Acute Care Choice:  Resumption of Svcs/PTA Provider Choice offered to:  Patient  DME Arranged:    DME Agency:     HH Arranged:    HH Agency:     Status of Service:  Completed, signed off  If discussed at [employer], dates discussed:    Additional Comments:  Yvone Neu, RN 01/13/2017, 1:14 PM

## 2017-02-15 ENCOUNTER — Encounter (HOSPITAL_COMMUNITY): Payer: Self-pay | Admitting: Emergency Medicine

## 2017-02-15 ENCOUNTER — Emergency Department (HOSPITAL_COMMUNITY): Payer: Medicare Other

## 2017-02-15 ENCOUNTER — Observation Stay (HOSPITAL_COMMUNITY)
Admission: EM | Admit: 2017-02-15 | Discharge: 2017-02-17 | Disposition: A | Discharge status: Home / Self Care | Payer: Medicare Other | Attending: Family Medicine | Admitting: Family Medicine

## 2017-02-15 DIAGNOSIS — M419 Scoliosis, unspecified: Secondary | ICD-10-CM | POA: Diagnosis not present

## 2017-02-15 DIAGNOSIS — R531 Weakness: Secondary | ICD-10-CM | POA: Insufficient documentation

## 2017-02-15 DIAGNOSIS — B9620 Unspecified Escherichia coli [E. coli] as the cause of diseases classified elsewhere: Secondary | ICD-10-CM | POA: Diagnosis not present

## 2017-02-15 DIAGNOSIS — R778 Other specified abnormalities of plasma proteins: Secondary | ICD-10-CM

## 2017-02-15 DIAGNOSIS — F17220 Nicotine dependence, chewing tobacco, uncomplicated: Secondary | ICD-10-CM | POA: Diagnosis not present

## 2017-02-15 DIAGNOSIS — I6523 Occlusion and stenosis of bilateral carotid arteries: Secondary | ICD-10-CM | POA: Diagnosis not present

## 2017-02-15 DIAGNOSIS — Z7982 Long term (current) use of aspirin: Secondary | ICD-10-CM | POA: Insufficient documentation

## 2017-02-15 DIAGNOSIS — W19XXXA Unspecified fall, initial encounter: Secondary | ICD-10-CM | POA: Diagnosis not present

## 2017-02-15 DIAGNOSIS — Z79899 Other long term (current) drug therapy: Secondary | ICD-10-CM | POA: Diagnosis not present

## 2017-02-15 DIAGNOSIS — R296 Repeated falls: Secondary | ICD-10-CM | POA: Diagnosis not present

## 2017-02-15 DIAGNOSIS — H409 Unspecified glaucoma: Secondary | ICD-10-CM | POA: Diagnosis not present

## 2017-02-15 DIAGNOSIS — N390 Urinary tract infection, site not specified: Secondary | ICD-10-CM | POA: Diagnosis not present

## 2017-02-15 DIAGNOSIS — Z9841 Cataract extraction status, right eye: Secondary | ICD-10-CM | POA: Diagnosis not present

## 2017-02-15 DIAGNOSIS — M810 Age-related osteoporosis without current pathological fracture: Secondary | ICD-10-CM | POA: Insufficient documentation

## 2017-02-15 DIAGNOSIS — S2241XA Multiple fractures of ribs, right side, initial encounter for closed fracture: Secondary | ICD-10-CM | POA: Diagnosis not present

## 2017-02-15 DIAGNOSIS — N857 Hematometra: Secondary | ICD-10-CM | POA: Insufficient documentation

## 2017-02-15 DIAGNOSIS — Z9889 Other specified postprocedural states: Secondary | ICD-10-CM | POA: Insufficient documentation

## 2017-02-15 DIAGNOSIS — M25512 Pain in left shoulder: Secondary | ICD-10-CM | POA: Diagnosis not present

## 2017-02-15 DIAGNOSIS — G319 Degenerative disease of nervous system, unspecified: Secondary | ICD-10-CM | POA: Diagnosis not present

## 2017-02-15 DIAGNOSIS — R748 Abnormal levels of other serum enzymes: Secondary | ICD-10-CM | POA: Diagnosis not present

## 2017-02-15 DIAGNOSIS — K5900 Constipation, unspecified: Secondary | ICD-10-CM | POA: Diagnosis not present

## 2017-02-15 DIAGNOSIS — R7989 Other specified abnormal findings of blood chemistry: Secondary | ICD-10-CM

## 2017-02-15 DIAGNOSIS — G8929 Other chronic pain: Secondary | ICD-10-CM | POA: Insufficient documentation

## 2017-02-15 LAB — URINALYSIS, ROUTINE W REFLEX MICROSCOPIC
BILIRUBIN URINE: NEGATIVE
GLUCOSE, UA: NEGATIVE mg/dL
KETONES UR: NEGATIVE mg/dL
Nitrite: POSITIVE — AB
PROTEIN: 30 mg/dL — AB
Specific Gravity, Urine: 1.024 (ref 1.005–1.030)
pH: 5 (ref 5.0–8.0)

## 2017-02-15 LAB — COMPREHENSIVE METABOLIC PANEL
ALT: 15 U/L (ref 14–54)
ANION GAP: 10 (ref 5–15)
AST: 23 U/L (ref 15–41)
Albumin: 3.8 g/dL (ref 3.5–5.0)
Alkaline Phosphatase: 72 U/L (ref 38–126)
BUN: 9 mg/dL (ref 6–20)
CHLORIDE: 112 mmol/L — AB (ref 101–111)
CO2: 26 mmol/L (ref 22–32)
Calcium: 9.8 mg/dL (ref 8.9–10.3)
Creatinine, Ser: 1 mg/dL (ref 0.44–1.00)
GFR, EST AFRICAN AMERICAN: 55 mL/min — AB (ref >60)
GFR, EST NON AFRICAN AMERICAN: 47 mL/min — AB (ref >60)
Glucose, Bld: 95 mg/dL (ref 65–99)
POTASSIUM: 3.6 mmol/L (ref 3.5–5.1)
Sodium: 148 mmol/L — ABNORMAL HIGH (ref 135–145)
TOTAL PROTEIN: 6.3 g/dL — AB (ref 6.5–8.1)
Total Bilirubin: 0.5 mg/dL (ref 0.3–1.2)

## 2017-02-15 LAB — TROPONIN I
TROPONIN I: 0.06 ng/mL — AB (ref <0.03)
Troponin I: 0.06 ng/mL (ref <0.03)

## 2017-02-15 LAB — LIPASE, BLOOD: LIPASE: 37 U/L (ref 11–51)

## 2017-02-15 LAB — CBC WITH DIFFERENTIAL/PLATELET
BASOS ABS: 0 10*3/uL (ref 0.0–0.1)
BASOS PCT: 0 %
EOS ABS: 0.1 10*3/uL (ref 0.0–0.7)
Eosinophils Relative: 1 %
HEMATOCRIT: 38.6 % (ref 36.0–46.0)
HEMOGLOBIN: 12 g/dL (ref 12.0–15.0)
Lymphocytes Relative: 25 %
Lymphs Abs: 1.7 10*3/uL (ref 0.7–4.0)
MCH: 29.2 pg (ref 26.0–34.0)
MCHC: 31.1 g/dL (ref 30.0–36.0)
MCV: 93.9 fL (ref 78.0–100.0)
Monocytes Absolute: 0.7 10*3/uL (ref 0.1–1.0)
Monocytes Relative: 10 %
NEUTROS ABS: 4.2 10*3/uL (ref 1.7–7.7)
NEUTROS PCT: 64 %
Platelets: 160 10*3/uL (ref 150–400)
RBC: 4.11 MIL/uL (ref 3.87–5.11)
RDW: 14.1 % (ref 11.5–15.5)
WBC: 6.6 10*3/uL (ref 4.0–10.5)

## 2017-02-15 LAB — LACTIC ACID, PLASMA
LACTIC ACID, VENOUS: 1.2 mmol/L (ref 0.5–1.9)
LACTIC ACID, VENOUS: 1.1 mmol/L (ref 0.5–1.9)

## 2017-02-15 MED ORDER — ACETAMINOPHEN 325 MG PO TABS
650.0000 mg | ORAL_TABLET | Freq: Once | ORAL | Status: DC
Start: 2017-02-15 — End: 2017-02-15
  Filled 2017-02-15: qty 2

## 2017-02-15 MED ORDER — ONDANSETRON HCL 4 MG/2ML IJ SOLN
4.0000 mg | Freq: Four times a day (QID) | INTRAMUSCULAR | Status: DC | PRN
  Administered 2017-02-16: 4 mg via INTRAVENOUS
  Filled 2017-02-15: qty 2

## 2017-02-15 MED ORDER — OXYCODONE-ACETAMINOPHEN 5-325 MG PO TABS: 1.0000 | ORAL_TABLET | Freq: Once | ORAL | Status: DC

## 2017-02-15 MED ORDER — DEXTROSE 5 % IV SOLN
1.0000 g | Freq: Once | INTRAVENOUS | Status: AC
  Administered 2017-02-15: 1 g via INTRAVENOUS
  Filled 2017-02-15: qty 10

## 2017-02-15 MED ORDER — CARISOPRODOL 350 MG PO TABS
350.0000 mg | ORAL_TABLET | Freq: Four times a day (QID) | ORAL | Status: DC | PRN
  Administered 2017-02-16: 350 mg via ORAL
  Filled 2017-02-15: qty 1

## 2017-02-15 MED ORDER — ENOXAPARIN SODIUM 30 MG/0.3ML ~~LOC~~ SOLN
30.0000 mg | SUBCUTANEOUS | Status: DC
  Administered 2017-02-16 (×2): 30 mg via SUBCUTANEOUS
  Filled 2017-02-15 (×2): qty 0.3

## 2017-02-15 MED ORDER — MORPHINE SULFATE (PF) 2 MG/ML IV SOLN
2.0000 mg | INTRAVENOUS | Status: DC | PRN
Start: 2017-02-15 — End: 2017-02-15
  Administered 2017-02-15: 2 mg via INTRAVENOUS
  Filled 2017-02-15: qty 1

## 2017-02-15 MED ORDER — SODIUM CHLORIDE 0.45 % IV SOLN
INTRAVENOUS | Status: DC
  Administered 2017-02-16: via INTRAVENOUS

## 2017-02-15 MED ORDER — ASPIRIN EC 81 MG PO TBEC
81.0000 mg | DELAYED_RELEASE_TABLET | Freq: Every morning | ORAL | Status: DC
  Administered 2017-02-16 – 2017-02-17 (×2): 81 mg via ORAL
  Filled 2017-02-15 (×2): qty 1

## 2017-02-15 MED ORDER — ONDANSETRON HCL 4 MG PO TABS: 4.0000 mg | ORAL_TABLET | Freq: Four times a day (QID) | ORAL | Status: DC | PRN

## 2017-02-15 MED ORDER — CLONAZEPAM 0.5 MG PO TABS
0.5000 mg | ORAL_TABLET | Freq: Every day | ORAL | Status: DC
  Administered 2017-02-15 – 2017-02-16 (×2): 0.5 mg via ORAL
  Filled 2017-02-15 (×2): qty 1

## 2017-02-15 MED ORDER — HYDROCODONE-ACETAMINOPHEN 5-325 MG PO TABS
1.0000 | ORAL_TABLET | Freq: Four times a day (QID) | ORAL | Status: DC | PRN
  Administered 2017-02-16 – 2017-02-17 (×5): 1 via ORAL
  Filled 2017-02-15 (×5): qty 1

## 2017-02-15 MED ORDER — POLYETHYLENE GLYCOL 3350 17 G PO PACK
17.0000 g | PACK | Freq: Every day | ORAL | Status: DC
  Administered 2017-02-16 – 2017-02-17 (×2): 17 g via ORAL
  Filled 2017-02-15 (×2): qty 1

## 2017-02-15 NOTE — ED Notes (Signed)
Pt requesting her bedtime meds; AC called and waiting on meds

## 2017-02-15 NOTE — H&P (Signed)
TRH H&P    Patient Demographics:    Alexandra Riley, is a 83 y.o. female  MRN: [id number]  DOB - 1921-05-24  Admit Date - 02/15/2017  Referring MD/NP/PA: Dr. Clarene Duke  Outpatient Primary MD for the patient is Eden Emms, Hali Marry, MD  Patient coming from: home  Chief Complaint  Patient presents with  . Weakness      HPI:    Alexandra Riley  is a 83 y.o. female, with history of hyperlipidemia, osteoporosis came to hospital with gradual onset of worsening generalized weakness. Patient says that she has poor by mouth intake with decreased appetite and recurrent falls. Patient says that she fell last night and complains of right-sided chest pain. Rib x-rays shows acute mildly displaced right sixth and seventh rib fractures.  Patient also found to have abnormal UA.With positive nitrite.  Patient denies dysuria, urgency or frequency of urination. She complains of right-sided chest pain, no shortness of breath. Complains of nausea but no vomiting. No diarrhea. Patient complains of constipation  In the ED patient was given ceftriaxone for UTI.    Review of systems:     All other systems reviewed and are negative.   With Past History of the following :    Past Medical History:  Diagnosis Date  . Constipation   . Glaucoma   . High cholesterol   . Osteoporosis   . Shortness of breath on exertion       Past Surgical History:  Procedure Laterality Date  . CATARACT EXTRACTION W/PHACO Right 01/20/2013   Procedure: CATARACT EXTRACTION PHACO AND INTRAOCULAR LENS PLACEMENT (IOC);  Surgeon: Gemma Payor, MD;  Location: [facility];  Service: Ophthalmology;  Laterality: Right;  CDE:  18.27  . CESAREAN SECTION    . EYE SURGERY    . IR KYPHO LUMBAR INC FX REDUCE BONE BX UNI/BIL CANNULATION INC/IMAGING  11/24/2016  . ORIF HIP FRACTURE Right 05/28/2014   Procedure: OPEN REDUCTION INTERNAL FIXATION HIP;  Surgeon:  Darreld Mclean, MD;  Location: [facility];  Service: Orthopedics;  Laterality: Right;  . TONSILLECTOMY        Social History:      Social History  Substance Use Topics  . Smoking status: Never Smoker  . Smokeless tobacco: Current User    Types: Snuff  . Alcohol use No       Family History :     Family History  Problem Relation Age of Onset  . Colon cancer Neg Hx       Home Medications:   Prior to Admission medications   Medication Sig Start Date End Date Taking? Authorizing Provider  aspirin EC 81 MG tablet Take 81 mg by mouth every morning.   Yes [provider]  carisoprodol (SOMA) 350 MG tablet Take 350 mg by mouth 4 (four) times daily as needed for muscle spasms.   Yes [provider]  clonazePAM (KLONOPIN) 0.5 MG tablet Take 1 tablet (0.5 mg total) by mouth at bedtime. Take 1 tablet by mouth once a day for anxiety for 14 days until 12/07/2016  01/13/17  Yes Dorothea Ogle, MD  dimenhyDRINATE (TRAVEL SICKNESS) 50 MG tablet Take 50 mg by mouth every 8 (eight) hours as needed.   Yes [provider]  HYDROcodone-acetaminophen (NORCO/VICODIN) 5-325 MG tablet Take 1 tablet by mouth every 6 (six) hours as needed for moderate pain (Must last 30 days.Do not take and drive a car or use machinery.). 01/13/17  Yes Dorothea Ogle, MD  ondansetron (ZOFRAN) 4 MG tablet Take 4 mg by mouth every 8 (eight) hours as needed for nausea or vomiting.  11/22/15  Yes [provider]  polyethylene glycol (MIRALAX / GLYCOLAX) packet Take 17 g by mouth daily. Patient not taking: Reported on 02/15/2017 11/22/16   Marinda Elk, MD     Allergies:    No Known Allergies   Physical Exam:   Vitals  Blood pressure (!) 129/59, pulse 71, temperature (!) 97.1 F (36.2 C), temperature source Oral, resp. rate 19, height 5\' 5"  (1.651 m), weight 51.7 kg (114 lb), SpO2 98 %.  1.  General: Appears in no acute distress  2. Psychiatric:  Intact judgement and  insight,  awake alert, oriented x 3.  3. Neurologic: No focal neurological deficits, all cranial nerves intact.Strength 5/5 all 4 extremities, sensation intact all 4 extremities, plantars down going.  4. Eyes :  anicteric sclerae, moist conjunctivae with no lid lag. PERRLA.  5. ENMT:  Oropharynx clear with moist mucous membranes and good dentition  6. Neck:  supple, no cervical lymphadenopathy appriciated, No thyromegaly  7. Respiratory : Normal respiratory effort, good air movement bilaterally,clear to  auscultation bilaterally  8. Cardiovascular : RRR, no gallops, rubs or murmurs, no leg edema  9. Gastrointestinal:  Positive bowel sounds, abdomen soft, non-tender to palpation,no hepatosplenomegaly, no rigidity or guarding       10. Skin:  No cyanosis, normal texture and turgor, no rash, lesions or ulcers  11.Musculoskeletal:  Good muscle tone,  joints appear normal , no effusions,  normal range of motion    Data Review:    CBC  Recent Labs Lab 02/15/17 1731  WBC 6.6  HGB 12.0  HCT 38.6  PLT 160  MCV 93.9  MCH 29.2  MCHC 31.1  RDW 14.1  LYMPHSABS 1.7  MONOABS 0.7  EOSABS 0.1  BASOSABS 0.0   ------------------------------------------------------------------------------------------------------------------  Chemistries   Recent Labs Lab 02/15/17 1731  NA 148*  K 3.6  CL 112*  CO2 26  GLUCOSE 95  BUN 9  CREATININE 1.00  CALCIUM 9.8  AST 23  ALT 15  ALKPHOS 72  BILITOT 0.5   ------------------------------------------------------------------------------------------------------------------  ------------------------------------------------------------------------------------------------------------------ GFR: Estimated Creatinine Clearance: 29.3 mL/min (by C-G formula based on SCr of 1 mg/dL). Liver Function Tests:  Recent Labs Lab 02/15/17 1731  AST 23  ALT 15  ALKPHOS 72  BILITOT 0.5  PROT 6.3*  ALBUMIN 3.8    Recent Labs Lab 02/15/17 1731    LIPASE 37   No results for input(s): AMMONIA in the last 168 hours. Coagulation Profile: No results for input(s): INR, PROTIME in the last 168 hours. Cardiac Enzymes:  Recent Labs Lab 02/15/17 1731  TROPONINI 0.06*   BNP (last 3 results) No results for input(s): PROBNP in the last 8760 hours. HbA1C: No results for input(s): HGBA1C in the last 72 hours. CBG: No results for input(s): GLUCAP in the last 168 hours. Lipid Profile: No results for input(s): CHOL, HDL, LDLCALC, TRIG, CHOLHDL, LDLDIRECT in the last 72 hours. Thyroid Function Tests: No results for input(s):  TSH, T4TOTAL, FREET4, T3FREE, THYROIDAB in the last 72 hours. Anemia Panel: No results for input(s): VITAMINB12, FOLATE, FERRITIN, TIBC, IRON, RETICCTPCT in the last 72 hours.  --------------------------------------------------------------------------------------------------------------- Urine analysis:    Component Value Date/Time   COLORURINE YELLOW 02/15/2017 1718   APPEARANCEUR HAZY (A) 02/15/2017 1718   LABSPEC 1.024 02/15/2017 1718   PHURINE 5.0 02/15/2017 1718   GLUCOSEU NEGATIVE 02/15/2017 1718   HGBUR SMALL (A) 02/15/2017 1718   BILIRUBINUR NEGATIVE 02/15/2017 1718   KETONESUR NEGATIVE 02/15/2017 1718   PROTEINUR 30 (A) 02/15/2017 1718   UROBILINOGEN 0.2 11/11/2014 0910   NITRITE POSITIVE (A) 02/15/2017 1718   LEUKOCYTESUR LARGE (A) 02/15/2017 1718      Imaging Results:    Dg Ribs Unilateral W/chest Right  Result Date: 02/15/2017 CLINICAL DATA:  Fall with rib pain EXAM: RIGHT RIBS AND CHEST - 3+ VIEW COMPARISON:  01/12/2017, 03/23/2016 FINDINGS: Single-view chest demonstrates no acute consolidation or pleural effusion. Stable cardiomediastinal silhouette with atherosclerosis. No pneumothorax. Right rib series demonstrates acute mildly displaced right sixth and seventh rib fractures. IMPRESSION: 1. Negative for pneumothorax or pleural effusion 2. Acute mildly displaced right sixth and seventh rib  fractures. Electronically Signed   By: Jasmine PangKim  Fujinaga M.D.   On: 02/15/2017 19:30   Dg Lumbar Spine Complete  Result Date: 02/15/2017 CLINICAL DATA:  Fall with low back pain EXAM: LUMBAR SPINE - COMPLETE 4+ VIEW COMPARISON:  01/13/2007 FINDINGS: Scoliosis of the spine. Treated compression deformity at L1. Remaining vertebral body heights are maintained. Mild degenerative changes at L1-L2, L2-L3 and L3-L4. Aortic vascular calcification. Calcified phleboliths in the pelvis. IMPRESSION: 1. No acute osseous abnormality. 2. Scoliosis with treated compression deformity at L1. Electronically Signed   By: Jasmine PangKim  Fujinaga M.D.   On: 02/15/2017 19:27   Ct Head Wo Contrast  Result Date: 02/15/2017 CLINICAL DATA:  Increasing weakness. Patient has fallen 3 times over the past 3 days. EXAM: CT HEAD WITHOUT CONTRAST CT CERVICAL SPINE WITHOUT CONTRAST TECHNIQUE: Multidetector CT imaging of the head and cervical spine was performed following the standard protocol without intravenous contrast. Multiplanar CT image reconstructions of the cervical spine were also generated. COMPARISON:  01/12/2017 FINDINGS: CT HEAD FINDINGS BRAIN: There is sulcal and ventricular prominence consistent with superficial and central atrophy. No intraparenchymal hemorrhage, mass effect nor midline shift. Periventricular and subcortical white matter hypodensities consistent with chronic small vessel ischemic disease are identified. No acute large vascular territory infarcts. No abnormal extra-axial fluid collections. Basal cisterns are not effaced and midline. VASCULAR: Mild to moderate calcific atherosclerosis of the carotid siphons. No hyperdense vessels. SKULL: No skull fracture. No significant scalp soft tissue swelling. SINUSES/ORBITS: The mastoid air-cells are clear. The included paranasal sinuses are well-aerated.The included ocular globes and orbital contents are non-suspicious. Right cataract extraction. OTHER: None. CT CERVICAL SPINE  FINDINGS Alignment: Intact craniocervical relationship and atlantodental interval. Maintained cervical lordosis. Skull base and vertebrae: No acute cervical spine fracture. Mild T4 height loss, stable since previous. Soft tissues and spinal canal: Atherosclerotic calcifications of the extracranial carotid arteries at the bifurcations. No adenopathy. No prevertebral fluid or swelling. No visible canal hematoma. Disc levels: Facet arthropathy the left at C3-4 and C4-5. Significant degenerative disc disease or focal disc herniation. Upper chest: Apical scarring noted bilaterally. Other: None IMPRESSION: 1. Cerebral atrophy with chronic small vessel ischemia. No acute intracranial abnormality. 2. No acute cervical spine fracture or posttraumatic subluxation. Chronic stable mild compression of T4. Electronically Signed   By: Tollie Ethavid  Kwon M.D.   On:  02/15/2017 19:41   Ct Cervical Spine Wo Contrast  Result Date: 02/15/2017 CLINICAL DATA:  Increasing weakness. Patient has fallen 3 times over the past 3 days. EXAM: CT HEAD WITHOUT CONTRAST CT CERVICAL SPINE WITHOUT CONTRAST TECHNIQUE: Multidetector CT imaging of the head and cervical spine was performed following the standard protocol without intravenous contrast. Multiplanar CT image reconstructions of the cervical spine were also generated. COMPARISON:  01/12/2017 FINDINGS: CT HEAD FINDINGS BRAIN: There is sulcal and ventricular prominence consistent with superficial and central atrophy. No intraparenchymal hemorrhage, mass effect nor midline shift. Periventricular and subcortical white matter hypodensities consistent with chronic small vessel ischemic disease are identified. No acute large vascular territory infarcts. No abnormal extra-axial fluid collections. Basal cisterns are not effaced and midline. VASCULAR: Mild to moderate calcific atherosclerosis of the carotid siphons. No hyperdense vessels. SKULL: No skull fracture. No significant scalp soft tissue swelling.  SINUSES/ORBITS: The mastoid air-cells are clear. The included paranasal sinuses are well-aerated.The included ocular globes and orbital contents are non-suspicious. Right cataract extraction. OTHER: None. CT CERVICAL SPINE FINDINGS Alignment: Intact craniocervical relationship and atlantodental interval. Maintained cervical lordosis. Skull base and vertebrae: No acute cervical spine fracture. Mild T4 height loss, stable since previous. Soft tissues and spinal canal: Atherosclerotic calcifications of the extracranial carotid arteries at the bifurcations. No adenopathy. No prevertebral fluid or swelling. No visible canal hematoma. Disc levels: Facet arthropathy the left at C3-4 and C4-5. Significant degenerative disc disease or focal disc herniation. Upper chest: Apical scarring noted bilaterally. Other: None IMPRESSION: 1. Cerebral atrophy with chronic small vessel ischemia. No acute intracranial abnormality. 2. No acute cervical spine fracture or posttraumatic subluxation. Chronic stable mild compression of T4. Electronically Signed   By: Tollie Eth M.D.   On: 02/15/2017 19:41    My personal review of EKG: Rhythm NSR, nonspecific T-wave abnormalities in lateral leads   Assessment & Plan:    Active Problems:   UTI (urinary tract infection)   1. UTI        Start Rocephin         Follow urine culture results  2. Right sixth and seventh rib fractures        Vicodin when necessary for pain         Soma when necessary for muscle relaxation  3. Elevated troponin        Unclear etiology, EKG shows nonspecific T-wave abnormalities        Cycle troponin every 6 hours 3  4.  Recurrent falls       Consider PT/ OT evaluation once patient's pain is controlled.  5    Constipation        Continue daily MiraLAX.     DVT Prophylaxis-   Lovenox   AM Labs Ordered, also please review Full Orders  Family Communication: Admission, patients condition and plan of care including tests being ordered have  been discussed with the patient * who indicate understanding and agree with the plan and Code Status.  Code Status:   Full code  Admission status: observation    Time spent in minutes : 55 minutes   Majour Frei S M.D on 02/15/2017 at 10:25 PM  Between 7am to 7pm - Pager - [phone]. After 7pm go to [email or web address] - password [facility]  [facility] - Office  [phone]

## 2017-02-15 NOTE — ED Notes (Signed)
Daughter called and left number [phone] and stated if she needed to be contacted to call her; daughter states she is unable to come up here at this time

## 2017-02-15 NOTE — ED Notes (Signed)
Report given to Bree RN on 300 

## 2017-02-15 NOTE — ED Notes (Signed)
Pt says she is unable to stand at this time.

## 2017-02-15 NOTE — ED Triage Notes (Signed)
Pt lives at home with son and daughter in law. Per EMS-family reports patient has been increasingly weak and has fallen 3 times over the past 3 days. Pt is alert and oriented x 4, reports she has felt weak and has no appetite. Reports she has fallen several times, denies hitting head or loc. nad noted.

## 2017-02-15 NOTE — ED Provider Notes (Signed)
[facility] EMERGENCY DEPARTMENT Provider Note   CSN: [id number] Arrival date & time: 02/15/17  1658     History   Chief Complaint Chief Complaint  Patient presents with  . Weakness    HPI THY GULLIKSON is a 83 y.o. female.  HPI  Pt was seen at 1710. Per EMS, pt's family, and pt report: c/o gradual onset and worsening of persistent generalized weakness for the past week. Has been associated with decreased appetite and multiple falls. Pt states she "doesn't feel like eating." Pt c/o right sided LBP and ribs pain after she fell 3 times over the past 3 days. Denies abd pain, no N/V/D, no neck pain, no headache, no CP/SOB, no cough, no fevers, no focal motor weakness, no tingling/numbness in extremities, no syncope/LOC, no AMS.   Past Medical History:  Diagnosis Date  . Constipation   . Glaucoma   . High cholesterol   . Osteoporosis   . Shortness of breath on exertion     Patient Active Problem List   Diagnosis Date Noted  . Acute mid back pain   . Pain 01/12/2017  . Papule of skin 12/01/2016  . Back pain   . Compression fracture of L1 lumbar vertebra, closed, initial encounter (HCC) 11/20/2016  . Chronic left shoulder pain 10/03/2016  . Abdominal pain 07/05/2015  . Hematometra 06/24/2015  . Postmenopausal vaginal bleeding 06/24/2015  . Hypokalemia 11/11/2014  . Recurrent falls 11/11/2014  . Rhabdomyolysis 11/11/2014  . Hip fracture (HCC) 05/26/2014    Past Surgical History:  Procedure Laterality Date  . CATARACT EXTRACTION W/PHACO Right 01/20/2013   Procedure: CATARACT EXTRACTION PHACO AND INTRAOCULAR LENS PLACEMENT (IOC);  Surgeon: Gemma Payor, MD;  Location: [facility];  Service: Ophthalmology;  Laterality: Right;  CDE:  18.27  . CESAREAN SECTION    . EYE SURGERY    . IR KYPHO LUMBAR INC FX REDUCE BONE BX UNI/BIL CANNULATION INC/IMAGING  11/24/2016  . ORIF HIP FRACTURE Right 05/28/2014   Procedure: OPEN REDUCTION INTERNAL FIXATION HIP;  Surgeon: Darreld Mclean, MD;   Location: [facility];  Service: Orthopedics;  Laterality: Right;  . TONSILLECTOMY      OB History    Gravida Para Term Preterm AB Living   4 3 3     3    SAB TAB Ectopic Multiple Live Births                   Home Medications    Prior to Admission medications   Medication Sig Start Date End Date Taking? Authorizing Provider  aspirin EC 81 MG tablet Take 81 mg by mouth every morning.   Yes [provider]  carisoprodol (SOMA) 350 MG tablet Take 350 mg by mouth 4 (four) times daily as needed for muscle spasms.   Yes [provider]  clonazePAM (KLONOPIN) 0.5 MG tablet Take 1 tablet (0.5 mg total) by mouth at bedtime. Take 1 tablet by mouth once a day for anxiety for 14 days until 12/07/2016 01/13/17   Dorothea Ogle, MD  dimenhyDRINATE (TRAVEL SICKNESS) 50 MG tablet Take 50 mg by mouth every 8 (eight) hours as needed.    [provider]  HYDROcodone-acetaminophen (NORCO/VICODIN) 5-325 MG tablet Take 1 tablet by mouth every 6 (six) hours as needed for moderate pain (Must last 30 days.Do not take and drive a car or use machinery.). 01/13/17   Dorothea Ogle, MD  olopatadine (PATANOL) 0.1 % ophthalmic solution Place 1 drop into both eyes 2 (two)  times daily.  10/11/15   [provider]  omeprazole (PRILOSEC) 20 MG capsule Take 20 mg by mouth daily.    [provider]  ondansetron (ZOFRAN) 4 MG tablet Take 4 mg by mouth every 8 (eight) hours as needed for nausea or vomiting.  11/22/15   [provider]  polyethylene glycol (MIRALAX / GLYCOLAX) packet Take 17 g by mouth daily. 11/22/16   Marinda Elk, MD    Family History Family History  Problem Relation Age of Onset  . Colon cancer Neg Hx     Social History Social History  Substance Use Topics  . Smoking status: Never Smoker  . Smokeless tobacco: Current User    Types: Snuff  . Alcohol use No     Allergies   Patient has no known allergies.   Review of Systems Review of  Systems ROS: Statement: All systems negative except as marked or noted in the HPI; Constitutional: Negative for fever and chills. +generalized weakness/fatigue.; ; Eyes: Negative for eye pain, redness and discharge. ; ; ENMT: Negative for ear pain, hoarseness, nasal congestion, sinus pressure and sore throat. ; ; Cardiovascular: Negative for chest pain, palpitations, diaphoresis, dyspnea and peripheral edema. ; ; Respiratory: Negative for cough, wheezing and stridor. ; ; Gastrointestinal: +poor PO intake. Negative for nausea, vomiting, diarrhea, abdominal pain, blood in stool, hematemesis, jaundice and rectal bleeding. . ; ; Genitourinary: Negative for dysuria, flank pain and hematuria. ; ; Musculoskeletal: +LBP, right ribs pain. Negative for neck pain. Negative for swelling and deformity.; ; Skin: Negative for pruritus, rash, abrasions, blisters, bruising and skin lesion.; ; Neuro: Negative for headache, lightheadedness and neck stiffness. Negative for weakness, altered level of consciousness, altered mental status, extremity weakness, paresthesias, involuntary movement, seizure and syncope.       Physical Exam Updated Vital Signs BP 108/75   Pulse 70   Temp (!) 97.1 F (36.2 C) (Oral)   Resp 16   Ht 5\' 5"  (1.651 m)   Wt 51.7 kg (114 lb)   SpO2 99%   BMI 18.97 kg/m    18:13 Orthostatic Vital Signs VB  Orthostatic Lying   BP- Lying: 115/57  Pulse- Lying: 67      Orthostatic Sitting  BP- Sitting: 126/58  Pulse- Sitting: 69      18:18:34 ED Notes VB  Pt says she is unable to stand at this time.    Physical Exam 1715: Physical examination:  Nursing notes reviewed; Vital signs and O2 SAT reviewed;  Constitutional: Thin, frail. Well hydrated, In no acute distress; Head:  Normocephalic, atraumatic; Eyes: EOMI, PERRL, No scleral icterus; ENMT: Mouth and pharynx normal, Mucous membranes moist; Neck: Supple, Full range of motion, No lymphadenopathy; Cardiovascular: Regular rate and rhythm,  No gallop; Respiratory: Breath sounds clear & equal bilaterally, No wheezes.  Speaking full sentences with ease, Normal respiratory effort/excursion; Chest: +right lateral ribs tender to palp. No rash, no soft tissue crepitus, no deformity, no ecchymosis, no abrasions. Movement normal; Abdomen: Soft, Nontender, Nondistended, Normal bowel sounds; Genitourinary: No CVA tenderness; Spine:  No midline CS, TS, LS tenderness. +TTP right lumbar paraspinal muscles. No rash, no ecchymosis, no abrasions.;; Extremities: Pulses normal, Pelvis stable. NT bilat UE's and LE's without deformity. Pt is able to raise bilat extended LE's off stretcher without pain.  No edema, No calf edema or asymmetry.; Neuro: AA&Ox3, Major CN grossly intact. No facial droop. Speech clear. No gross focal motor or sensory deficits in extremities.; Skin: Color normal, Warm, Dry.  ED Treatments / Results  Labs (all labs ordered are listed, but only abnormal results are displayed)   EKG  EKG Interpretation  Date/Time:  Thursday February 15 2017 17:49:29 EDT Ventricular Rate:  69 PR Interval:    QRS Duration: 126 QT Interval:  417 QTC Calculation: 447 R Axis:   54 Text Interpretation:  Sinus rhythm Right bundle branch block Nonspecific T abnormalities, lateral leads Nonspecific T wave abnormality Anterior leads Baseline wander When compared with ECG of 03/23/2016 No significant change was found Confirmed by Samuel JesterMcManus, Izaia Say [id number]) on 02/15/2017 5:58:30 PM       Radiology   Procedures Procedures (including critical care time)  Medications Ordered in ED Medications - No data to display   Initial Impression / Assessment and Plan / ED Course  I have reviewed the triage vital signs and the nursing notes.  Pertinent labs & imaging results that were available during my care of the patient were reviewed by me and considered in my medical decision making (see chart for details).  MDM Reviewed: previous chart, nursing note  and vitals Reviewed previous: labs and ECG Interpretation: labs, ECG, x-ray and CT scan   Results for orders placed or performed during the hospital encounter of 02/15/17  Urinalysis, Routine w reflex microscopic  Result Value Ref Range   Color, Urine YELLOW YELLOW   APPearance HAZY (A) CLEAR   Specific Gravity, Urine 1.024 1.005 - 1.030   pH 5.0 5.0 - 8.0   Glucose, UA NEGATIVE NEGATIVE mg/dL   Hgb urine dipstick SMALL (A) NEGATIVE   Bilirubin Urine NEGATIVE NEGATIVE   Ketones, ur NEGATIVE NEGATIVE mg/dL   Protein, ur 30 (A) NEGATIVE mg/dL   Nitrite POSITIVE (A) NEGATIVE   Leukocytes, UA LARGE (A) NEGATIVE   RBC / HPF 6-30 0 - 5 RBC/hpf   WBC, UA TOO NUMEROUS TO COUNT 0 - 5 WBC/hpf   Bacteria, UA FEW (A) NONE SEEN   Squamous Epithelial / LPF 0-5 (A) NONE SEEN   Mucus PRESENT    Budding Yeast PRESENT    Hyaline Casts, UA PRESENT    Non Squamous Epithelial 0-5 (A) NONE SEEN  Comprehensive metabolic panel  Result Value Ref Range   Sodium 148 (H) 135 - 145 mmol/L   Potassium 3.6 3.5 - 5.1 mmol/L   Chloride 112 (H) 101 - 111 mmol/L   CO2 26 22 - 32 mmol/L   Glucose, Bld 95 65 - 99 mg/dL   BUN 9 6 - 20 mg/dL   Creatinine, Ser 1.911.00 0.44 - 1.00 mg/dL   Calcium 9.8 8.9 - 47.810.3 mg/dL   Total Protein 6.3 (L) 6.5 - 8.1 g/dL   Albumin 3.8 3.5 - 5.0 g/dL   AST 23 15 - 41 U/L   ALT 15 14 - 54 U/L   Alkaline Phosphatase 72 38 - 126 U/L   Total Bilirubin 0.5 0.3 - 1.2 mg/dL   GFR calc non Af Amer 47 (L) >60 mL/min   GFR calc Af Amer 55 (L) >60 mL/min   Anion gap 10 5 - 15  Lipase, blood  Result Value Ref Range   Lipase 37 11 - 51 U/L  Troponin I  Result Value Ref Range   Troponin I 0.06 (HH) <0.03 ng/mL  CBC with Differential  Result Value Ref Range   WBC 6.6 4.0 - 10.5 K/uL   RBC 4.11 3.87 - 5.11 MIL/uL   Hemoglobin 12.0 12.0 - 15.0 g/dL   HCT 29.538.6 62.136.0 - 30.846.0 %  MCV 93.9 78.0 - 100.0 fL   MCH 29.2 26.0 - 34.0 pg   MCHC 31.1 30.0 - 36.0 g/dL   RDW 09.8 11.9 - 14.7 %    Platelets 160 150 - 400 K/uL   Neutrophils Relative % 64 %   Neutro Abs 4.2 1.7 - 7.7 K/uL   Lymphocytes Relative 25 %   Lymphs Abs 1.7 0.7 - 4.0 K/uL   Monocytes Relative 10 %   Monocytes Absolute 0.7 0.1 - 1.0 K/uL   Eosinophils Relative 1 %   Eosinophils Absolute 0.1 0.0 - 0.7 K/uL   Basophils Relative 0 %   Basophils Absolute 0.0 0.0 - 0.1 K/uL  Lactic acid, plasma  Result Value Ref Range   Lactic Acid, Venous 1.2 0.5 - 1.9 mmol/L   Dg Ribs Unilateral W/chest Right Result Date: 02/15/2017 CLINICAL DATA:  Fall with rib pain EXAM: RIGHT RIBS AND CHEST - 3+ VIEW COMPARISON:  01/12/2017, 03/23/2016 FINDINGS: Single-view chest demonstrates no acute consolidation or pleural effusion. Stable cardiomediastinal silhouette with atherosclerosis. No pneumothorax. Right rib series demonstrates acute mildly displaced right sixth and seventh rib fractures. IMPRESSION: 1. Negative for pneumothorax or pleural effusion 2. Acute mildly displaced right sixth and seventh rib fractures. Electronically Signed   By: Jasmine Pang M.D.   On: 02/15/2017 19:30   Dg Lumbar Spine Complete Result Date: 02/15/2017 CLINICAL DATA:  Fall with low back pain EXAM: LUMBAR SPINE - COMPLETE 4+ VIEW COMPARISON:  01/13/2007 FINDINGS: Scoliosis of the spine. Treated compression deformity at L1. Remaining vertebral body heights are maintained. Mild degenerative changes at L1-L2, L2-L3 and L3-L4. Aortic vascular calcification. Calcified phleboliths in the pelvis. IMPRESSION: 1. No acute osseous abnormality. 2. Scoliosis with treated compression deformity at L1. Electronically Signed   By: Jasmine Pang M.D.   On: 02/15/2017 19:27   Ct Head Wo Contrast Result Date: 02/15/2017 CLINICAL DATA:  Increasing weakness. Patient has fallen 3 times over the past 3 days. EXAM: CT HEAD WITHOUT CONTRAST CT CERVICAL SPINE WITHOUT CONTRAST TECHNIQUE: Multidetector CT imaging of the head and cervical spine was performed following the standard  protocol without intravenous contrast. Multiplanar CT image reconstructions of the cervical spine were also generated. COMPARISON:  01/12/2017 FINDINGS: CT HEAD FINDINGS BRAIN: There is sulcal and ventricular prominence consistent with superficial and central atrophy. No intraparenchymal hemorrhage, mass effect nor midline shift. Periventricular and subcortical white matter hypodensities consistent with chronic small vessel ischemic disease are identified. No acute large vascular territory infarcts. No abnormal extra-axial fluid collections. Basal cisterns are not effaced and midline. VASCULAR: Mild to moderate calcific atherosclerosis of the carotid siphons. No hyperdense vessels. SKULL: No skull fracture. No significant scalp soft tissue swelling. SINUSES/ORBITS: The mastoid air-cells are clear. The included paranasal sinuses are well-aerated.The included ocular globes and orbital contents are non-suspicious. Right cataract extraction. OTHER: None. CT CERVICAL SPINE FINDINGS Alignment: Intact craniocervical relationship and atlantodental interval. Maintained cervical lordosis. Skull base and vertebrae: No acute cervical spine fracture. Mild T4 height loss, stable since previous. Soft tissues and spinal canal: Atherosclerotic calcifications of the extracranial carotid arteries at the bifurcations. No adenopathy. No prevertebral fluid or swelling. No visible canal hematoma. Disc levels: Facet arthropathy the left at C3-4 and C4-5. Significant degenerative disc disease or focal disc herniation. Upper chest: Apical scarring noted bilaterally. Other: None IMPRESSION: 1. Cerebral atrophy with chronic small vessel ischemia. No acute intracranial abnormality. 2. No acute cervical spine fracture or posttraumatic subluxation. Chronic stable mild compression of T4. Electronically Signed  By: Tollie Eth M.D.   On: 02/15/2017 19:41   Ct Cervical Spine Wo Contrast Result Date: 02/15/2017 CLINICAL DATA:  Increasing  weakness. Patient has fallen 3 times over the past 3 days. EXAM: CT HEAD WITHOUT CONTRAST CT CERVICAL SPINE WITHOUT CONTRAST TECHNIQUE: Multidetector CT imaging of the head and cervical spine was performed following the standard protocol without intravenous contrast. Multiplanar CT image reconstructions of the cervical spine were also generated. COMPARISON:  01/12/2017 FINDINGS: CT HEAD FINDINGS BRAIN: There is sulcal and ventricular prominence consistent with superficial and central atrophy. No intraparenchymal hemorrhage, mass effect nor midline shift. Periventricular and subcortical white matter hypodensities consistent with chronic small vessel ischemic disease are identified. No acute large vascular territory infarcts. No abnormal extra-axial fluid collections. Basal cisterns are not effaced and midline. VASCULAR: Mild to moderate calcific atherosclerosis of the carotid siphons. No hyperdense vessels. SKULL: No skull fracture. No significant scalp soft tissue swelling. SINUSES/ORBITS: The mastoid air-cells are clear. The included paranasal sinuses are well-aerated.The included ocular globes and orbital contents are non-suspicious. Right cataract extraction. OTHER: None. CT CERVICAL SPINE FINDINGS Alignment: Intact craniocervical relationship and atlantodental interval. Maintained cervical lordosis. Skull base and vertebrae: No acute cervical spine fracture. Mild T4 height loss, stable since previous. Soft tissues and spinal canal: Atherosclerotic calcifications of the extracranial carotid arteries at the bifurcations. No adenopathy. No prevertebral fluid or swelling. No visible canal hematoma. Disc levels: Facet arthropathy the left at C3-4 and C4-5. Significant degenerative disc disease or focal disc herniation. Upper chest: Apical scarring noted bilaterally. Other: None IMPRESSION: 1. Cerebral atrophy with chronic small vessel ischemia. No acute intracranial abnormality. 2. No acute cervical spine fracture or  posttraumatic subluxation. Chronic stable mild compression of T4. Electronically Signed   By: Tollie Eth M.D.   On: 02/15/2017 19:41    2030:  IV morphine given for right rib fx pain. +UTI, UC pending; IV rocephin given. Pt unable to stand for orthostatic VS due to generalized weakness and pain.  Dx and testing d/w pt and family.  Questions answered.  Verb understanding, agreeable to admit. T/C to [facility]. Sharl Ma, case discussed, including:  HPI, pertinent PM/SHx, VS/PE, dx testing, ED course and treatment:  Agreeable to admit.     Final Clinical Impressions(s) / ED Diagnoses   Final diagnoses:  None    New Prescriptions New Prescriptions   No medications on file     Samuel Jester, DO 02/17/17 1738

## 2017-02-16 DIAGNOSIS — R296 Repeated falls: Secondary | ICD-10-CM | POA: Diagnosis not present

## 2017-02-16 DIAGNOSIS — R748 Abnormal levels of other serum enzymes: Secondary | ICD-10-CM

## 2017-02-16 DIAGNOSIS — S2241XA Multiple fractures of ribs, right side, initial encounter for closed fracture: Secondary | ICD-10-CM | POA: Diagnosis not present

## 2017-02-16 DIAGNOSIS — R531 Weakness: Secondary | ICD-10-CM | POA: Diagnosis not present

## 2017-02-16 DIAGNOSIS — N390 Urinary tract infection, site not specified: Secondary | ICD-10-CM

## 2017-02-16 LAB — CBC
HEMATOCRIT: 36 % (ref 36.0–46.0)
Hemoglobin: 11.3 g/dL — ABNORMAL LOW (ref 12.0–15.0)
MCH: 29.3 pg (ref 26.0–34.0)
MCHC: 31.4 g/dL (ref 30.0–36.0)
MCV: 93.3 fL (ref 78.0–100.0)
Platelets: 150 10*3/uL (ref 150–400)
RBC: 3.86 MIL/uL — AB (ref 3.87–5.11)
RDW: 14.1 % (ref 11.5–15.5)
WBC: 9.8 10*3/uL (ref 4.0–10.5)

## 2017-02-16 LAB — COMPREHENSIVE METABOLIC PANEL
ALBUMIN: 3.3 g/dL — AB (ref 3.5–5.0)
ALT: 12 U/L — AB (ref 14–54)
AST: 17 U/L (ref 15–41)
Alkaline Phosphatase: 65 U/L (ref 38–126)
Anion gap: 7 (ref 5–15)
BILIRUBIN TOTAL: 0.4 mg/dL (ref 0.3–1.2)
BUN: 8 mg/dL (ref 6–20)
CHLORIDE: 112 mmol/L — AB (ref 101–111)
CO2: 23 mmol/L (ref 22–32)
Calcium: 8.8 mg/dL — ABNORMAL LOW (ref 8.9–10.3)
Creatinine, Ser: 0.69 mg/dL (ref 0.44–1.00)
GFR calc Af Amer: >60 mL/min (ref >60)
GFR calc non Af Amer: >60 mL/min (ref >60)
GLUCOSE: 95 mg/dL (ref 65–99)
Potassium: 3.4 mmol/L — ABNORMAL LOW (ref 3.5–5.1)
Sodium: 142 mmol/L (ref 135–145)
TOTAL PROTEIN: 5.6 g/dL — AB (ref 6.5–8.1)

## 2017-02-16 LAB — TROPONIN I
Troponin I: 0.06 ng/mL (ref <0.03)
Troponin I: 0.06 ng/mL (ref <0.03)

## 2017-02-16 MED ORDER — ADULT MULTIVITAMIN W/MINERALS CH
1.0000 | ORAL_TABLET | Freq: Every day | ORAL | Status: DC
  Administered 2017-02-16 – 2017-02-17 (×2): 1 via ORAL
  Filled 2017-02-16 (×2): qty 1

## 2017-02-16 MED ORDER — CARISOPRODOL 350 MG PO TABS
350.0000 mg | ORAL_TABLET | Freq: Once | ORAL | Status: AC
  Administered 2017-02-16: 350 mg via ORAL
  Filled 2017-02-16: qty 1

## 2017-02-16 MED ORDER — CLONAZEPAM 0.5 MG PO TABS
0.2500 mg | ORAL_TABLET | Freq: Once | ORAL | Status: AC
  Administered 2017-02-16: 0.25 mg via ORAL
  Filled 2017-02-16: qty 1

## 2017-02-16 MED ORDER — ENSURE ENLIVE PO LIQD
237.0000 mL | Freq: Two times a day (BID) | ORAL | Status: DC
  Administered 2017-02-16 – 2017-02-17 (×2): 237 mL via ORAL

## 2017-02-16 MED ORDER — DEXTROSE 5 % IV SOLN
1.0000 g | INTRAVENOUS | Status: DC
  Administered 2017-02-16: 1 g via INTRAVENOUS
  Filled 2017-02-16 (×3): qty 10

## 2017-02-16 NOTE — Care Management CC44 (Signed)
Condition Code 44 Documentation Completed  Patient Details  Name: Alexandra OvermanRuth W Riley MRN: [id number] Date of Birth: 1921-09-11   Condition Code 44 given:  Yes Patient signature on Condition Code 44 notice:  Yes Documentation of 2 MD's agreement:  Yes Code 44 added to claim:  Yes    Lydiana Milley, Chrystine OilerSharley Diane, RN 02/16/2017, 1:49 PM

## 2017-02-16 NOTE — Care Management Obs Status (Signed)
MEDICARE OBSERVATION STATUS NOTIFICATION   Patient Details  Name: Alexandra Riley MRN: [id number] Date of Birth: [birth year]   Medicare Observation Status Notification Given:  Yes    Murrell Elizondo, Chrystine OilerSharley Diane, RN 02/16/2017, 1:49 PM

## 2017-02-16 NOTE — Progress Notes (Signed)
Pharmacy Antibiotic Note  Alexandra OvermanRuth W Riley is a 83 y.o. female admitted on 02/15/2017 with UTI.  Pharmacy has been consulted for ROCEPHIN dosing.  Plan: Rocephin 1gm IV q24hrs Monitor labs, progress, c/s  Height: 5\' 5"  (165.1 cm) Weight: 110 lb 7.2 oz (50.1 kg) IBW/kg (Calculated) : 57  Temp (24hrs), Avg:97.7 F (36.5 C), Min:97.1 F (36.2 C), Max:98 F (36.7 C)   Recent Labs Lab 02/15/17 1731 02/15/17 2016 02/16/17 0436  WBC 6.6  --  9.8  CREATININE 1.00  --  0.69  LATICACIDVEN 1.2 1.1  --     Estimated Creatinine Clearance: 35.5 mL/min (by C-G formula based on SCr of 0.69 mg/dL).    No Known Allergies  Antimicrobials this admission: Rocephin 10/19 >>   Dose adjustments this admission:  Microbiology results:  UCx: pending   Thank you for allowing pharmacy to be a part of this patient's care.  Valrie HartHall, Danique Hartsough A 02/16/2017 11:23 AM

## 2017-02-16 NOTE — Care Management Note (Signed)
Case Management Note  Patient Details  Name: Alexandra Riley MRN: [id number] Date of Birth: 1922/04/03  Subjective/Objective: Adm with UTI, fall. From home, has caregiver 12-4 daily. Has been active with [facility], just finished PT services. [facility] SW is working with family. Alexandra Riley, daughter reports she has an appt. On Monday with [facility] to enroll patient hopefully.                    Action/Plan: Anticipate DC home with care of family. Alexandra Riley reports she does not want any new HH orders at DC. No CM needs communicated.   Expected Discharge Date:   02/17/2017               Expected Discharge Plan:  Home/Self Care  In-House Referral:     Discharge planning Services  CM Consult  Post Acute Care Choice:  Home Health, Resumption of Svcs/PTA Provider Choice offered to:  Adult Children, Patient  DME Arranged:    DME Agency:     HH Arranged:    HH Agency:     Status of Service:  Completed, signed off  If discussed at Long Length of Stay Meetings, dates discussed:    Additional Comments:  Alexandra Riley, Alexandra OilerSharley Diane, RN 02/16/2017, 2:03 PM

## 2017-02-16 NOTE — Progress Notes (Signed)
Initial Nutrition Assessment  DOCUMENTATION CODES:      INTERVENTION:  Ensure Enlive po BID, each supplement provides 350 kcal and 20 grams of protein   Add Multivitamin daily due to limited po intake pattern at home  Downgrade to Mechanical soft diet due to poor dentition   NUTRITION DIAGNOSIS:   Inadequate oral intake related to poor appetite (reports no taste for food ) as evidenced by meal completion < 50%, moderate depletions of muscle mass.    GOAL:   Patient will meet greater than or equal to 90% of their needs (if feasible given her advanced age)  MONITOR:   PO intake, Supplement acceptance, Weight trends, Labs  REASON FOR ASSESSMENT:   Malnutrition Screening Tool    ASSESSMENT:   Alexandra Riley is a pleasant 83 yo who presents with c/o generalized weakness and decreased appetite.  Her home diet is regular. Her daughter and son in law buy groceries for her and she has an aid that comes in 5 x week from 12-4 an assist with food prepration. Alexandra Riley says she usually has her prepare and egg, sausage and toast for her and that is her main food intake for the day. She snacks on jello. Her primary beverage is coffee with cream and sugar. Complains that she has NO taste for food ( no desire to eat). She has poor dentition and receiving a regular diet here. It may be easier for her to eat a soft diet. She has a small pancake this morning and a couple of bites of grits and sausage for breakfast (25-50%) meal. Able to feed herself.  The patient weight hx is fairly stable between 114-118 lbs over the past year. She has moderate muscle loss dorsal and deltoids mild orbital fat loss. NO edema. Suspect she is chronically undernourished based on her reported daily intake habits and moderate muscle loss.  Recent Labs Lab 02/15/17 1731 02/16/17 0436  NA 148* 142  K 3.6 3.4*  CL 112* 112*  CO2 26 23  BUN 9 8  CREATININE 1.00 0.69  CALCIUM 9.8 8.8*  GLUCOSE 95 95   Labs and  meds reviewed  Diet Order:  Diet regular Room service appropriate? Yes; Fluid consistency: Thin  Skin:  Reviewed, no issues  Last BM:  unknown  Height:   Ht Readings from Last 1 Encounters:  02/16/17 5\' 5"  (1.651 m)    Weight:   Wt Readings from Last 1 Encounters:  02/16/17 117 lb 1 oz (53.1 kg)    Ideal Body Weight:  57 kg  BMI:  Body mass index is 19.48 kg/m.  Estimated Nutritional Needs:   Kcal:  1500-1643 (28-31 kcal/kg)  Protein:  63-74 gr (1.2-1.4 gr/kg)  Fluid:  >1300 ml daily  EDUCATION NEEDS: addressed   Alexandra ShiversLynn Ruhee Enck Alexandra,RD,CSG,LDN Office: [phone] Pager: [phone]

## 2017-02-16 NOTE — Progress Notes (Signed)
PROGRESS NOTE    Alexandra Riley  ZOX:[id number] DOB: 1921-05-20 DOA: 02/15/2017 PCP: Bernerd Limbo, MD    Brief Narrative:   Alexandra Riley  is a 83 y.o. female, with history of hyperlipidemia, osteoporosis came to hospital with gradual onset of worsening generalized weakness. Patient says that she has poor by mouth intake with decreased appetite and recurrent falls. Patient says that she fell last night and complains of right-sided chest pain. Rib x-rays shows acute mildly displaced right sixth and seventh rib fractures. Patient also found to have abnormal UA.With positive nitrite.  Patient denies dysuria, urgency or frequency of urination. She complains of right-sided chest pain, no shortness of breath. Complains of nausea but no vomiting. No diarrhea. Patient complains of constipation  In the ED patient was given ceftriaxone for UTI.   Assessment & Plan:   Active Problems:   UTI (urinary tract infection)  UTI - continue rocephin - f/u culture results   Right sixth and seventh rib fractures - continue vicodin for pain - continue home soma for pain   Elevated troponin - Unclear etiology - plateaued - EKG shows nonspecific T-wave abnormalities - no chest pain complaints  Recurrent falls -PT/ OT evaluation once patient's pain is controlled. -  Constipation - Continue daily MiraLAX.   DVT prophylaxis: lovenox Code Status: Full Code Family Communication: no family bedside Disposition Plan: likely home when improved   Consultants:   None  Procedures:   None  Antimicrobials:   Rocephin    Subjective: Patient sitting up in bed.  She did not know why she was in the hospital.  She says she feels nauseous and hasn't had a bowel movement.  She denies any burning with urination.  Objective: Vitals:   02/15/17 2300 02/16/17 0000 02/16/17 0543 02/16/17 1148  BP: 140/61 134/81 (!) 116/49   Pulse:  77 67   Resp: 20 20 20    Temp:  98 F  (36.7 C) 97.9 F (36.6 C)   TempSrc:  Oral Oral   SpO2:   98%   Weight:  50.1 kg (110 lb 7.2 oz)  53.1 kg (117 lb 1 oz)  Height:  5\' 5"  (1.651 m)      Intake/Output Summary (Last 24 hours) at 02/16/17 1549 Last data filed at 02/16/17 0400  Gross per 24 hour  Intake              315 ml  Output                0 ml  Net              315 ml   Filed Weights   02/15/17 1703 02/16/17 0000 02/16/17 1148  Weight: 51.7 kg (114 lb) 50.1 kg (110 lb 7.2 oz) 53.1 kg (117 lb 1 oz)    Examination:  General exam: Appears calm and comfortable, elderly female Respiratory system: Clear to auscultation. Respiratory effort normal. Cardiovascular system: S1 & S2 heard, RRR. No JVD, murmurs, rubs, gallops or clicks. No pedal edema. Gastrointestinal system: Abdomen is nondistended, soft and minimally tender in the left lower quadrant. No organomegaly or masses felt. Normal bowel sounds heard. Central nervous system:  No focal neurological deficits. Extremities: Symmetric 5 x 5 power. Skin: No rashes, lesions or ulcers Psychiatry: Mood & affect appropriate.     Data Reviewed: I have personally reviewed following labs and imaging studies  CBC:  Recent Labs Lab 02/15/17 1731 02/16/17 0436  WBC 6.6 9.8  NEUTROABS 4.2  --  HGB 12.0 11.3*  HCT 38.6 36.0  MCV 93.9 93.3  PLT 160 150   Basic Metabolic Panel:  Recent Labs Lab 02/15/17 1731 02/16/17 0436  NA 148* 142  K 3.6 3.4*  CL 112* 112*  CO2 26 23  GLUCOSE 95 95  BUN 9 8  CREATININE 1.00 0.69  CALCIUM 9.8 8.8*   GFR: Estimated Creatinine Clearance: 37.6 mL/min (by C-G formula based on SCr of 0.69 mg/dL). Liver Function Tests:  Recent Labs Lab 02/15/17 1731 02/16/17 0436  AST 23 17  ALT 15 12*  ALKPHOS 72 65  BILITOT 0.5 0.4  PROT 6.3* 5.6*  ALBUMIN 3.8 3.3*    Recent Labs Lab 02/15/17 1731  LIPASE 37   No results for input(s): AMMONIA in the last 168 hours. Coagulation Profile: No results for input(s): INR,  PROTIME in the last 168 hours. Cardiac Enzymes:  Recent Labs Lab 02/15/17 1731 02/15/17 2030 02/16/17 0436 02/16/17 1053  TROPONINI 0.06* 0.06* 0.06* 0.06*   BNP (last 3 results) No results for input(s): PROBNP in the last 8760 hours. HbA1C: No results for input(s): HGBA1C in the last 72 hours. CBG: No results for input(s): GLUCAP in the last 168 hours. Lipid Profile: No results for input(s): CHOL, HDL, LDLCALC, TRIG, CHOLHDL, LDLDIRECT in the last 72 hours. Thyroid Function Tests: No results for input(s): TSH, T4TOTAL, FREET4, T3FREE, THYROIDAB in the last 72 hours. Anemia Panel: No results for input(s): VITAMINB12, FOLATE, FERRITIN, TIBC, IRON, RETICCTPCT in the last 72 hours. Sepsis Labs:  Recent Labs Lab 02/15/17 1731 02/15/17 2016  LATICACIDVEN 1.2 1.1    No results found for this or any previous visit (from the past 240 hour(s)).       Radiology Studies: Dg Ribs Unilateral W/chest Right  Result Date: 02/15/2017 CLINICAL DATA:  Fall with rib pain EXAM: RIGHT RIBS AND CHEST - 3+ VIEW COMPARISON:  01/12/2017, 03/23/2016 FINDINGS: Single-view chest demonstrates no acute consolidation or pleural effusion. Stable cardiomediastinal silhouette with atherosclerosis. No pneumothorax. Right rib series demonstrates acute mildly displaced right sixth and seventh rib fractures. IMPRESSION: 1. Negative for pneumothorax or pleural effusion 2. Acute mildly displaced right sixth and seventh rib fractures. Electronically Signed   By: Jasmine PangKim  Fujinaga M.D.   On: 02/15/2017 19:30   Dg Lumbar Spine Complete  Result Date: 02/15/2017 CLINICAL DATA:  Fall with low back pain EXAM: LUMBAR SPINE - COMPLETE 4+ VIEW COMPARISON:  01/13/2007 FINDINGS: Scoliosis of the spine. Treated compression deformity at L1. Remaining vertebral body heights are maintained. Mild degenerative changes at L1-L2, L2-L3 and L3-L4. Aortic vascular calcification. Calcified phleboliths in the pelvis. IMPRESSION: 1. No  acute osseous abnormality. 2. Scoliosis with treated compression deformity at L1. Electronically Signed   By: Jasmine PangKim  Fujinaga M.D.   On: 02/15/2017 19:27   Ct Head Wo Contrast  Result Date: 02/15/2017 CLINICAL DATA:  Increasing weakness. Patient has fallen 3 times over the past 3 days. EXAM: CT HEAD WITHOUT CONTRAST CT CERVICAL SPINE WITHOUT CONTRAST TECHNIQUE: Multidetector CT imaging of the head and cervical spine was performed following the standard protocol without intravenous contrast. Multiplanar CT image reconstructions of the cervical spine were also generated. COMPARISON:  01/12/2017 FINDINGS: CT HEAD FINDINGS BRAIN: There is sulcal and ventricular prominence consistent with superficial and central atrophy. No intraparenchymal hemorrhage, mass effect nor midline shift. Periventricular and subcortical white matter hypodensities consistent with chronic small vessel ischemic disease are identified. No acute large vascular territory infarcts. No abnormal extra-axial fluid collections. Basal cisterns are not effaced  and midline. VASCULAR: Mild to moderate calcific atherosclerosis of the carotid siphons. No hyperdense vessels. SKULL: No skull fracture. No significant scalp soft tissue swelling. SINUSES/ORBITS: The mastoid air-cells are clear. The included paranasal sinuses are well-aerated.The included ocular globes and orbital contents are non-suspicious. Right cataract extraction. OTHER: None. CT CERVICAL SPINE FINDINGS Alignment: Intact craniocervical relationship and atlantodental interval. Maintained cervical lordosis. Skull base and vertebrae: No acute cervical spine fracture. Mild T4 height loss, stable since previous. Soft tissues and spinal canal: Atherosclerotic calcifications of the extracranial carotid arteries at the bifurcations. No adenopathy. No prevertebral fluid or swelling. No visible canal hematoma. Disc levels: Facet arthropathy the left at C3-4 and C4-5. Significant degenerative disc  disease or focal disc herniation. Upper chest: Apical scarring noted bilaterally. Other: None IMPRESSION: 1. Cerebral atrophy with chronic small vessel ischemia. No acute intracranial abnormality. 2. No acute cervical spine fracture or posttraumatic subluxation. Chronic stable mild compression of T4. Electronically Signed   By: Tollie Eth M.D.   On: 02/15/2017 19:41   Ct Cervical Spine Wo Contrast  Result Date: 02/15/2017 CLINICAL DATA:  Increasing weakness. Patient has fallen 3 times over the past 3 days. EXAM: CT HEAD WITHOUT CONTRAST CT CERVICAL SPINE WITHOUT CONTRAST TECHNIQUE: Multidetector CT imaging of the head and cervical spine was performed following the standard protocol without intravenous contrast. Multiplanar CT image reconstructions of the cervical spine were also generated. COMPARISON:  01/12/2017 FINDINGS: CT HEAD FINDINGS BRAIN: There is sulcal and ventricular prominence consistent with superficial and central atrophy. No intraparenchymal hemorrhage, mass effect nor midline shift. Periventricular and subcortical white matter hypodensities consistent with chronic small vessel ischemic disease are identified. No acute large vascular territory infarcts. No abnormal extra-axial fluid collections. Basal cisterns are not effaced and midline. VASCULAR: Mild to moderate calcific atherosclerosis of the carotid siphons. No hyperdense vessels. SKULL: No skull fracture. No significant scalp soft tissue swelling. SINUSES/ORBITS: The mastoid air-cells are clear. The included paranasal sinuses are well-aerated.The included ocular globes and orbital contents are non-suspicious. Right cataract extraction. OTHER: None. CT CERVICAL SPINE FINDINGS Alignment: Intact craniocervical relationship and atlantodental interval. Maintained cervical lordosis. Skull base and vertebrae: No acute cervical spine fracture. Mild T4 height loss, stable since previous. Soft tissues and spinal canal: Atherosclerotic calcifications  of the extracranial carotid arteries at the bifurcations. No adenopathy. No prevertebral fluid or swelling. No visible canal hematoma. Disc levels: Facet arthropathy the left at C3-4 and C4-5. Significant degenerative disc disease or focal disc herniation. Upper chest: Apical scarring noted bilaterally. Other: None IMPRESSION: 1. Cerebral atrophy with chronic small vessel ischemia. No acute intracranial abnormality. 2. No acute cervical spine fracture or posttraumatic subluxation. Chronic stable mild compression of T4. Electronically Signed   By: Tollie Eth M.D.   On: 02/15/2017 19:41        Scheduled Meds: . aspirin EC  81 mg Oral q morning - 10a  . clonazePAM  0.5 mg Oral QHS  . enoxaparin (LOVENOX) injection  30 mg Subcutaneous Q24H  . feeding supplement (ENSURE ENLIVE)  237 mL Oral BID BM  . multivitamin with minerals  1 tablet Oral Daily  . polyethylene glycol  17 g Oral Daily   Continuous Infusions: . cefTRIAXone (ROCEPHIN)  IV       LOS: 1 day    Time spent: 30 minutes    Katrinka Blazing, MD [facility] Pager [phone]  If 7PM-7AM, please contact night-coverage [email or web address] Password [email or web address] 02/16/2017, 3:49 PM

## 2017-02-17 DIAGNOSIS — R296 Repeated falls: Secondary | ICD-10-CM | POA: Diagnosis not present

## 2017-02-17 DIAGNOSIS — S2241XA Multiple fractures of ribs, right side, initial encounter for closed fracture: Secondary | ICD-10-CM | POA: Diagnosis not present

## 2017-02-17 DIAGNOSIS — R748 Abnormal levels of other serum enzymes: Secondary | ICD-10-CM | POA: Diagnosis not present

## 2017-02-17 DIAGNOSIS — R531 Weakness: Secondary | ICD-10-CM | POA: Diagnosis not present

## 2017-02-17 MED ORDER — CEPHALEXIN 500 MG PO CAPS
500.0000 mg | ORAL_CAPSULE | Freq: Two times a day (BID) | ORAL | Status: DC
  Administered 2017-02-17: 500 mg via ORAL
  Filled 2017-02-17: qty 1

## 2017-02-17 MED ORDER — CARISOPRODOL 350 MG PO TABS
350.0000 mg | ORAL_TABLET | Freq: Three times a day (TID) | ORAL | Status: DC | PRN
  Administered 2017-02-17: 350 mg via ORAL
  Filled 2017-02-17: qty 1

## 2017-02-17 MED ORDER — CEPHALEXIN 500 MG PO CAPS: 500.0000 mg | ORAL_CAPSULE | Freq: Two times a day (BID) | ORAL | 0 refills | Status: AC

## 2017-02-17 NOTE — Evaluation (Signed)
Physical Therapy Evaluation Patient Details Name: Alexandra Riley MRN: [id number] DOB: [birth year] Today's Date: 02/17/2017   History of Present Illness  Pollyanna Levay  is a 83 y.o. female, with history of hyperlipidemia, osteoporosis came to hospital with gradual onset of worsening generalized weakness. Patient says that she has poor by mouth intake with decreased appetite and recurrent falls. Patient says that she fell last night and complains of right-sided chest pain. Rib x-rays shows acute mildly displaced right sixth and seventh rib fractures.  Clinical Impression  Pt received lying in bed and was agreeable to PT evaluation. Pt from home with her son, daughter-in-law, and she has a caretaker that comes daily. Pt reported that she fell and always falls backwards because of her heels and that she has to walk on her toes. PLOF pt amb with RW HH distances, required assistance for ADLs and IADLs. Pt currently required min guard - assist for bed mobility and min guard during transfers and ambulation with RW. Pt gait speed very slow and demo'd gait deviations which required cues to correct. Assessed pt's static standing balance with no UE support and pt almost immediately had a weight shift posteriorly and she corrected by grabbing the RW. Recommend HHPT with 24/7 supervision to address pt's balance, gait, and strength deficits in order to improve overall balance and decrease risk of falls. Will continue to follow acutely to address pt's deficits.     Follow Up Recommendations Home health PT;Supervision/Assistance - 24 hour    Equipment Recommendations  None recommended by PT    Recommendations for Other Services       Precautions / Restrictions Precautions Precautions: Fall Precaution Comments: mulitple falls in last 3 days; weakness; very slow gait speed Restrictions Weight Bearing Restrictions: No      Mobility  Bed Mobility Overal bed mobility: Needs Assistance Bed Mobility: Supine  to Sit;Sit to Supine     Supine to sit: Min guard Sit to supine: Min assist   General bed mobility comments: min A for LE sit > supine  Transfers Overall transfer level: Needs assistance Equipment used: Rolling walker (2 wheeled) Transfers: Sit to/from Stand Sit to Stand: Min guard         General transfer comment: increased time and required a couple of attempts to complete task and   Ambulation/Gait Ambulation/Gait assistance: Min guard Ambulation Distance (Feet): 50 Feet Assistive device: Rolling walker (2 wheeled) Gait Pattern/deviations: Step-to pattern;Decreased stride length;Narrow base of support;Trunk flexed Gait velocity: very slow gait Gait velocity interpretation: <1.8 ft/sec, indicative of risk for recurrent falls General Gait Details: cues to increase step length; intermittently got feet outside RW BOS but corrected independently  Stairs            Wheelchair Mobility    Modified Rankin (Stroke Patients Only)       Balance Overall balance assessment: Needs assistance Sitting-balance support: Feet supported Sitting balance-Leahy Scale: Good     Standing balance support: Bilateral upper extremity supported;No upper extremity supported Standing balance-Leahy Scale: Poor Standing balance comment: pt with almost immediate posterior lean when static standing without UE support                             Pertinent Vitals/Pain Pain Assessment: Faces Faces Pain Scale: Hurts even more Pain Location: R chest, back, hurts when she breathes Pain Descriptors / Indicators: Sore Pain Intervention(s): Limited activity within patient's tolerance;Monitored during session;Repositioned    Home Living  Family/patient expects to be discharged to:: Private residence Living Arrangements: Children Available Help at Discharge: Family;Personal care attendant (per chart review, caretakers 12-4 daily) Type of Home: House Home Access: Level entry      Home Layout: One level Home Equipment: Cane - single point;Toilet riser;Walker - 4 wheels      Prior Function Level of Independence: Needs assistance   Gait / Transfers Assistance Needed: She stated that she ambulated with a RW; ambulating household distances and used RW when going to doctors offices, otherwise did not get out in the community  ADL's / Homemaking Assistance Needed: Pt states that she needed help with bathing but could dress herself.        Hand Dominance   Dominant Hand: Right    Extremity/Trunk Assessment   Upper Extremity Assessment Upper Extremity Assessment: Overall WFL for tasks assessed    Lower Extremity Assessment Lower Extremity Assessment: Generalized weakness    Cervical / Trunk Assessment Cervical / Trunk Assessment: Kyphotic  Communication   Communication: HOH  Cognition Arousal/Alertness: Awake/alert Behavior During Therapy: WFL for tasks assessed/performed Overall Cognitive Status: Within Functional Limits for tasks assessed                                        General Comments      Exercises     Assessment/Plan    PT Assessment Patient needs continued PT services  PT Problem List Decreased strength;Decreased activity tolerance;Decreased balance;Decreased mobility;Pain       PT Treatment Interventions Gait training;Functional mobility training;Therapeutic activities;Therapeutic exercise;Balance training    PT Goals (Current goals can be found in the Care Plan section)  Acute Rehab PT Goals Patient Stated Goal: to go home PT Goal Formulation: With patient Time For Goal Achievement: 02/23/17 Potential to Achieve Goals: Good    Frequency Min 2X/week   Barriers to discharge        Co-evaluation               AM-PAC PT "6 Clicks" Daily Activity  Outcome Measure Difficulty turning over in bed (including adjusting bedclothes, sheets and blankets)?: None Difficulty moving from lying on back to  sitting on the side of the bed? : A Little Difficulty sitting down on and standing up from a chair with arms (e.g., wheelchair, bedside commode, etc,.)?: A Little Help needed moving to and from a bed to chair (including a wheelchair)?: A Little Help needed walking in hospital room?: A Little Help needed climbing 3-5 steps with a railing? : A Lot 6 Click Score: 18    End of Session Equipment Utilized During Treatment: Gait belt Activity Tolerance: Patient tolerated treatment well Patient left: in bed;with call bell/phone within reach;with bed alarm set Nurse Communication: Mobility status PT Visit Diagnosis: Unsteadiness on feet (R26.81);Repeated falls (R29.6);History of falling (Z91.81);Muscle weakness (generalized) (M62.81)    Time: [id number] PT Time Calculation (min) (ACUTE ONLY): 30 min   Charges:   PT Evaluation $PT Eval Low Complexity: 1 Low PT Treatments $Gait Training: 8-22 mins   PT G Codes:   PT G-Codes **NOT FOR INPATIENT CLASS** Functional Assessment Tool Used: AM-PAC 6 Clicks Basic Mobility;Clinical judgement Functional Limitation: Mobility: Walking and moving around Mobility: Walking and Moving Around Current Status ([id number](G8978): At least 40 percent but less than 60 percent impaired, limited or restricted Mobility: Walking and Moving Around Goal Status [phone](G8979): At least 40 percent but less than  60 percent impaired, limited or restricted    Jac Canavan PT, DPT

## 2017-02-17 NOTE — Progress Notes (Signed)
Pt IV removed, WNL. D/C instructions given to pt. Verbalized understanding. Pt son at bedside to transport home.  

## 2017-02-17 NOTE — Discharge Summary (Signed)
Physician Discharge Summary  Alexandra Riley WUX:[id number] DOB: 05/28/1921 DOA: 02/15/2017  PCP: Bernerd Limbo, MD  Admit date: 02/15/2017 Discharge date: 02/17/2017  Admitted From: Home Disposition:  Home   Recommendations for Outpatient Follow-up:  1. Follow up with PCP in 1-2 weeks 2. Get repeat urinalysis at follow up 3. Please obtain BMP/CBC in one week 4. Take antibiotics as prescribed  Home Health: No  Equipment/Devices: None   Discharge Condition: Stable CODE STATUS: Full code Diet recommendation: Regular diet  Brief/Interim Summary: Alexandra Riley a 83 y.o.female,with history of hyperlipidemia, osteoporosis came to hospital with gradual onset of worsening generalized weakness. Patient says that she has poor by mouth intake with decreased appetite and recurrent falls. Patient says that she fell last night and complains of right-sided chest pain. Rib x-rays shows acute mildly displaced right sixth and seventh rib fractures. Patient also found to have abnormal UA.With positive nitrite.  Patient denies dysuria, urgency or frequency of urination. She complains of right-sided chest pain, no shortness of breath. Complains of nausea but no vomiting. No diarrhea. Patient complains of constipation  In the ED patient was given ceftriaxone for UTI.  Urine culture growing E. Coli.  She was transitioned to Keflex at discharge.  Son states patient has been falling repeatedly at home because she will not wait for help.  She is supposed to be admitted to a nursing home on 10/22.  Discharge Diagnoses:  Active Problems:   UTI (urinary tract infection)    Discharge Instructions  Discharge Instructions    Call MD for:  difficulty breathing, headache or visual disturbances    Complete by:  As directed    Call MD for:  extreme fatigue    Complete by:  As directed    Call MD for:  hives    Complete by:  As directed    Call MD for:  persistant dizziness or  light-headedness    Complete by:  As directed    Call MD for:  persistant nausea and vomiting    Complete by:  As directed    Call MD for:  redness, tenderness, or signs of infection (pain, swelling, redness, odor or green/yellow discharge around incision site)    Complete by:  As directed    Call MD for:  severe uncontrolled pain    Complete by:  As directed    Call MD for:  temperature >100.4    Complete by:  As directed    Diet - low sodium heart healthy    Complete by:  As directed    Discharge instructions    Complete by:  As directed    Take prescriptions as prescribed Fill antibiotic Repeat UA at end of this week Follow up with your PCP within 10 days   Increase activity slowly    Complete by:  As directed      Allergies as of 02/17/2017   No Known Allergies     Medication List    STOP taking these medications   TRAVEL SICKNESS 50 MG tablet Generic drug:  dimenhyDRINATE     TAKE these medications   aspirin EC 81 MG tablet Take 81 mg by mouth every morning.   carisoprodol 350 MG tablet Commonly known as:  SOMA Take 350 mg by mouth 4 (four) times daily as needed for muscle spasms.   cephALEXin 500 MG capsule Commonly known as:  KEFLEX Take 1 capsule (500 mg total) by mouth every 12 (twelve) hours.   clonazePAM 0.5 MG tablet  Commonly known as:  KLONOPIN Take 1 tablet (0.5 mg total) by mouth at bedtime. Take 1 tablet by mouth once a day for anxiety for 14 days until 12/07/2016   HYDROcodone-acetaminophen 5-325 MG tablet Commonly known as:  NORCO/VICODIN Take 1 tablet by mouth every 6 (six) hours as needed for moderate pain (Must last 30 days.Do not take and drive a car or use machinery.).   ondansetron 4 MG tablet Commonly known as:  ZOFRAN Take 4 mg by mouth every 8 (eight) hours as needed for nausea or vomiting.   polyethylene glycol packet Commonly known as:  MIRALAX / GLYCOLAX Take 17 g by mouth daily.       No Known  Allergies  Consultations:  Nutrition  PT   Procedures/Studies: Dg Ribs Unilateral W/chest Right  Result Date: 02/15/2017 CLINICAL DATA:  Fall with rib pain EXAM: RIGHT RIBS AND CHEST - 3+ VIEW COMPARISON:  01/12/2017, 03/23/2016 FINDINGS: Single-view chest demonstrates no acute consolidation or pleural effusion. Stable cardiomediastinal silhouette with atherosclerosis. No pneumothorax. Right rib series demonstrates acute mildly displaced right sixth and seventh rib fractures. IMPRESSION: 1. Negative for pneumothorax or pleural effusion 2. Acute mildly displaced right sixth and seventh rib fractures. Electronically Signed   By: Jasmine PangKim  Fujinaga M.D.   On: 02/15/2017 19:30   Dg Lumbar Spine Complete  Result Date: 02/15/2017 CLINICAL DATA:  Fall with low back pain EXAM: LUMBAR SPINE - COMPLETE 4+ VIEW COMPARISON:  01/13/2007 FINDINGS: Scoliosis of the spine. Treated compression deformity at L1. Remaining vertebral body heights are maintained. Mild degenerative changes at L1-L2, L2-L3 and L3-L4. Aortic vascular calcification. Calcified phleboliths in the pelvis. IMPRESSION: 1. No acute osseous abnormality. 2. Scoliosis with treated compression deformity at L1. Electronically Signed   By: Jasmine PangKim  Fujinaga M.D.   On: 02/15/2017 19:27   Ct Head Wo Contrast  Result Date: 02/15/2017 CLINICAL DATA:  Increasing weakness. Patient has fallen 3 times over the past 3 days. EXAM: CT HEAD WITHOUT CONTRAST CT CERVICAL SPINE WITHOUT CONTRAST TECHNIQUE: Multidetector CT imaging of the head and cervical spine was performed following the standard protocol without intravenous contrast. Multiplanar CT image reconstructions of the cervical spine were also generated. COMPARISON:  01/12/2017 FINDINGS: CT HEAD FINDINGS BRAIN: There is sulcal and ventricular prominence consistent with superficial and central atrophy. No intraparenchymal hemorrhage, mass effect nor midline shift. Periventricular and subcortical white matter  hypodensities consistent with chronic small vessel ischemic disease are identified. No acute large vascular territory infarcts. No abnormal extra-axial fluid collections. Basal cisterns are not effaced and midline. VASCULAR: Mild to moderate calcific atherosclerosis of the carotid siphons. No hyperdense vessels. SKULL: No skull fracture. No significant scalp soft tissue swelling. SINUSES/ORBITS: The mastoid air-cells are clear. The included paranasal sinuses are well-aerated.The included ocular globes and orbital contents are non-suspicious. Right cataract extraction. OTHER: None. CT CERVICAL SPINE FINDINGS Alignment: Intact craniocervical relationship and atlantodental interval. Maintained cervical lordosis. Skull base and vertebrae: No acute cervical spine fracture. Mild T4 height loss, stable since previous. Soft tissues and spinal canal: Atherosclerotic calcifications of the extracranial carotid arteries at the bifurcations. No adenopathy. No prevertebral fluid or swelling. No visible canal hematoma. Disc levels: Facet arthropathy the left at C3-4 and C4-5. Significant degenerative disc disease or focal disc herniation. Upper chest: Apical scarring noted bilaterally. Other: None IMPRESSION: 1. Cerebral atrophy with chronic small vessel ischemia. No acute intracranial abnormality. 2. No acute cervical spine fracture or posttraumatic subluxation. Chronic stable mild compression of T4. Electronically Signed   By: Onalee Huaavid  Sterling Big M.D.   On: 02/15/2017 19:41   Ct Cervical Spine Wo Contrast  Result Date: 02/15/2017 CLINICAL DATA:  Increasing weakness. Patient has fallen 3 times over the past 3 days. EXAM: CT HEAD WITHOUT CONTRAST CT CERVICAL SPINE WITHOUT CONTRAST TECHNIQUE: Multidetector CT imaging of the head and cervical spine was performed following the standard protocol without intravenous contrast. Multiplanar CT image reconstructions of the cervical spine were also generated. COMPARISON:  01/12/2017 FINDINGS:  CT HEAD FINDINGS BRAIN: There is sulcal and ventricular prominence consistent with superficial and central atrophy. No intraparenchymal hemorrhage, mass effect nor midline shift. Periventricular and subcortical white matter hypodensities consistent with chronic small vessel ischemic disease are identified. No acute large vascular territory infarcts. No abnormal extra-axial fluid collections. Basal cisterns are not effaced and midline. VASCULAR: Mild to moderate calcific atherosclerosis of the carotid siphons. No hyperdense vessels. SKULL: No skull fracture. No significant scalp soft tissue swelling. SINUSES/ORBITS: The mastoid air-cells are clear. The included paranasal sinuses are well-aerated.The included ocular globes and orbital contents are non-suspicious. Right cataract extraction. OTHER: None. CT CERVICAL SPINE FINDINGS Alignment: Intact craniocervical relationship and atlantodental interval. Maintained cervical lordosis. Skull base and vertebrae: No acute cervical spine fracture. Mild T4 height loss, stable since previous. Soft tissues and spinal canal: Atherosclerotic calcifications of the extracranial carotid arteries at the bifurcations. No adenopathy. No prevertebral fluid or swelling. No visible canal hematoma. Disc levels: Facet arthropathy the left at C3-4 and C4-5. Significant degenerative disc disease or focal disc herniation. Upper chest: Apical scarring noted bilaterally. Other: None IMPRESSION: 1. Cerebral atrophy with chronic small vessel ischemia. No acute intracranial abnormality. 2. No acute cervical spine fracture or posttraumatic subluxation. Chronic stable mild compression of T4. Electronically Signed   By: Tollie Eth M.D.   On: 02/15/2017 19:41      Subjective: Patient is doing relatively well- she is tolerating her diet, worked with PT yesterday. Son states she is going to a SNF in two days.  Patient had cup full of brown sputum but was found "dipping" chewing  tobacco.  Discharge Exam: Vitals:   02/16/17 2025 02/17/17 0528  BP: 118/63 (!) 120/58  Pulse: 62 60  Resp: 20 18  Temp: 98 F (36.7 C) 98.5 F (36.9 C)  SpO2: 97% 95%   Vitals:   02/16/17 0543 02/16/17 1148 02/16/17 2025 02/17/17 0528  BP: (!) 116/49  118/63 (!) 120/58  Pulse: 67  62 60  Resp: 20  20 18   Temp: 97.9 F (36.6 C)  98 F (36.7 C) 98.5 F (36.9 C)  TempSrc: Oral  Oral Oral  SpO2: 98%  97% 95%  Weight:  53.1 kg (117 lb 1 oz)    Height:        General: Pt is alert, awake, not in acute distress Cardiovascular: RRR, S1/S2 +, no rubs, no gallops Respiratory: CTA bilaterally, no wheezing, no rhonchi Abdominal: Soft, NT, ND, bowel sounds + Extremities: no edema, no cyanosis    The results of significant diagnostics from this hospitalization (including imaging, microbiology, ancillary and laboratory) are listed below for reference.     Microbiology: Recent Results (from the past 240 hour(s))  Urine culture     Status: Abnormal (Preliminary result)   Collection Time: 02/15/17  5:18 PM  Result Value Ref Range Status   Specimen Description URINE, RANDOM  Final   Special Requests NONE  Final   Culture >=100,000 COLONIES/mL ESCHERICHIA COLI (A)  Final   Report Status PENDING  Incomplete  Labs: BNP (last 3 results) No results for input(s): BNP in the last 8760 hours. Basic Metabolic Panel:  Recent Labs Lab 02/15/17 1731 02/16/17 0436  NA 148* 142  K 3.6 3.4*  CL 112* 112*  CO2 26 23  GLUCOSE 95 95  BUN 9 8  CREATININE 1.00 0.69  CALCIUM 9.8 8.8*   Liver Function Tests:  Recent Labs Lab 02/15/17 1731 02/16/17 0436  AST 23 17  ALT 15 12*  ALKPHOS 72 65  BILITOT 0.5 0.4  PROT 6.3* 5.6*  ALBUMIN 3.8 3.3*    Recent Labs Lab 02/15/17 1731  LIPASE 37   No results for input(s): AMMONIA in the last 168 hours. CBC:  Recent Labs Lab 02/15/17 1731 02/16/17 0436  WBC 6.6 9.8  NEUTROABS 4.2  --   HGB 12.0 11.3*  HCT 38.6 36.0  MCV  93.9 93.3  PLT 160 150   Cardiac Enzymes:  Recent Labs Lab 02/15/17 1731 02/15/17 2030 02/16/17 0436 02/16/17 1053  TROPONINI 0.06* 0.06* 0.06* 0.06*   BNP: Invalid input(s): POCBNP CBG: No results for input(s): GLUCAP in the last 168 hours. D-Dimer No results for input(s): DDIMER in the last 72 hours. Hgb A1c No results for input(s): HGBA1C in the last 72 hours. Lipid Profile No results for input(s): CHOL, HDL, LDLCALC, TRIG, CHOLHDL, LDLDIRECT in the last 72 hours. Thyroid function studies No results for input(s): TSH, T4TOTAL, T3FREE, THYROIDAB in the last 72 hours.  Invalid input(s): FREET3 Anemia work up No results for input(s): VITAMINB12, FOLATE, FERRITIN, TIBC, IRON, RETICCTPCT in the last 72 hours. Urinalysis    Component Value Date/Time   COLORURINE YELLOW 02/15/2017 1718   APPEARANCEUR HAZY (A) 02/15/2017 1718   LABSPEC 1.024 02/15/2017 1718   PHURINE 5.0 02/15/2017 1718   GLUCOSEU NEGATIVE 02/15/2017 1718   HGBUR SMALL (A) 02/15/2017 1718   BILIRUBINUR NEGATIVE 02/15/2017 1718   KETONESUR NEGATIVE 02/15/2017 1718   PROTEINUR 30 (A) 02/15/2017 1718   UROBILINOGEN 0.2 11/11/2014 0910   NITRITE POSITIVE (A) 02/15/2017 1718   LEUKOCYTESUR LARGE (A) 02/15/2017 1718   Sepsis Labs Invalid input(s): PROCALCITONIN,  WBC,  LACTICIDVEN Microbiology Recent Results (from the past 240 hour(s))  Urine culture     Status: Abnormal (Preliminary result)   Collection Time: 02/15/17  5:18 PM  Result Value Ref Range Status   Specimen Description URINE, RANDOM  Final   Special Requests NONE  Final   Culture >=100,000 COLONIES/mL ESCHERICHIA COLI (A)  Final   Report Status PENDING  Incomplete     Time coordinating discharge: 35 minutes  SIGNED:   Katrinka Blazing, MD  [facility] 02/17/2017, 1:34 PM Pager [phone] If 7PM-7AM, please contact night-coverage [email or web address] Password [email or web address]

## 2017-02-17 NOTE — Progress Notes (Signed)
PROGRESS NOTE    Alexandra OvermanRuth W Riley  ZOX:[id number] DOB: 25-Dec-1921 DOA: 02/15/2017 PCP: Bernerd LimboAriza, Fernando Enrique, MD    Brief Narrative:   Alexandra FitchRuth Riley  is a 83 y.o. female, with history of hyperlipidemia, osteoporosis came to hospital with gradual onset of worsening generalized weakness. Patient says that she has poor by mouth intake with decreased appetite and recurrent falls. Patient says that she fell last night and complains of right-sided chest pain. Rib x-rays shows acute mildly displaced right sixth and seventh rib fractures. Patient also found to have abnormal UA.With positive nitrite.  Patient denies dysuria, urgency or frequency of urination. She complains of right-sided chest pain, no shortness of breath. Complains of nausea but no vomiting. No diarrhea. Patient complains of constipation  In the ED patient was given ceftriaxone for UTI.  Urine culture growing E. Coli.   Assessment & Plan:   Active Problems:   UTI (urinary tract infection)  UTI - continue rocephin - culture showing E. Coli but sensitivities not back - will transition to PO antibiotics when sensitivities back as patient has been unsteady and falls could be secondary to urinary tract infection   Right sixth and seventh rib fractures - continue vicodin for pain - continue home soma for pain  Elevated troponin - Unclear etiology - plateaued - EKG shows nonspecific T-wave abnormalities - no chest pain complaints  Recurrent falls -PT/ OT evaluation once patient's pain is controlled. - needs 24hr supervision  Constipation - Continue daily MiraLAX.   DVT prophylaxis: lovenox Code Status: Full Code Family Communication: son is bedside Disposition Plan: likely home when improved likely tomorrow when sensitivities result   Consultants:   None  Procedures:   None  Antimicrobials:   Rocephin    Subjective: Patient sitting up in bed.  Minimally PO intake yesterday secondary to  nausea.     Objective: Vitals:   02/16/17 0543 02/16/17 1148 02/16/17 2025 02/17/17 0528  BP: (!) 116/49  118/63 (!) 120/58  Pulse: 67  62 60  Resp: 20  20 18   Temp: 97.9 F (36.6 C)  98 F (36.7 C) 98.5 F (36.9 C)  TempSrc: Oral  Oral Oral  SpO2: 98%  97% 95%  Weight:  53.1 kg (117 lb 1 oz)    Height:        Intake/Output Summary (Last 24 hours) at 02/17/17 1049 Last data filed at 02/17/17 0529  Gross per 24 hour  Intake              290 ml  Output              150 ml  Net              140 ml   Filed Weights   02/15/17 1703 02/16/17 0000 02/16/17 1148  Weight: 51.7 kg (114 lb) 50.1 kg (110 lb 7.2 oz) 53.1 kg (117 lb 1 oz)    Examination:  General exam: Appears tired, elderly female Respiratory system: Clear to auscultation. Respiratory effort normal. Cardiovascular system: S1 & S2 heard, RRR. No JVD, murmurs, rubs, gallops or clicks. No pedal edema. Gastrointestinal system: Abdomen is nondistended, soft and minimally tender in the left lower quadrant. No organomegaly or masses felt. Normal bowel sounds heard. Central nervous system:  No focal neurological deficits. Extremities: Symmetric 5 x 5 power. Skin: No rashes, lesions or ulcers Psychiatry: Mood & affect appropriate.     Data Reviewed: I have personally reviewed following labs and imaging studies  CBC:  Recent Labs Lab 02/15/17 1731 02/16/17 0436  WBC 6.6 9.8  NEUTROABS 4.2  --   HGB 12.0 11.3*  HCT 38.6 36.0  MCV 93.9 93.3  PLT 160 150   Basic Metabolic Panel:  Recent Labs Lab 02/15/17 1731 02/16/17 0436  NA 148* 142  K 3.6 3.4*  CL 112* 112*  CO2 26 23  GLUCOSE 95 95  BUN 9 8  CREATININE 1.00 0.69  CALCIUM 9.8 8.8*   GFR: Estimated Creatinine Clearance: 37.6 mL/min (by C-G formula based on SCr of 0.69 mg/dL). Liver Function Tests:  Recent Labs Lab 02/15/17 1731 02/16/17 0436  AST 23 17  ALT 15 12*  ALKPHOS 72 65  BILITOT 0.5 0.4  PROT 6.3* 5.6*  ALBUMIN 3.8 3.3*     Recent Labs Lab 02/15/17 1731  LIPASE 37   No results for input(s): AMMONIA in the last 168 hours. Coagulation Profile: No results for input(s): INR, PROTIME in the last 168 hours. Cardiac Enzymes:  Recent Labs Lab 02/15/17 1731 02/15/17 2030 02/16/17 0436 02/16/17 1053  TROPONINI 0.06* 0.06* 0.06* 0.06*   BNP (last 3 results) No results for input(s): PROBNP in the last 8760 hours. HbA1C: No results for input(s): HGBA1C in the last 72 hours. CBG: No results for input(s): GLUCAP in the last 168 hours. Lipid Profile: No results for input(s): CHOL, HDL, LDLCALC, TRIG, CHOLHDL, LDLDIRECT in the last 72 hours. Thyroid Function Tests: No results for input(s): TSH, T4TOTAL, FREET4, T3FREE, THYROIDAB in the last 72 hours. Anemia Panel: No results for input(s): VITAMINB12, FOLATE, FERRITIN, TIBC, IRON, RETICCTPCT in the last 72 hours. Sepsis Labs:  Recent Labs Lab 02/15/17 1731 02/15/17 2016  LATICACIDVEN 1.2 1.1    Recent Results (from the past 240 hour(s))  Urine culture     Status: Abnormal (Preliminary result)   Collection Time: 02/15/17  5:18 PM  Result Value Ref Range Status   Specimen Description URINE, RANDOM  Final   Special Requests NONE  Final   Culture >=100,000 COLONIES/mL ESCHERICHIA COLI (A)  Final   Report Status PENDING  Incomplete         Radiology Studies: Dg Ribs Unilateral W/chest Right  Result Date: 02/15/2017 CLINICAL DATA:  Fall with rib pain EXAM: RIGHT RIBS AND CHEST - 3+ VIEW COMPARISON:  01/12/2017, 03/23/2016 FINDINGS: Single-view chest demonstrates no acute consolidation or pleural effusion. Stable cardiomediastinal silhouette with atherosclerosis. No pneumothorax. Right rib series demonstrates acute mildly displaced right sixth and seventh rib fractures. IMPRESSION: 1. Negative for pneumothorax or pleural effusion 2. Acute mildly displaced right sixth and seventh rib fractures. Electronically Signed   By: Jasmine Pang M.D.   On:  02/15/2017 19:30   Dg Lumbar Spine Complete  Result Date: 02/15/2017 CLINICAL DATA:  Fall with low back pain EXAM: LUMBAR SPINE - COMPLETE 4+ VIEW COMPARISON:  01/13/2007 FINDINGS: Scoliosis of the spine. Treated compression deformity at L1. Remaining vertebral body heights are maintained. Mild degenerative changes at L1-L2, L2-L3 and L3-L4. Aortic vascular calcification. Calcified phleboliths in the pelvis. IMPRESSION: 1. No acute osseous abnormality. 2. Scoliosis with treated compression deformity at L1. Electronically Signed   By: Jasmine Pang M.D.   On: 02/15/2017 19:27   Ct Head Wo Contrast  Result Date: 02/15/2017 CLINICAL DATA:  Increasing weakness. Patient has fallen 3 times over the past 3 days. EXAM: CT HEAD WITHOUT CONTRAST CT CERVICAL SPINE WITHOUT CONTRAST TECHNIQUE: Multidetector CT imaging of the head and cervical spine was performed following the standard protocol without intravenous contrast.  Multiplanar CT image reconstructions of the cervical spine were also generated. COMPARISON:  01/12/2017 FINDINGS: CT HEAD FINDINGS BRAIN: There is sulcal and ventricular prominence consistent with superficial and central atrophy. No intraparenchymal hemorrhage, mass effect nor midline shift. Periventricular and subcortical white matter hypodensities consistent with chronic small vessel ischemic disease are identified. No acute large vascular territory infarcts. No abnormal extra-axial fluid collections. Basal cisterns are not effaced and midline. VASCULAR: Mild to moderate calcific atherosclerosis of the carotid siphons. No hyperdense vessels. SKULL: No skull fracture. No significant scalp soft tissue swelling. SINUSES/ORBITS: The mastoid air-cells are clear. The included paranasal sinuses are well-aerated.The included ocular globes and orbital contents are non-suspicious. Right cataract extraction. OTHER: None. CT CERVICAL SPINE FINDINGS Alignment: Intact craniocervical relationship and  atlantodental interval. Maintained cervical lordosis. Skull base and vertebrae: No acute cervical spine fracture. Mild T4 height loss, stable since previous. Soft tissues and spinal canal: Atherosclerotic calcifications of the extracranial carotid arteries at the bifurcations. No adenopathy. No prevertebral fluid or swelling. No visible canal hematoma. Disc levels: Facet arthropathy the left at C3-4 and C4-5. Significant degenerative disc disease or focal disc herniation. Upper chest: Apical scarring noted bilaterally. Other: None IMPRESSION: 1. Cerebral atrophy with chronic small vessel ischemia. No acute intracranial abnormality. 2. No acute cervical spine fracture or posttraumatic subluxation. Chronic stable mild compression of T4. Electronically Signed   By: Tollie Eth M.D.   On: 02/15/2017 19:41   Ct Cervical Spine Wo Contrast  Result Date: 02/15/2017 CLINICAL DATA:  Increasing weakness. Patient has fallen 3 times over the past 3 days. EXAM: CT HEAD WITHOUT CONTRAST CT CERVICAL SPINE WITHOUT CONTRAST TECHNIQUE: Multidetector CT imaging of the head and cervical spine was performed following the standard protocol without intravenous contrast. Multiplanar CT image reconstructions of the cervical spine were also generated. COMPARISON:  01/12/2017 FINDINGS: CT HEAD FINDINGS BRAIN: There is sulcal and ventricular prominence consistent with superficial and central atrophy. No intraparenchymal hemorrhage, mass effect nor midline shift. Periventricular and subcortical white matter hypodensities consistent with chronic small vessel ischemic disease are identified. No acute large vascular territory infarcts. No abnormal extra-axial fluid collections. Basal cisterns are not effaced and midline. VASCULAR: Mild to moderate calcific atherosclerosis of the carotid siphons. No hyperdense vessels. SKULL: No skull fracture. No significant scalp soft tissue swelling. SINUSES/ORBITS: The mastoid air-cells are clear. The  included paranasal sinuses are well-aerated.The included ocular globes and orbital contents are non-suspicious. Right cataract extraction. OTHER: None. CT CERVICAL SPINE FINDINGS Alignment: Intact craniocervical relationship and atlantodental interval. Maintained cervical lordosis. Skull base and vertebrae: No acute cervical spine fracture. Mild T4 height loss, stable since previous. Soft tissues and spinal canal: Atherosclerotic calcifications of the extracranial carotid arteries at the bifurcations. No adenopathy. No prevertebral fluid or swelling. No visible canal hematoma. Disc levels: Facet arthropathy the left at C3-4 and C4-5. Significant degenerative disc disease or focal disc herniation. Upper chest: Apical scarring noted bilaterally. Other: None IMPRESSION: 1. Cerebral atrophy with chronic small vessel ischemia. No acute intracranial abnormality. 2. No acute cervical spine fracture or posttraumatic subluxation. Chronic stable mild compression of T4. Electronically Signed   By: Tollie Eth M.D.   On: 02/15/2017 19:41        Scheduled Meds: . aspirin EC  81 mg Oral q morning - 10a  . clonazePAM  0.5 mg Oral QHS  . enoxaparin (LOVENOX) injection  30 mg Subcutaneous Q24H  . feeding supplement (ENSURE ENLIVE)  237 mL Oral BID BM  . multivitamin with minerals  1 tablet Oral Daily  . polyethylene glycol  17 g Oral Daily   Continuous Infusions: . cefTRIAXone (ROCEPHIN)  IV Stopped (02/16/17 2122)     LOS: 1 day    Time spent: 20 minutes    Katrinka Blazing, MD [facility] Pager [phone]  If 7PM-7AM, please contact night-coverage [email or web address] Password [email or web address] 02/17/2017, 10:49 AM

## 2017-02-18 LAB — URINE CULTURE: Culture: >=100000 — AB

## 2017-03-01 ENCOUNTER — Encounter: Payer: Self-pay | Admitting: Orthopaedic Surgery

## 2017-03-01 ENCOUNTER — Telehealth: Payer: Self-pay | Admitting: Orthopaedic Surgery

## 2017-03-01 NOTE — Telephone Encounter (Signed)
Call back received from patient/daughter. Relayed per Dr Sanjuan DameKeeling's response. Voiced understanding, however, said that they do not know off hand who Dr Izola PriceMyers is.  Will check further into, and will discuss all at Ms. Ferriss's next appointment w/Dr Hilda LiasKeeling.

## 2017-03-01 NOTE — Telephone Encounter (Signed)
Denied.  She has gotten narcotics from Dr. Izola PriceMyers.  Needs to continue with that doctor.

## 2017-03-01 NOTE — Telephone Encounter (Signed)
Patient/daughter, Alexandra Riley, designated contact, called to request refill:  HYDROcodone-acetaminophen (NORCO/VICODIN) 5-325 MG tablet 30 tablet

## 2017-04-11 ENCOUNTER — Ambulatory Visit: Payer: Medicare Other | Admitting: Orthopaedic Surgery

## 2017-05-26 ENCOUNTER — Other Ambulatory Visit
Admission: RE | Admit: 2017-05-26 | Discharge: 2017-05-26 | Disposition: A | Discharge status: Home / Self Care | Payer: Medicare Other | Source: Ambulatory Visit | Attending: Internal Medicine | Admitting: Internal Medicine

## 2017-05-26 LAB — CBC WITH DIFFERENTIAL/PLATELET
BASOS ABS: 0.1 10*3/uL (ref 0–0.1)
Basophils Relative: 1 %
EOS PCT: 1 %
Eosinophils Absolute: 0.1 10*3/uL (ref 0–0.7)
HEMATOCRIT: 38.6 % (ref 35.0–47.0)
Hemoglobin: 12.8 g/dL (ref 12.0–16.0)
LYMPHS ABS: 1.3 10*3/uL (ref 1.0–3.6)
LYMPHS PCT: 16 %
MCH: 29.9 pg (ref 26.0–34.0)
MCHC: 33.1 g/dL (ref 32.0–36.0)
MCV: 90.1 fL (ref 80.0–100.0)
MONO ABS: 0.9 10*3/uL (ref 0.2–0.9)
MONOS PCT: 11 %
NEUTROS ABS: 5.7 10*3/uL (ref 1.4–6.5)
Neutrophils Relative %: 71 %
PLATELETS: 215 10*3/uL (ref 150–440)
RBC: 4.28 MIL/uL (ref 3.80–5.20)
RDW: 14.9 % — AB (ref 11.5–14.5)
WBC: 8.2 10*3/uL (ref 3.6–11.0)

## 2017-05-26 LAB — COMPREHENSIVE METABOLIC PANEL
ALT: 24 U/L (ref 14–54)
ANION GAP: 13 (ref 5–15)
AST: 32 U/L (ref 15–41)
Albumin: 3.9 g/dL (ref 3.5–5.0)
Alkaline Phosphatase: 76 U/L (ref 38–126)
BUN: 27 mg/dL — AB (ref 6–20)
CHLORIDE: 108 mmol/L (ref 101–111)
CO2: 22 mmol/L (ref 22–32)
Calcium: 9.3 mg/dL (ref 8.9–10.3)
Creatinine, Ser: 0.81 mg/dL (ref 0.44–1.00)
GFR calc Af Amer: >60 mL/min (ref >60)
Glucose, Bld: 95 mg/dL (ref 65–99)
POTASSIUM: 4 mmol/L (ref 3.5–5.1)
Sodium: 143 mmol/L (ref 135–145)
TOTAL PROTEIN: 6.3 g/dL — AB (ref 6.5–8.1)

## 2017-07-21 ENCOUNTER — Emergency Department (HOSPITAL_COMMUNITY)
Admission: EM | Admit: 2017-07-21 | Discharge: 2017-07-21 | Disposition: A | Discharge status: Home / Self Care | Payer: Medicare Other | Attending: Emergency Medicine | Admitting: Emergency Medicine

## 2017-07-21 ENCOUNTER — Encounter (HOSPITAL_COMMUNITY): Payer: Self-pay | Admitting: Emergency Medicine

## 2017-07-21 ENCOUNTER — Other Ambulatory Visit: Payer: Self-pay

## 2017-07-21 ENCOUNTER — Emergency Department (HOSPITAL_COMMUNITY): Payer: Medicare Other

## 2017-07-21 DIAGNOSIS — E7800 Pure hypercholesterolemia, unspecified: Secondary | ICD-10-CM | POA: Diagnosis not present

## 2017-07-21 DIAGNOSIS — R140 Abdominal distension (gaseous): Secondary | ICD-10-CM | POA: Diagnosis not present

## 2017-07-21 DIAGNOSIS — E785 Hyperlipidemia, unspecified: Secondary | ICD-10-CM | POA: Diagnosis not present

## 2017-07-21 DIAGNOSIS — Z7982 Long term (current) use of aspirin: Secondary | ICD-10-CM | POA: Insufficient documentation

## 2017-07-21 DIAGNOSIS — F0390 Unspecified dementia without behavioral disturbance: Secondary | ICD-10-CM | POA: Insufficient documentation

## 2017-07-21 DIAGNOSIS — N939 Abnormal uterine and vaginal bleeding, unspecified: Secondary | ICD-10-CM | POA: Diagnosis present

## 2017-07-21 DIAGNOSIS — K625 Hemorrhage of anus and rectum: Secondary | ICD-10-CM | POA: Diagnosis not present

## 2017-07-21 DIAGNOSIS — K5730 Diverticulosis of large intestine without perforation or abscess without bleeding: Secondary | ICD-10-CM | POA: Insufficient documentation

## 2017-07-21 HISTORY — DX: Chronic pain syndrome: G89.4

## 2017-07-21 HISTORY — DX: Anorexia: R63.0

## 2017-07-21 HISTORY — DX: Unspecified dementia, unspecified severity, without behavioral disturbance, psychotic disturbance, mood disturbance, and anxiety: F03.90

## 2017-07-21 HISTORY — DX: Major depressive disorder, single episode, unspecified: F32.9

## 2017-07-21 HISTORY — DX: Adult failure to thrive: R62.7

## 2017-07-21 HISTORY — DX: Anxiety disorder, unspecified: F41.9

## 2017-07-21 HISTORY — DX: Gastro-esophageal reflux disease without esophagitis: K21.9

## 2017-07-21 HISTORY — DX: Unspecified hemorrhoids: K64.9

## 2017-07-21 HISTORY — DX: Polyneuropathy, unspecified: G62.9

## 2017-07-21 HISTORY — DX: Hyperlipidemia, unspecified: E78.5

## 2017-07-21 LAB — COMPREHENSIVE METABOLIC PANEL
ALBUMIN: 4 g/dL (ref 3.5–5.0)
ALT: 41 U/L (ref 14–54)
AST: 31 U/L (ref 15–41)
Alkaline Phosphatase: 86 U/L (ref 38–126)
Anion gap: 11 (ref 5–15)
BUN: 23 mg/dL — AB (ref 6–20)
CHLORIDE: 109 mmol/L (ref 101–111)
CO2: 25 mmol/L (ref 22–32)
Calcium: 9.9 mg/dL (ref 8.9–10.3)
Creatinine, Ser: 0.71 mg/dL (ref 0.44–1.00)
GFR calc Af Amer: >60 mL/min (ref >60)
GFR calc non Af Amer: >60 mL/min (ref >60)
Glucose, Bld: 102 mg/dL — ABNORMAL HIGH (ref 65–99)
POTASSIUM: 4.1 mmol/L (ref 3.5–5.1)
SODIUM: 145 mmol/L (ref 135–145)
Total Bilirubin: 0.6 mg/dL (ref 0.3–1.2)
Total Protein: 7.4 g/dL (ref 6.5–8.1)

## 2017-07-21 LAB — CBC WITH DIFFERENTIAL/PLATELET
BASOS ABS: 0 10*3/uL (ref 0.0–0.1)
BASOS PCT: 0 %
EOS PCT: 1 %
Eosinophils Absolute: 0.1 10*3/uL (ref 0.0–0.7)
HCT: 42.4 % (ref 36.0–46.0)
Hemoglobin: 12.9 g/dL (ref 12.0–15.0)
Lymphocytes Relative: 16 %
Lymphs Abs: 1.4 10*3/uL (ref 0.7–4.0)
MCH: 28.7 pg (ref 26.0–34.0)
MCHC: 30.4 g/dL (ref 30.0–36.0)
MCV: 94.2 fL (ref 78.0–100.0)
MONO ABS: 0.5 10*3/uL (ref 0.1–1.0)
Monocytes Relative: 6 %
Neutro Abs: 6.8 10*3/uL (ref 1.7–7.7)
Neutrophils Relative %: 77 %
PLATELETS: 215 10*3/uL (ref 150–400)
RBC: 4.5 MIL/uL (ref 3.87–5.11)
RDW: 14.3 % (ref 11.5–15.5)
WBC: 8.8 10*3/uL (ref 4.0–10.5)

## 2017-07-21 LAB — LIPASE, BLOOD: LIPASE: 130 U/L — AB (ref 11–51)

## 2017-07-21 LAB — POC OCCULT BLOOD, ED: FECAL OCCULT BLD: POSITIVE — AB

## 2017-07-21 MED ORDER — SODIUM CHLORIDE 0.9 % IV SOLN
INTRAVENOUS | Status: DC
  Administered 2017-07-21: 21:00:00 via INTRAVENOUS

## 2017-07-21 MED ORDER — ACETAMINOPHEN 325 MG PO TABS
650.0000 mg | ORAL_TABLET | Freq: Once | ORAL | Status: AC
  Administered 2017-07-21: 650 mg via ORAL
  Filled 2017-07-21: qty 2

## 2017-07-21 MED ORDER — IOPAMIDOL (ISOVUE-300) INJECTION 61%
100.0000 mL | Freq: Once | INTRAVENOUS | Status: AC | PRN
  Administered 2017-07-21: 100 mL via INTRAVENOUS

## 2017-07-21 NOTE — Discharge Instructions (Addendum)
Stents of workup here tonight without proof of blood in the vaginal area or rectal area.  Rectal area was heme positive on guaiac but no gross blood.  CT scan of abdomen and pelvis without any acute findings.  Patient's hemoglobin hematocrit is stable.  Close observation at the nursing facility return for recurrent extensive bleeding.  Otherwise follow-up with her doctors.

## 2017-07-21 NOTE — ED Provider Notes (Signed)
[facility] EMERGENCY DEPARTMENT Provider Note   CSN: [id number] Arrival date & time: 07/21/17  1758     History   Chief Complaint Chief Complaint  Patient presents with  . Vaginal Bleeding    HPI Alexandra Riley is a 84 y.o. female.  Patient brought in from nursing facility by EMS.  Concern was for vaginal bleeding with bright red blood.  Patient nontoxic no acute distress.  Patient without any specific complaints.  No abdominal complaints.     Past Medical History:  Diagnosis Date  . Anorexia   . Anxiety   . Chronic pain syndrome   . Constipation   . Dementia   . Failure to thrive in adult   . GERD (gastroesophageal reflux disease)   . Glaucoma   . Hemorrhoids   . High cholesterol   . Hyperlipidemia   . Major depressive disorder   . Neuropathy   . Osteoporosis   . Shortness of breath on exertion     Patient Active Problem List   Diagnosis Date Noted  . UTI (urinary tract infection) 02/15/2017  . Acute mid back pain   . Pain 01/12/2017  . Papule of skin 12/01/2016  . Back pain   . Compression fracture of L1 lumbar vertebra, closed, initial encounter (HCC) 11/20/2016  . Chronic left shoulder pain 10/03/2016  . Abdominal pain 07/05/2015  . Hematometra 06/24/2015  . Postmenopausal vaginal bleeding 06/24/2015  . Hypokalemia 11/11/2014  . Recurrent falls 11/11/2014  . Rhabdomyolysis 11/11/2014  . Hip fracture (HCC) 05/26/2014    Past Surgical History:  Procedure Laterality Date  . CATARACT EXTRACTION W/PHACO Right 01/20/2013   Procedure: CATARACT EXTRACTION PHACO AND INTRAOCULAR LENS PLACEMENT (IOC);  Surgeon: Gemma PayorKerry Hunt, MD;  Location: [facility] ORS;  Service: Ophthalmology;  Laterality: Right;  CDE:  18.27  . CESAREAN SECTION    . EYE SURGERY    . IR KYPHO LUMBAR INC FX REDUCE BONE BX UNI/BIL CANNULATION INC/IMAGING  11/24/2016  . ORIF HIP FRACTURE Right 05/28/2014   Procedure: OPEN REDUCTION INTERNAL FIXATION HIP;  Surgeon: Darreld McleanWayne Keeling, MD;  Location: [facility]  ORS;  Service: Orthopedics;  Laterality: Right;  . TONSILLECTOMY       OB History    Gravida  4   Para  3   Term  3   Preterm      AB      Living  3     SAB      TAB      Ectopic      Multiple      Live Births               Home Medications    Prior to Admission medications   Medication Sig Start Date End Date Taking? Authorizing Provider  aspirin EC 81 MG tablet Take 81 mg by mouth every morning.    [provider]  carisoprodol (SOMA) 350 MG tablet Take 350 mg by mouth 4 (four) times daily as needed for muscle spasms.    [provider]  clonazePAM (KLONOPIN) 0.5 MG tablet Take 1 tablet (0.5 mg total) by mouth at bedtime. Take 1 tablet by mouth once a day for anxiety for 14 days until 12/07/2016 01/13/17   Dorothea OgleMyers, Iskra M, MD  HYDROcodone-acetaminophen (NORCO/VICODIN) 5-325 MG tablet Take 1 tablet by mouth every 6 (six) hours as needed for moderate pain (Must last 30 days.Do not take and drive a car or use machinery.). 01/13/17   Dorothea OgleMyers, Iskra M, MD  ondansetron (ZOFRAN) 4 MG tablet Take 4 mg by mouth every 8 (eight) hours as needed for nausea or vomiting.  11/22/15   [provider]  polyethylene glycol (MIRALAX / GLYCOLAX) packet Take 17 g by mouth daily. Patient not taking: Reported on 02/15/2017 11/22/16   Marinda Elk, MD    Family History Family History  Problem Relation Age of Onset  . Colon cancer Neg Hx     Social History Social History   Tobacco Use  . Smoking status: Never Smoker  . Smokeless tobacco: Current User    Types: Snuff  Substance Use Topics  . Alcohol use: No  . Drug use: No     Allergies   Patient has no known allergies.   Review of Systems Review of Systems  Unable to perform ROS: Dementia     Physical Exam Updated Vital Signs BP 140/62   Pulse 83   Temp 99.9 F (37.7 C) (Oral)   Resp 18   Wt 54.4 kg (120 lb)   SpO2 93%   BMI 19.97 kg/m   Physical Exam  Constitutional: She  appears well-developed and well-nourished. No distress.  HENT:  Head: Normocephalic and atraumatic.  Mucous membranes slightly dry.  Eyes: Pupils are equal, round, and reactive to light. Conjunctivae and EOM are normal.  Neck: Neck supple.  Cardiovascular: Normal rate and regular rhythm.  Pulmonary/Chest: Effort normal and breath sounds normal.  Abdominal: Soft. Bowel sounds are normal. There is no tenderness.  Genitourinary:  Genitourinary Comments: Rectal exam with brown stool some external hemorrhoid skin tags no gross blood.  Was trace heme positive.  Vaginal exam was done first showed no evidence of any blood in the vaginal vault.  Musculoskeletal: Normal range of motion. She exhibits no edema.  Neurological: She is alert. No cranial nerve deficit or sensory deficit. She exhibits normal muscle tone. Coordination normal.  Skin: Skin is warm. No rash noted.  Nursing note and vitals reviewed.    ED Treatments / Results  Labs (all labs ordered are listed, but only abnormal results are displayed) Labs Reviewed  COMPREHENSIVE METABOLIC PANEL - Abnormal; Notable for the following components:      Result Value   Glucose, Bld 102 (*)    BUN 23 (*)    All other components within normal limits  LIPASE, BLOOD - Abnormal; Notable for the following components:   Lipase 130 (*)    All other components within normal limits  POC OCCULT BLOOD, ED - Abnormal; Notable for the following components:   Fecal Occult Bld POSITIVE (*)    All other components within normal limits  CBC WITH DIFFERENTIAL/PLATELET    EKG None  Radiology Ct Abdomen Pelvis W Contrast  Result Date: 07/21/2017 CLINICAL DATA:  Abdominal distension. Patient reports vaginal bleeding with clots. EXAM: CT ABDOMEN AND PELVIS WITH CONTRAST TECHNIQUE: Multidetector CT imaging of the abdomen and pelvis was performed using the standard protocol following bolus administration of intravenous contrast. CONTRAST:  ISOVUE-300  IOPAMIDOL (ISOVUE-300) INJECTION 61% COMPARISON:  CT 11/20/2016 FINDINGS: Lower chest: Small left pleural effusion with adjacent compressive atelectasis. Streaky atelectasis in the right lower lobe. Hepatobiliary: Decreased hepatic density consistent with steatosis. No discrete focal lesion. Gallbladder physiologically distended without pericholecystic inflammation or calcified gallstone. Mild prominence of the common bile duct likely normal for age. Pancreas: No ductal dilatation or inflammation. Spleen: Normal in size without focal abnormality. Adrenals/Urinary Tract: Normal adrenal glands. Extrarenal pelvis configuration of the left kidney. No hydronephrosis or perinephric  edema. Small cysts in the lower left kidney. Ureters are decompressed. Urinary bladder is physiologically distended. Stomach/Bowel: Stomach is nondistended. No small bowel dilatation, wall thickening or inflammatory change. Normal appendix. Liquid and formed stool throughout the colon with equivocal wall thickening at the hepatic flexure. Descending and sigmoid colonic diverticulosis without diverticulitis. Rectum mildly distended with stool and air. No bowel obstruction. Vascular/Lymphatic: Aortic atherosclerosis and tortuosity. No aneurysm. No enlarged abdominal or pelvic lymph nodes. Reproductive: Periuterine vascular calcifications. No endometrial thickening by CT. No adnexal mass. Other: No free air, free fluid, or intra-abdominal fluid collection. Musculoskeletal: Treated L1 compression fracture with kyphoplasty. Degenerative change in the spine. Surgical hardware in the right hip, partially included. IMPRESSION: 1. Liquid and solid stool throughout the colon with equivocal colonic wall thickening at the splenic flexure. This can be seen with diarrheal process and mild colitis, recommend correlation for clinical history. Colonic diverticulosis without diverticulitis. 2. No explanation for vaginal bleeding on CT. No evidence of endometrial  thickening. Ultrasound may provide better definition of the endometrium based on clinical concern. 3.  Aortic Atherosclerosis (ICD10-I70.0). Electronically Signed   By: Rubye Oaks M.D.   On: 07/21/2017 22:15    Procedures Procedures (including critical care time)  Medications Ordered in ED Medications  0.9 %  sodium chloride infusion ( Intravenous New Bag/Given 07/21/17 2046)  acetaminophen (TYLENOL) tablet 650 mg (650 mg Oral Given 07/21/17 2058)  iopamidol (ISOVUE-300) 61 % injection 100 mL (100 mLs Intravenous Contrast Given 07/21/17 2147)     Initial Impression / Assessment and Plan / ED Course  I have reviewed the triage vital signs and the nursing notes.  Pertinent labs & imaging results that were available during my care of the patient were reviewed by me and considered in my medical decision making (see chart for details).     Patient with evaluation here.  On rectal exam there was trace heme positive no blood in the vaginal vault area.  Not exactly clear where the blood came from.  Patient does have some external hemorrhoids that may have been some hemorrhoidal bleeding.  No evidence of any significant bleeding here no evidence of any hypotension or tachycardia basic labs without significant abnormalities.  CT scan of the abdomen showed no abnormalities.  Patient stable for discharge back to nursing facility.  Patient nontoxic no acute distress.  Final Clinical Impressions(s) / ED Diagnoses   Final diagnoses:  Rectal bleeding    ED Discharge Orders    None       Vanetta Mulders, MD 07/26/17 [phone]

## 2017-07-21 NOTE — ED Notes (Signed)
Pt unable to sign transportation papers

## 2017-07-21 NOTE — ED Triage Notes (Signed)
Patient complains of bright red Vaginal Bleeding with clots.

## 2017-08-29 ENCOUNTER — Ambulatory Visit (INDEPENDENT_AMBULATORY_CARE_PROVIDER_SITE_OTHER): Payer: Medicare Other | Admitting: Gastroenterology

## 2017-08-29 ENCOUNTER — Encounter: Payer: Self-pay | Admitting: Gastroenterology

## 2017-08-29 ENCOUNTER — Encounter

## 2017-08-29 VITALS — BP 125/53 | HR 77 | Temp 97.0°F | Ht 63.0 in

## 2017-08-29 DIAGNOSIS — K5900 Constipation, unspecified: Secondary | ICD-10-CM | POA: Insufficient documentation

## 2017-08-29 DIAGNOSIS — R1010 Upper abdominal pain, unspecified: Secondary | ICD-10-CM

## 2017-08-29 DIAGNOSIS — R600 Localized edema: Secondary | ICD-10-CM

## 2017-08-29 MED ORDER — HYDROCORTISONE 2.5 % RE CREA: 1.0000 "application " | TOPICAL_CREAM | Freq: Two times a day (BID) | RECTAL | 1 refills | Status: DC

## 2017-08-29 MED ORDER — LUBIPROSTONE 8 MCG PO CAPS
8.0000 ug | ORAL_CAPSULE | Freq: Two times a day (BID) | ORAL | 3 refills | Status: DC
Start: 2017-08-29 — End: 2017-11-30

## 2017-08-29 MED ORDER — LUBIPROSTONE 8 MCG PO CAPS
8.0000 ug | ORAL_CAPSULE | Freq: Two times a day (BID) | ORAL | 3 refills | Status: DC
Start: 2017-08-29 — End: 2017-08-29

## 2017-08-29 NOTE — Patient Instructions (Signed)
For constipation: I would like for you to stop the Colace and Miralax. Instead, start taking Amitiza 1 gelcap twice a day WITH FOOD TO AVOID NAUSEA.   Use the anusol cream twice a day for rectal discomfort.  Return in 4-6 weeks for follow-up.  Please talk to the nursing staff and your primary care regarding your painful big toe and the swelling of your feet.   It was a pleasure to see you today. I strive to create trusting relationships with patients to provide genuine, compassionate, and quality care. I value your feedback. If you receive a survey regarding your visit,  I greatly appreciate you taking time to fill this out.   Gelene Mink, PhD, ANP-BC [facility]

## 2017-08-31 NOTE — Assessment & Plan Note (Signed)
Start Amitiza 8 mcg po BID with food. Stop Miralax and colace but may need to add this back in. Could titrate Amitiza up if needed. Return for close follow-up in 4-6 weeks. Anusol cream for rectal discomfort. Rectal bleeding likely benign anorectal source in setting of constipation. Would not pursue colonoscopy at this point: CT findings reviewed. Patient declining any invasive evaluation, which I feel is appropriate at her age.

## 2017-08-31 NOTE — Progress Notes (Signed)
Primary Care Physician:  Bernerd Limbo, MD Primary Gastroenterologist:  Dr. Jena Gauss   Chief Complaint  Patient presents with  . Rectal Bleeding    HPI:   Alexandra Riley is a 84 y.o. female presenting today at the request of her PCP secondary to rectal bleeding. Last seen in March 2017 with chronic abdominal pain. 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 She saw Dr. Coral Else and felt unlikely to have chronic mesenteric ischemia but did feel median arcuate ligament syndrome could be playing a role. Options include angiography to determine if the ligament was compressing the celiac artery, as it would not be a good lesion to stent. However, if stenosis noted amenable to stenting, could pursue this to help alleviate symptoms. She did not want to pursue this. We have not seen her since that time.  Most recent CT March 2019 with equivocal wall thickening at splenic flexure, query secondary to diarrheal process or mild colitis. Heme positive in ED March 2019 but normal CBC at 12.9.   No family is present today. One of the staff from [facility] is here. Patient states she continues to have pain with eating. No dysphagia. Poor taste. Drinks boost and water mainly. Enjoys banana pudding and hot dogs when family brings this, but she is unable to tell me if this causes pain as well. No N/V. Wheelchair bound. Notes left great toe pain. Significant pedal edema. Tells me she is told she can't prop her feet up at the facility. Not interested in invasive procedures.   Feels constipated. Hemorrhoid flares at times. Thinks she may have seen some bleeding but not sure. Taking colace and Miralax. States she is never given the Miralax. States she is not given hemorrhoid cream either.   Past Medical History:  Diagnosis Date  . Anorexia   . Anxiety   . Chronic pain syndrome   . Constipation     . Dementia   . Failure to thrive in adult   . GERD (gastroesophageal reflux disease)   . Glaucoma   . Hemorrhoids   . High cholesterol   . Hyperlipidemia   . Major depressive disorder   . Neuropathy   . Osteoporosis   . Shortness of breath on exertion     Past Surgical History:  Procedure Laterality Date  . CATARACT EXTRACTION W/PHACO Right 01/20/2013   Procedure: CATARACT EXTRACTION PHACO AND INTRAOCULAR LENS PLACEMENT (IOC);  Surgeon: Gemma Payor, MD;  Location: [facility];  Service: Ophthalmology;  Laterality: Right;  CDE:  18.27  . CESAREAN SECTION    . EYE SURGERY    . IR KYPHO LUMBAR INC FX REDUCE BONE BX UNI/BIL CANNULATION INC/IMAGING  11/24/2016  . ORIF HIP FRACTURE Right 05/28/2014   Procedure: OPEN REDUCTION INTERNAL FIXATION HIP;  Surgeon: Darreld Mclean, MD;  Location: [facility];  Service: Orthopedics;  Laterality: Right;  . TONSILLECTOMY      Current Outpatient Medications  Medication Sig Dispense Refill  . Alum & Mag Hydroxide-Simeth (MYLANTA PO) Take by mouth. 30CC every 2 hours as needed    . aspirin EC 81 MG tablet Take 81 mg by mouth every morning.    . baclofen (LIORESAL) 10 MG tablet Take 10 mg by mouth 2 (two) times daily.    . Cholecalciferol (VITAMIN D3) 2000 units TABS Take 1 tablet by mouth daily.    . clonazePAM (KLONOPIN) 0.5 MG tablet Take  1 tablet (0.5 mg total) by mouth at bedtime. Take 1 tablet by mouth once a day for anxiety for 14 days until 12/07/2016 10 tablet 0  . denosumab (PROLIA) 60 MG/ML SOSY injection Inject 60 mg into the skin every 6 (six) months.    . docusate sodium (COLACE) 100 MG capsule Take 100 mg by mouth 2 (two) times daily.    . ferrous sulfate 325 (65 FE) MG tablet Take 325 mg by mouth daily with breakfast.    . gabapentin (NEURONTIN) 100 MG capsule Take 200 mg by mouth at bedtime.    Marland Kitchen HYDROcodone-acetaminophen (NORCO/VICODIN) 5-325 MG tablet Take 1 tablet by mouth every 6 (six) hours as needed for moderate pain (Must last 30 days.Do  not take and drive a car or use machinery.). (Patient taking differently: Take 1 tablet by mouth 3 (three) times daily. ) 30 tablet 0  . hydrocortisone (ANUSOL-HC) 25 MG suppository Place 25 mg rectally at bedtime.    . Hypromellose (ARTIFICIAL TEARS OP) Apply 1 drop to eye every 12 (twelve) hours as needed.    . Menthol, Topical Analgesic, 5 % GEL Apply topically as needed.    . mirtazapine (REMERON) 15 MG tablet Take 15 mg by mouth at bedtime.    Marland Kitchen Neomycin-Bacitracin-Polymyxin (TRIPLE ANTIBIOTIC EX) Apply topically 2 (two) times daily as needed.    Marland Kitchen omeprazole (PRILOSEC) 40 MG capsule Take 40 mg by mouth 2 (two) times daily before a meal.    . ondansetron (ZOFRAN) 4 MG tablet Take 4 mg by mouth every 8 (eight) hours as needed for nausea or vomiting.     . polyethylene glycol (MIRALAX / GLYCOLAX) packet Take 17 g by mouth daily. 14 each 0  . senna (SENOKOT) 8.6 MG TABS tablet Take 1 tablet by mouth 2 (two) times daily.    . sertraline (ZOLOFT) 25 MG tablet Take 50 mg by mouth daily.    Weyman Croon Hazel (PREPARATION H EX) Apply topically every 6 (six) hours as needed.    . hydrocortisone (ANUSOL-HC) 2.5 % rectal cream Place 1 application rectally 2 (two) times daily. 30 g 1  . lubiprostone (AMITIZA) 8 MCG capsule Take 1 capsule (8 mcg total) by mouth 2 (two) times daily with a meal. 60 capsule 3   No current facility-administered medications for this visit.     Allergies as of 08/29/2017  . (No Known Allergies)    Family History  Problem Relation Age of Onset  . Colon cancer Neg Hx     Social History   Socioeconomic History  . Marital status: Widowed    Spouse name: Not on file  . Number of children: Not on file  . Years of education: Not on file  . Highest education level: Not on file  Occupational History  . Not on file  Social Needs  . Financial resource strain: Not on file  . Food insecurity:    Worry: Not on file    Inability: Not on file  . Transportation needs:     Medical: Not on file    Non-medical: Not on file  Tobacco Use  . Smoking status: Never Smoker  . Smokeless tobacco: Current User    Types: Snuff  Substance and Sexual Activity  . Alcohol use: No  . Drug use: No  . Sexual activity: Never  Lifestyle  . Physical activity:    Days per week: Not on file    Minutes per session: Not on file  . Stress: Not  on file  Relationships  . Social connections:    Talks on phone: Not on file    Gets together: Not on file    Attends religious service: Not on file    Active member of club or organization: Not on file    Attends meetings of clubs or organizations: Not on file    Relationship status: Not on file  . Intimate partner violence:    Fear of current or ex partner: Not on file    Emotionally abused: Not on file    Physically abused: Not on file    Forced sexual activity: Not on file  Other Topics Concern  . Not on file  Social History Narrative  . Not on file    Review of Systems: As mentioned in HPI   Physical Exam: BP (!) 125/53   Pulse 77   Temp (!) 97 F (36.1 C) (Oral)   Ht  (1.6 m)   BMI 21.26 kg/m  General:   Alert and oriented. Pleasant and cooperative. Appears stated age.  Head:  Normocephalic and atraumatic. Eyes:  Without icterus, sclera clear and conjunctiva pink.  Ears:  Moderately hard of hearing  Lungs:  Clear to auscultation bilaterally.  Abdomen:  +BS, soft, TTP upper abdomen and non-distended. Limited exam in chair as she is not able to get on exam table  Rectal:  Extremely limited but patient was raised to a standing position by two staff here, and I was able to see small external, non-thrombosed hemorrhoids and skin tags. DRE without obvious mass. No gross blood on finger.  Msk:  With kyphosis, unable to ambulate Extremities:  With marked pedal edema. Chronic venous statis changes  Neurologic:  Alert and  oriented x4 Psych:  Alert and cooperative. Normal mood and affect.  Lab Results  Component  Value Date   WBC 8.8 07/21/2017   HGB 12.9 07/21/2017   HCT 42.4 07/21/2017   MCV 94.2 07/21/2017   PLT 215 07/21/2017

## 2017-08-31 NOTE — Assessment & Plan Note (Signed)
Wheelchair bound. Multifactorial. Discuss with PCP.

## 2017-08-31 NOTE — Assessment & Plan Note (Signed)
Chronic, with prior evaluation as noted in 2017 in HPI. Previously evaluated by Vascular for possible median arcuate ligament syndrome but not felt to have chronic mesenteric ischemia. Again, she does not want any further procedures. Continue to monitor. Could pursue updated CTA or refer to vascular at next appt if patient is willing, but she seems hesitant to pursue any significant evaluation at this time.

## 2017-09-03 NOTE — Progress Notes (Signed)
cc'ed to pcp °

## 2017-10-02 ENCOUNTER — Ambulatory Visit (INDEPENDENT_AMBULATORY_CARE_PROVIDER_SITE_OTHER): Payer: Medicare Other | Admitting: Gastroenterology

## 2017-10-02 VITALS — BP 117/60 | HR 70 | Temp 97.1°F | Ht 63.0 in | Wt 119.4 lb

## 2017-10-02 DIAGNOSIS — K5900 Constipation, unspecified: Secondary | ICD-10-CM | POA: Diagnosis not present

## 2017-10-02 DIAGNOSIS — R1010 Upper abdominal pain, unspecified: Secondary | ICD-10-CM

## 2017-10-02 NOTE — Patient Instructions (Signed)
I am increasing your constipation medication dosage. You will take Amitiza 24 micrograms with food twice a day.   I would like to see you in 4 weeks and make sure your weight is stable and that things are better!  It was a pleasure to see you today. I strive to create trusting relationships with patients to provide genuine, compassionate, and quality care. I value your feedback. If you receive a survey regarding your visit,  I greatly appreciate you taking time to fill this out.   Gelene MinkAnna W. Delma Drone, PhD, ANP-BC [facility]

## 2017-10-02 NOTE — Progress Notes (Signed)
Primary Care Physician:  Bernerd LimboAriza, Fernando Enrique, MD  Primary GI: Dr. Jena Gaussourk   Chief Complaint  Patient presents with  . Hemorrhoids  . Constipation    HPI:   Alexandra Riley is a 84 y.o. female presenting today with a history of rectal bleeding. Last seen in May 2019 for constipation and rectal bleeding. History of chronic abdominal pain. 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 She saw Dr. Coral ElseVance Brabham and felt unlikely to have chronic mesenteric ischemia but did feel median arcuate ligament syndrome could be playing a role. Options include angiography to determine if the ligament was compressing the celiac artery, as it would not be a good lesion to stent. However, if stenosis noted amenable to stenting, could pursue this to help alleviate symptoms. She did not want to pursue this.    Most recent CT March 2019 with equivocal wall thickening at splenic flexure, query secondary to diarrheal process or mild colitis. Heme positive in ED March 2019 but normal CBC at 12.9.   Started Amitiza 8 mcg BID at last visit in May 2019. States she feels like she is straining. [facility] she is a good historian.   Hurting in chest this morning but not now.    Past Medical History:  Diagnosis Date  . Anorexia   . Anxiety   . Chronic pain syndrome   . Constipation   . Dementia   . Failure to thrive in adult   . GERD (gastroesophageal reflux disease)   . Glaucoma   . Hemorrhoids   . High cholesterol   . Hyperlipidemia   . Major depressive disorder   . Neuropathy   . Osteoporosis   . Shortness of breath on exertion     Past Surgical History:  Procedure Laterality Date  . CATARACT EXTRACTION W/PHACO Right 01/20/2013   Procedure: CATARACT EXTRACTION PHACO AND INTRAOCULAR LENS PLACEMENT (IOC);  Surgeon: Gemma PayorKerry Hunt, MD;  Location: [facility];  Service: Ophthalmology;  Laterality:  Right;  CDE:  18.27  . CESAREAN SECTION    . EYE SURGERY    . IR KYPHO LUMBAR INC FX REDUCE BONE BX UNI/BIL CANNULATION INC/IMAGING  11/24/2016  . ORIF HIP FRACTURE Right 05/28/2014   Procedure: OPEN REDUCTION INTERNAL FIXATION HIP;  Surgeon: Darreld McleanWayne Keeling, MD;  Location: [facility];  Service: Orthopedics;  Laterality: Right;  . TONSILLECTOMY      Current Outpatient Medications  Medication Sig Dispense Refill  . Alum & Mag Hydroxide-Simeth (MYLANTA PO) Take by mouth. 30CC every 2 hours as needed    . aspirin EC 81 MG tablet Take 81 mg by mouth every morning.    . baclofen (LIORESAL) 10 MG tablet Take 10 mg by mouth 2 (two) times daily.    . Cholecalciferol (VITAMIN D3) 2000 units TABS Take 1 tablet by mouth daily.    . clonazePAM (KLONOPIN) 0.5 MG tablet Take 1 tablet (0.5 mg total) by mouth at bedtime. Take 1 tablet by mouth once a day for anxiety for 14 days until 12/07/2016 (Patient taking differently: Take 0.25 mg by mouth 3 (three) times daily. ) 10 tablet 0  . denosumab (PROLIA) 60 MG/ML SOSY injection Inject 60 mg into the skin every 6 (six) months.    . ferrous sulfate 325 (65 FE) MG tablet Take 325 mg by mouth daily with breakfast.    . gabapentin (NEURONTIN) 100 MG capsule Take 200 mg  by mouth at bedtime.    Marland Kitchen HYDROcodone-acetaminophen (NORCO/VICODIN) 5-325 MG tablet Take 1 tablet by mouth every 6 (six) hours as needed for moderate pain (Must last 30 days.Do not take and drive a car or use machinery.). (Patient taking differently: Take 1 tablet by mouth 3 (three) times daily. ) 30 tablet 0  . hydrocortisone (ANUSOL-HC) 25 MG suppository Place 25 mg rectally at bedtime.    . Hypromellose (ARTIFICIAL TEARS OP) Apply 1 drop to eye every 12 (twelve) hours as needed.    . lubiprostone (AMITIZA) 8 MCG capsule Take 1 capsule (8 mcg total) by mouth 2 (two) times daily with a meal. 60 capsule 3  . Menthol, Topical Analgesic, 5 % GEL Apply topically as needed.    . mirtazapine (REMERON) 15 MG  tablet Take 15 mg by mouth at bedtime.    Marland Kitchen omeprazole (PRILOSEC) 40 MG capsule Take 40 mg by mouth 2 (two) times daily before a meal.    . ondansetron (ZOFRAN) 4 MG tablet Take 4 mg by mouth every 8 (eight) hours as needed for nausea or vomiting.     . polyethylene glycol (MIRALAX / GLYCOLAX) packet Take 17 g by mouth daily. 14 each 0  . senna (SENOKOT) 8.6 MG TABS tablet Take 1 tablet by mouth 2 (two) times daily.    . sertraline (ZOLOFT) 25 MG tablet Take 50 mg by mouth daily.    . traZODone (DESYREL) 25 mg TABS tablet Take 25 mg by mouth at bedtime.    Weyman Croon Hazel (PREPARATION H EX) Apply topically every 6 (six) hours as needed.    . docusate sodium (COLACE) 100 MG capsule Take 100 mg by mouth 2 (two) times daily.     No current facility-administered medications for this visit.     Allergies as of 10/02/2017  . (No Known Allergies)    Family History  Problem Relation Age of Onset  . Colon cancer Neg Hx     Social History   Socioeconomic History  . Marital status: Widowed    Spouse name: Not on file  . Number of children: Not on file  . Years of education: Not on file  . Highest education level: Not on file  Occupational History  . Not on file  Social Needs  . Financial resource strain: Not on file  . Food insecurity:    Worry: Not on file    Inability: Not on file  . Transportation needs:    Medical: Not on file    Non-medical: Not on file  Tobacco Use  . Smoking status: Never Smoker  . Smokeless tobacco: Current User    Types: Snuff  Substance and Sexual Activity  . Alcohol use: No  . Drug use: No  . Sexual activity: Never  Lifestyle  . Physical activity:    Days per week: Not on file    Minutes per session: Not on file  . Stress: Not on file  Relationships  . Social connections:    Talks on phone: Not on file    Gets together: Not on file    Attends religious service: Not on file    Active member of club or organization: Not on file    Attends  meetings of clubs or organizations: Not on file    Relationship status: Not on file  Other Topics Concern  . Not on file  Social History Narrative  . Not on file    Review of Systems: Gen: see HPI  CV: see HPI  Resp: Denies dyspnea at rest, cough, wheezing, coughing up blood, and pleurisy. GI: see HPI  Derm: Denies rash, itching, dry skin Psych: Denies depression, anxiety, memory loss, confusion. No homicidal or suicidal ideation.  Heme: Denies bruising, bleeding, and enlarged lymph nodes.  Physical Exam: BP 117/60   Pulse 70   Temp (!) 97.1 F (36.2 C) (Oral)   Ht 5\' 3"  (1.6 m)   Wt 119 lb 6.4 oz (54.2 kg)   BMI 21.15 kg/m  General:   Alert and oriented. No distress noted. Pleasant and cooperative.  Head:  Normocephalic and atraumatic. Eyes:  Conjuctiva clear without scleral icterus. Mouth:  Oral mucosa pink and moist.  Abdomen:  +BS, soft, non-tender and non-distended. No rebound or guarding. Sitting in wheelchair Msk:  Kyphosis, in wheelchair, unable to ambulate.  Extremities:  With improved pedal edema from prior visit  Neurologic:  Alert and  oriented x4 Psych:  Alert and cooperative. Normal mood and affect.

## 2017-10-08 NOTE — Assessment & Plan Note (Signed)
Chronic, with prior evaluation as noted in 2017. Previously evaluated by Vascular for possible median arcuate ligament syndrome but not felt to have chronic mesenteric ischemia. Wants to avoid further evaluation. Weight is improved. Constipation playing a role. Will increase Amitiza dosing and have her return in 4-6 weeks for close follow-up and weight check.

## 2017-10-08 NOTE — Assessment & Plan Note (Signed)
With straining. Increase to Amitiza 24 mcg po BID with food.

## 2017-10-09 NOTE — Progress Notes (Signed)
CC'D TO PCP °

## 2017-11-06 ENCOUNTER — Ambulatory Visit: Payer: Medicare Other | Admitting: Gastroenterology

## 2017-11-30 ENCOUNTER — Encounter

## 2017-11-30 ENCOUNTER — Encounter: Payer: Self-pay | Admitting: Gastroenterology

## 2017-11-30 ENCOUNTER — Ambulatory Visit (INDEPENDENT_AMBULATORY_CARE_PROVIDER_SITE_OTHER): Payer: Medicare Other | Admitting: Gastroenterology

## 2017-11-30 VITALS — BP 90/52 | HR 72 | Temp 97.1°F | Ht 63.0 in

## 2017-11-30 DIAGNOSIS — K5900 Constipation, unspecified: Secondary | ICD-10-CM | POA: Diagnosis not present

## 2017-11-30 DIAGNOSIS — R110 Nausea: Secondary | ICD-10-CM | POA: Diagnosis not present

## 2017-11-30 NOTE — Assessment & Plan Note (Signed)
Has been difficult to manage; I had recently increased Amitiza to 24 mcg BID. Unclear if taking with or without food due to conflicting reports. Due to her reports of urgency, will decrease this to once daily with evening meal. Continue with Senna and Miralax. 4 week close follow-up.

## 2017-11-30 NOTE — Progress Notes (Signed)
Primary Care Physician:  Bernerd LimboAriza, Fernando Enrique, MD Primary GI: Dr. Jena Gaussourk   Chief Complaint  Patient presents with  . Constipation    "sometimes it runs out of me"    HPI:   Alexandra Riley is a 84 y.o. female presenting today with a history of constipation, chronic abdominal pain. 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 She saw Dr. Coral ElseVance Brabham and felt unlikely to have chronic mesenteric ischemia but did feel median arcuate ligament syndrome could be playing a role. Options include angiography to determine if the ligament was compressing the celiac artery, as it would not be a good lesion to stent. However, if stenosis noted amenable to stenting, could pursue this to help alleviate symptoms. She did not want to pursue this. Most recent CT March 2019 with equivocal wall thickening at splenic flexure, query secondary to diarrheal process or mild colitis. Heme positive in ED March 2019 but normal CBC at 12.9. Declining endoscopic evaluation.   Amitiza 8 mcg BID started in May 2019. At last visit in June 2019 felt like she was straining. June 2019, Amitiza was increased to 24 mcg BID.   [facility] was 115 on July [facility]th [facility]. 119 in June 2019. Highest this year  documented at 120 in March 2019. Lowest 114 in SEpt 2018. She notes stool is running out of her at times. More nausea noted since last visit but no vomiting. Abdominal discomfort at baseline. Has poor appetite. I spoke with nursing staff who state patient is fixated on bowel habits at times. They state Amitiza is being taken with meals; however, the caretaker present today state patient received all of her medications this morning but did not eat breakfast yet.   Past Medical History:  Diagnosis Date  . Anorexia   . Anxiety   . Chronic pain syndrome   . Constipation   . Dementia   . Failure to thrive in adult   .  GERD (gastroesophageal reflux disease)   . Glaucoma   . Hemorrhoids   . High cholesterol   . Hyperlipidemia   . Major depressive disorder   . Neuropathy   . Osteoporosis   . Shortness of breath on exertion     Past Surgical History:  Procedure Laterality Date  . CATARACT EXTRACTION W/PHACO Right 01/20/2013   Procedure: CATARACT EXTRACTION PHACO AND INTRAOCULAR LENS PLACEMENT (IOC);  Surgeon: Gemma PayorKerry Hunt, MD;  Location: [facility];  Service: Ophthalmology;  Laterality: Right;  CDE:  18.27  . CESAREAN SECTION    . EYE SURGERY    . IR KYPHO LUMBAR INC FX REDUCE BONE BX UNI/BIL CANNULATION INC/IMAGING  11/24/2016  . ORIF HIP FRACTURE Right 05/28/2014   Procedure: OPEN REDUCTION INTERNAL FIXATION HIP;  Surgeon: Darreld McleanWayne Keeling, MD;  Location: [facility];  Service: Orthopedics;  Laterality: Right;  . TONSILLECTOMY      Current Outpatient Medications  Medication Sig Dispense Refill  . aspirin EC 81 MG tablet Take 81 mg by mouth every morning.    . baclofen (LIORESAL) 10 MG tablet Take 10 mg by mouth 2 (two) times daily.    . Cholecalciferol (VITAMIN D3) 2000 units TABS Take 1 tablet by mouth daily.    . clonazePAM (KLONOPIN) 0.5 MG tablet Take 1 tablet (0.5 mg total) by mouth at bedtime. Take 1 tablet by mouth once a day for anxiety for 14 days until 12/07/2016 (Patient  taking differently: Take 0.25 mg by mouth 3 (three) times daily. ) 10 tablet 0  . denosumab (PROLIA) 60 MG/ML SOSY injection Inject 60 mg into the skin every 6 (six) months.    . ferrous sulfate 325 (65 FE) MG tablet Take 325 mg by mouth daily with breakfast.    . gabapentin (NEURONTIN) 100 MG capsule Take 200 mg by mouth at bedtime.    Marland Kitchen HYDROcodone-acetaminophen (NORCO/VICODIN) 5-325 MG tablet Take 1 tablet by mouth every 6 (six) hours as needed for moderate pain (Must last 30 days.Do not take and drive a car or use machinery.). (Patient taking differently: Take 1 tablet by mouth 3 (three) times daily. ) 30 tablet 0  . hydrocortisone  (ANUSOL-HC) 25 MG suppository Place 25 mg rectally at bedtime.    . Hypromellose (ARTIFICIAL TEARS OP) Apply 1 drop to eye every 12 (twelve) hours as needed.    . lubiprostone (AMITIZA) 24 MCG capsule Take 24 mcg by mouth 2 (two) times daily with a meal.    . Menthol, Topical Analgesic, 5 % GEL Apply topically as needed.    . mirtazapine (REMERON) 15 MG tablet Take 7.5 mg by mouth at bedtime.     Marland Kitchen omeprazole (PRILOSEC) 40 MG capsule Take 40 mg by mouth 2 (two) times daily before a meal.    . ondansetron (ZOFRAN) 4 MG tablet Take 4 mg by mouth every 8 (eight) hours as needed for nausea or vomiting.     . polyethylene glycol (MIRALAX / GLYCOLAX) packet Take 17 g by mouth daily. 14 each 0  . QUEtiapine (SEROQUEL) 25 MG tablet Take 12.5 mg by mouth at bedtime.    . senna (SENOKOT) 8.6 MG TABS tablet Take 1 tablet by mouth 2 (two) times daily.    . sertraline (ZOLOFT) 25 MG tablet Take 50 mg by mouth daily.    . traZODone (DESYREL) 25 mg TABS tablet Take 25 mg by mouth at bedtime.    Weyman Croon Hazel (PREPARATION H EX) Apply topically every 6 (six) hours as needed.     No current facility-administered medications for this visit.     Allergies as of 11/30/2017  . (No Known Allergies)    Family History  Problem Relation Age of Onset  . Colon cancer Neg Hx     Social History   Socioeconomic History  . Marital status: Widowed    Spouse name: Not on file  . Number of children: Not on file  . Years of education: Not on file  . Highest education level: Not on file  Occupational History  . Not on file  Social Needs  . Financial resource strain: Not on file  . Food insecurity:    Worry: Not on file    Inability: Not on file  . Transportation needs:    Medical: Not on file    Non-medical: Not on file  Tobacco Use  . Smoking status: Never Smoker  . Smokeless tobacco: Current User    Types: Snuff  Substance and Sexual Activity  . Alcohol use: No  . Drug use: No  . Sexual activity:  Never  Lifestyle  . Physical activity:    Days per week: Not on file    Minutes per session: Not on file  . Stress: Not on file  Relationships  . Social connections:    Talks on phone: Not on file    Gets together: Not on file    Attends religious service: Not on file  Active member of club or organization: Not on file    Attends meetings of clubs or organizations: Not on file    Relationship status: Not on file  Other Topics Concern  . Not on file  Social History Narrative  . Not on file    Review of Systems: Gen: feels weak, tired, chronically  CV: Denies chest pain, palpitations, syncope, peripheral edema, and claudication. Resp: Denies dyspnea at rest, cough, wheezing, coughing up blood, and pleurisy. GI: see HPI  Derm: Denies rash, itching, dry skin Psych: Denies depression, anxiety, memory loss, confusion. No homicidal or suicidal ideation.    Physical Exam: BP (!) 80/50, repeat 90/52   Pulse 72   Temp (!) 97.1 F (36.2 C) (Oral)   Ht 5\' 3"  (1.6 m)   BMI 21.15 kg/m  General:   Alert and oriented. Chronically ill appearing and frail  Head:  Normocephalic and atraumatic. Eyes:  Conjuctiva clear without scleral icterus. Mouth:  Oral mucosa pink and moist.  Abdomen:  +BS, soft, non-tender and non-distended. No rebound or guarding. No HSM or masses noted. Limited exam as she is in wheelchair and unable to stand  Msk:  Kyphosis  Neurologic:  Alert and  oriented x4

## 2017-11-30 NOTE — Assessment & Plan Note (Signed)
Query med effect in light of recent increase of Amitiza. Caretaker states she took this earlier this morning without eating. Conflicting reports of whether this is taken with or without food. Discussed classic side effect of Amitiza can be nausea. Symptoms seem to have worsened with increase in dosage. Decrease to only once daily with evening meal.   As of note, initial BP reading 80/50, with repeat 90/52 manually. I am not sure what her baseline BP runs, but I do see documentation of BP 110/55. I spoke with Diane, one of the nurses who knows her well. BP fluctuates per her report. I gave the updated manual BP to her. Patient is afebrile, no tachycardia.

## 2017-11-30 NOTE — Patient Instructions (Addendum)
I am decreasing Amitiza to only 24 micrograms once daily with supper. If your nausea continues, please have the facility call us.  We will see you back in about 4 weeks to see how you are doing.    It was a pleasure to see you today. I strive to create trusting relationships with patients to provide genuine, compassionate, and quality care. I value your feedback. If you receive a survey regarding your visit,  I greatly appreciate you taking time to fill this out.   Gelene MinkAnna W. Merry Pond, PhD, ANP-BC [facility]

## 2017-12-05 NOTE — Progress Notes (Signed)
cc'ed to pcp °

## 2018-01-07 ENCOUNTER — Telehealth: Payer: Self-pay | Admitting: Internal Medicine

## 2018-01-07 ENCOUNTER — Ambulatory Visit: Payer: Medicare Other | Admitting: Gastroenterology

## 2018-01-07 NOTE — Telephone Encounter (Signed)
Pt was in an URG spot to see AB today for rectal bleeding. Esperanza Heir from [facility] called to let us know that patient is refusing to come to her OV because she didn't want to get out of bed. I explained to Okey Regal that patient was put in an URG spot and it would be mid December if we rescheduled. She is aware of Dec OV and asked to be put on a wait list if sooner openings become available.

## 2018-01-07 NOTE — Telephone Encounter (Signed)
Noted  

## 2018-02-14 ENCOUNTER — Ambulatory Visit: Payer: Medicare Other | Admitting: Gastroenterology

## 2018-04-08 IMAGING — DX DG LUMBAR SPINE COMPLETE 4+V
5 series · 5 of 5 positions shown · non-contrast
Comparison: 01/13/2007

CLINICAL DATA: Fall with low back pain

EXAM:
LUMBAR SPINE - COMPLETE 4+ VIEW

[l-spine ap]
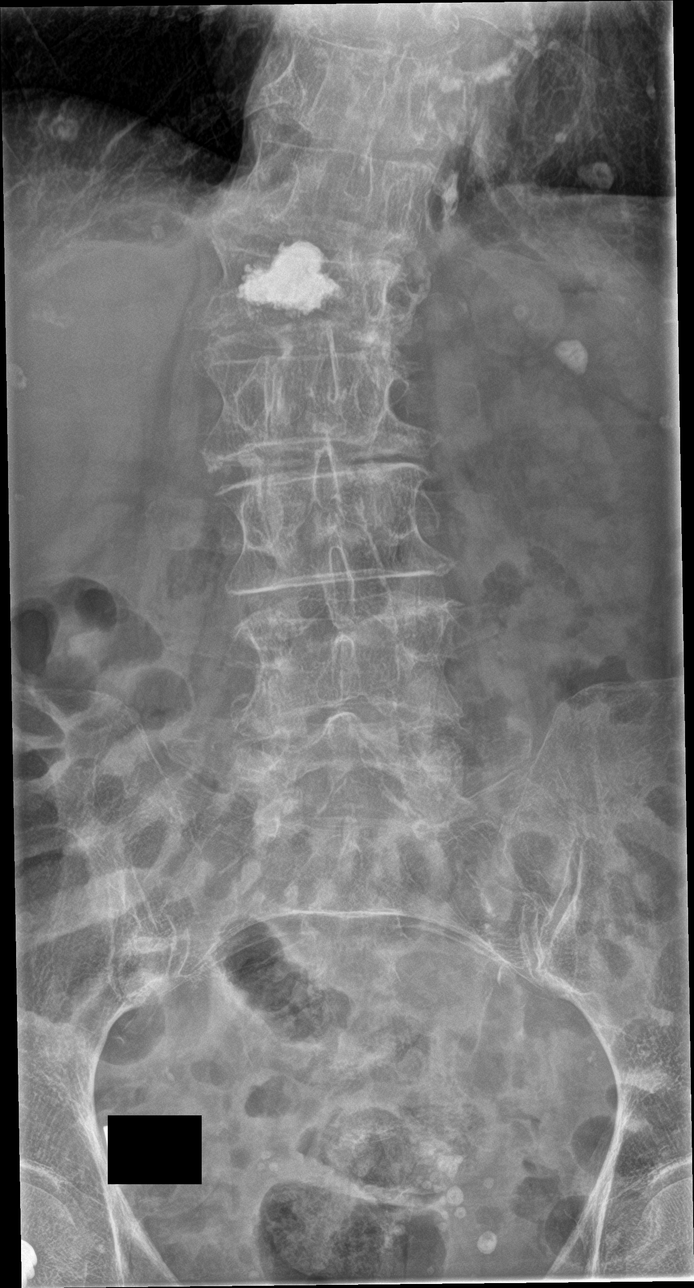

[l-spine obl (1 of 2)]
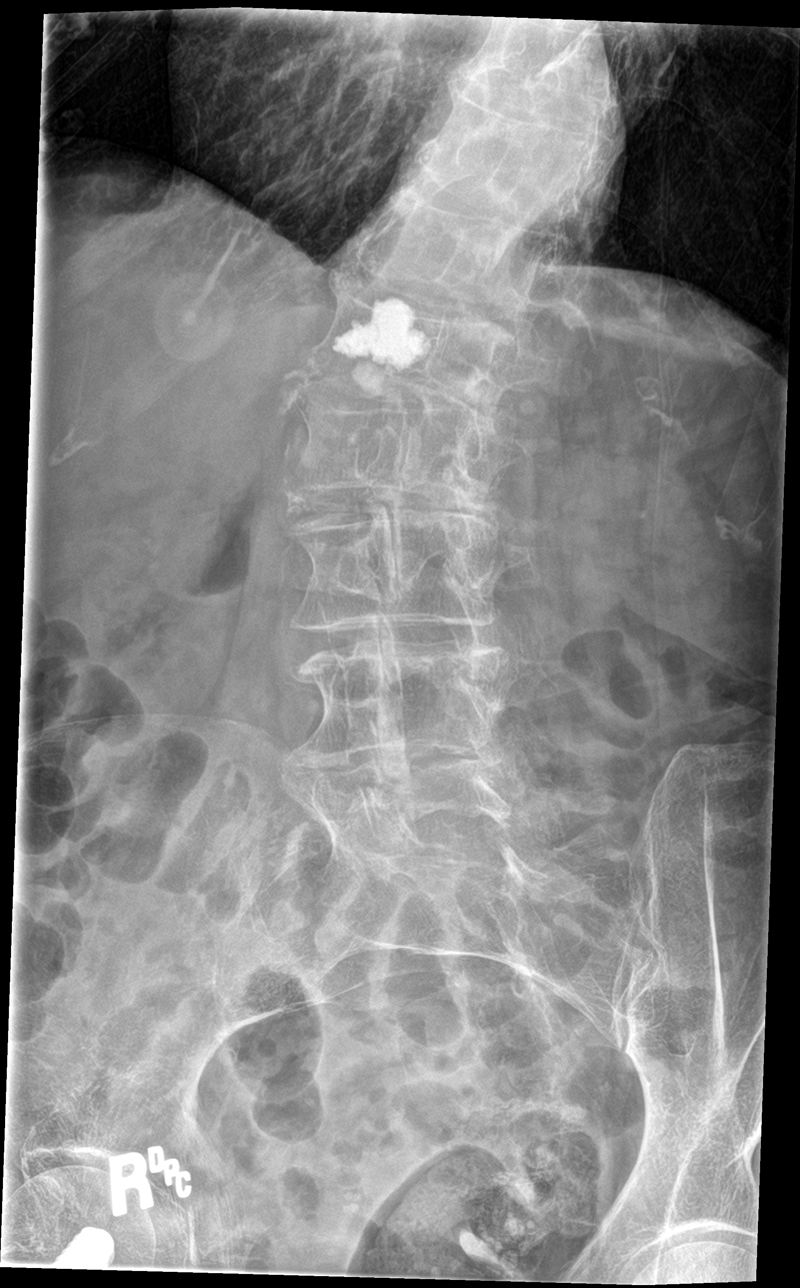

[l-spine obl (2 of 2)]
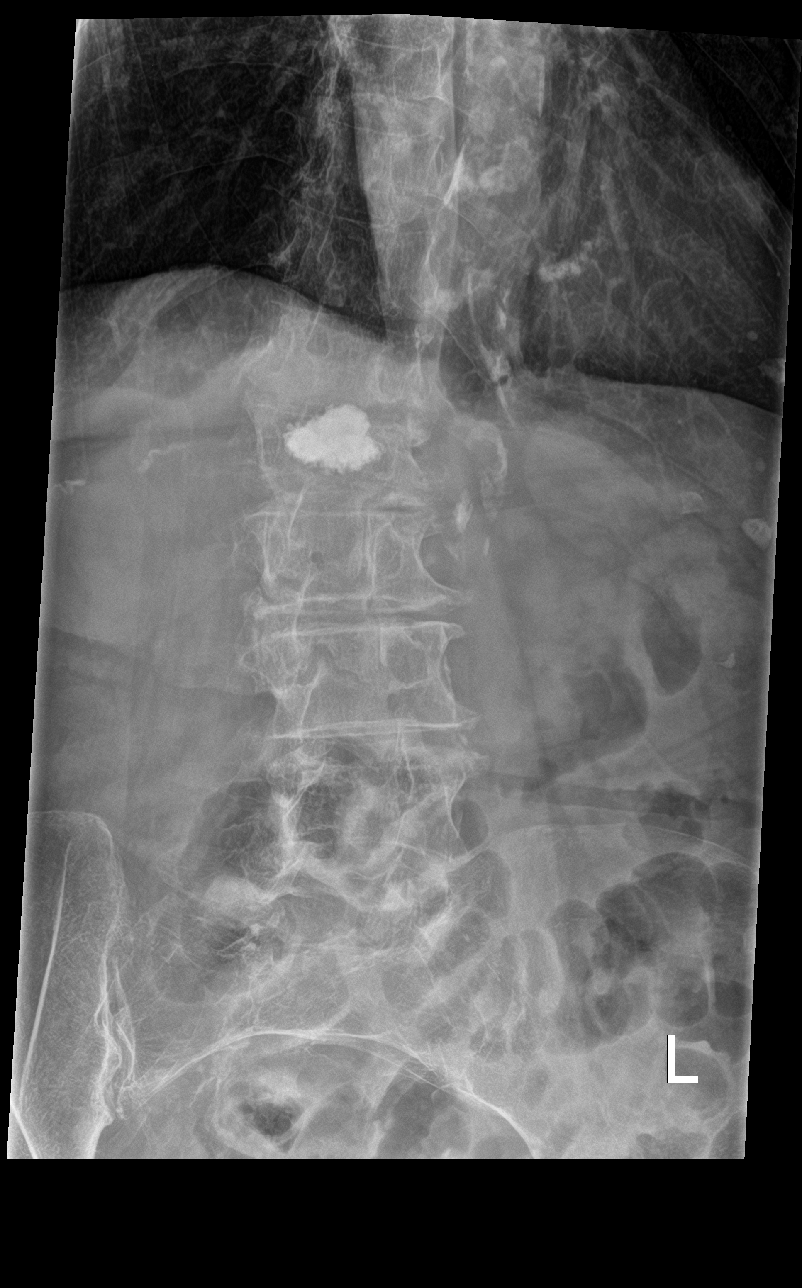

[l-spine lat]
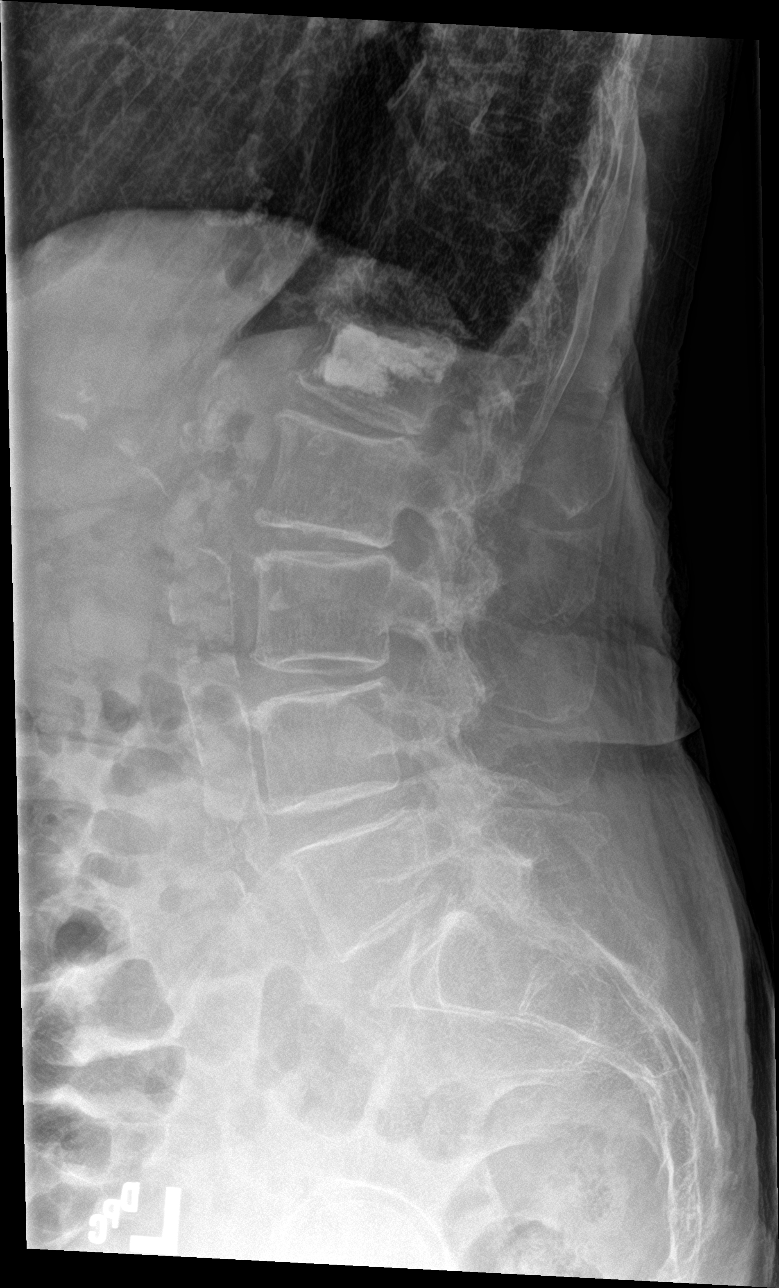

[l-spine spot]
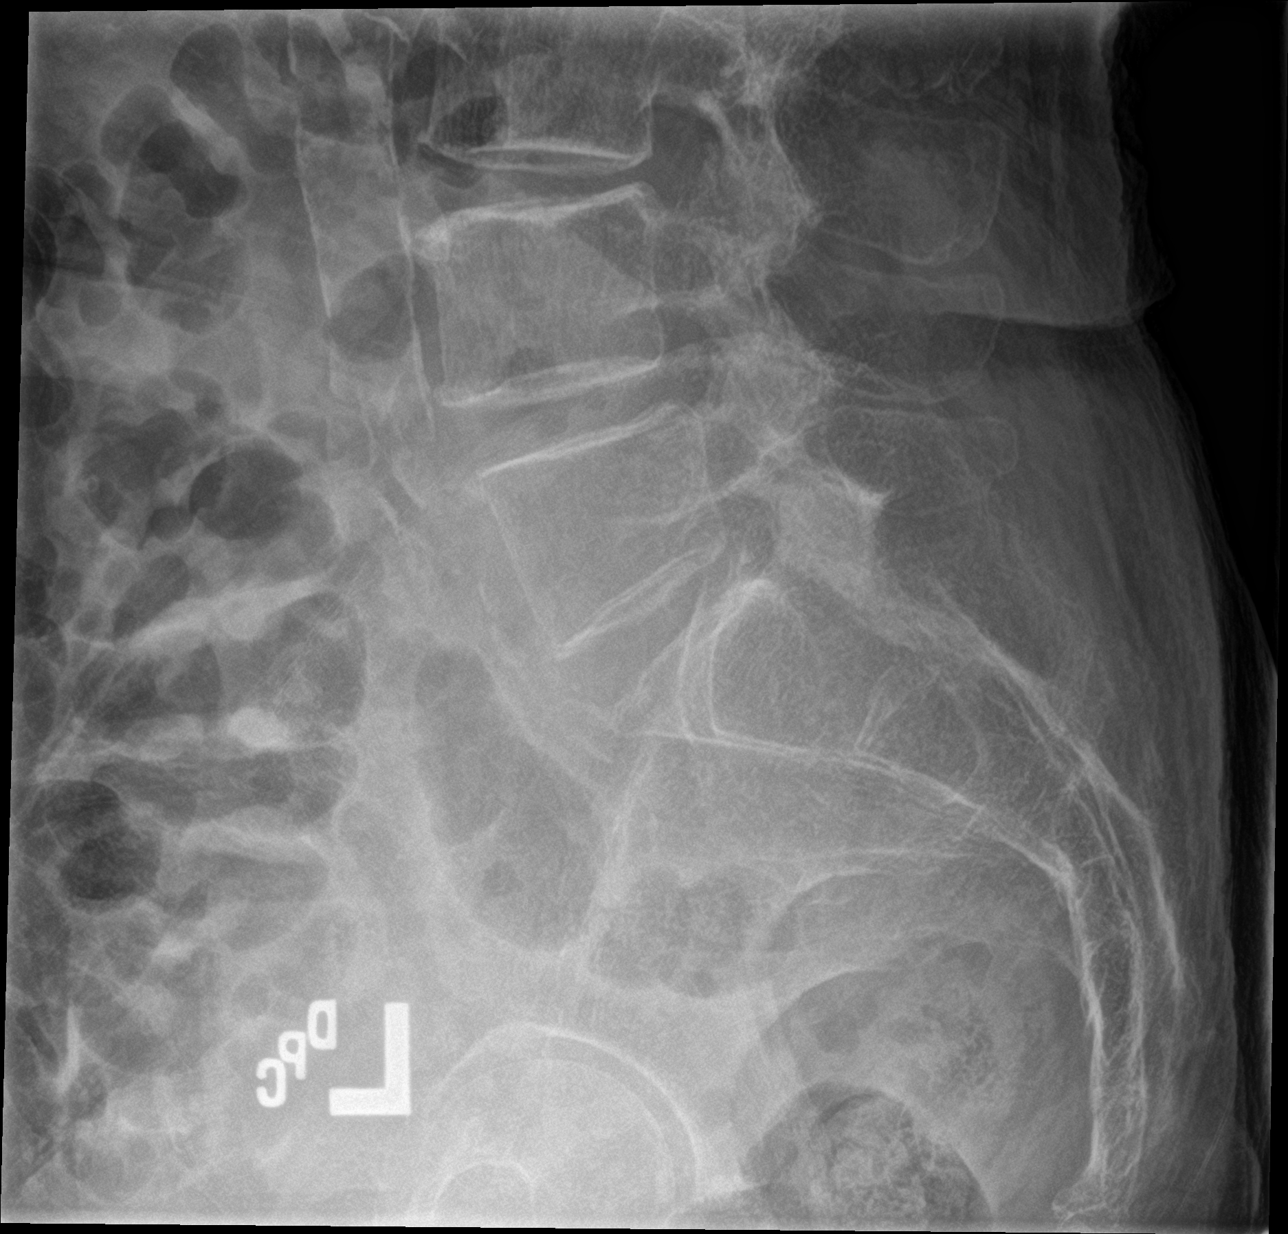

[5 of 5 positions shown; findings below may reference images not displayed]

FINDINGS: Scoliosis of the spine. Treated compression deformity at L1.
Remaining vertebral body heights are maintained. Mild degenerative
changes at L1-L2, L2-L3 and L3-L4. Aortic vascular calcification.
Calcified phleboliths in the pelvis.
IMPRESSION: 1. No acute osseous abnormality.
2. Scoliosis with treated compression deformity at L1.

## 2018-04-18 ENCOUNTER — Ambulatory Visit: Payer: Medicare Other | Admitting: Gastroenterology

## 2018-04-22 ENCOUNTER — Ambulatory Visit: Payer: Medicare Other | Admitting: Gastroenterology

## 2018-04-22 ENCOUNTER — Encounter

## 2018-06-03 ENCOUNTER — Ambulatory Visit: Payer: Medicare Other | Admitting: Gastroenterology

## 2018-08-06 ENCOUNTER — Telehealth: Payer: Self-pay | Admitting: Gastroenterology

## 2018-08-06 ENCOUNTER — Other Ambulatory Visit: Payer: Self-pay

## 2018-08-06 ENCOUNTER — Ambulatory Visit (INDEPENDENT_AMBULATORY_CARE_PROVIDER_SITE_OTHER): Payer: Medicare Other | Admitting: Gastroenterology

## 2018-08-06 DIAGNOSIS — K5900 Constipation, unspecified: Secondary | ICD-10-CM

## 2018-08-06 NOTE — Telephone Encounter (Signed)
noted 

## 2018-08-06 NOTE — Telephone Encounter (Signed)
Patient was unable to be reached today for visit. Can we check on her in next few days to see how she is doing and have a telephone visit arranged if needed?

## 2018-08-06 NOTE — Telephone Encounter (Signed)
Nurse from [facility] called back.  She stated that she would feel more comfortable with me discussing patient's care with her regular nurse, Morrie Sheldon.  She said that she was just a fill in.  She advised me to call back tomorrow.

## 2018-08-06 NOTE — Progress Notes (Deleted)
Referring Provider:  Primary Care Physician:  Bernerd LimboAriza, Fernando Enrique, MD  Primary GI:   Virtual Visit via Telephone Note Due to COVID-19, visit is conducted virtually and was requested by patient.   I connected with Alexandra Riley on 08/06/18 at  2:00 PM EDT by telephone and verified that I am speaking with the correct person using two identifiers.   I discussed the limitations, risks, security and privacy concerns of performing an evaluation and management service by telephone and the availability of in person appointments. I also discussed with the patient that there may be a patient responsible charge related to this service. The patient expressed understanding and agreed to proceed.  No chief complaint on file.    History of Present Illness: Alexandra Riley is a 85 year old female with history of constipation, chronic abdominal pain. 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 She saw Dr. Coral ElseVance Brabham and felt unlikely to have chronic mesenteric ischemia but did feel median arcuate ligament syndrome could be playing a role. Options include angiography to determine if the ligament was compressing the celiac artery, as it would not be a good lesion to stent. However, if stenosis noted amenable to stenting, could pursue this to help alleviate symptoms. She did not want to pursue this. Most recent CT March 2019 with equivocal wall thickening at splenic flexure, query secondary to diarrheal process or mild colitis. Heme positive in ED March 2019 but normal CBC at 12.9.Declining endoscopic evaluation.   Amitiza 8 mcg BID started in May 2019. June 2019 felt like she was straining. June 2019, Amitiza was increased to 24 mcg BID. This was decreased to once daily in Aug 2019 due to diarrhea.   Past Medical History:  Diagnosis Date  . Anorexia   . Anxiety   . Chronic pain syndrome   .  Constipation   . Dementia   . Failure to thrive in adult   . GERD (gastroesophageal reflux disease)   . Glaucoma   . Hemorrhoids   . High cholesterol   . Hyperlipidemia   . Major depressive disorder   . Neuropathy   . Osteoporosis   . Shortness of breath on exertion      Past Surgical History:  Procedure Laterality Date  . CATARACT EXTRACTION W/PHACO Right 01/20/2013   Procedure: CATARACT EXTRACTION PHACO AND INTRAOCULAR LENS PLACEMENT (IOC);  Surgeon: Gemma PayorKerry Hunt, MD;  Location: [facility];  Service: Ophthalmology;  Laterality: Right;  CDE:  18.27  . CESAREAN SECTION    . EYE SURGERY    . IR KYPHO LUMBAR INC FX REDUCE BONE BX UNI/BIL CANNULATION INC/IMAGING  11/24/2016  . ORIF HIP FRACTURE Right 05/28/2014   Procedure: OPEN REDUCTION INTERNAL FIXATION HIP;  Surgeon: Darreld McleanWayne Keeling, MD;  Location: [facility];  Service: Orthopedics;  Laterality: Right;  . TONSILLECTOMY       No outpatient medications have been marked as taking for the 08/06/18 encounter (Appointment) with Gelene MinkBoone, Makalah Asberry W, NP.       Observations/Objective: No distress. Unable to perform physical exam due to telephone encounter. No video available.   Assessment and Plan:   Follow Up Instructions:    I discussed the assessment and treatment plan with the patient. The patient was provided an opportunity to ask questions and all were answered. The patient agreed with the plan and demonstrated an understanding of the instructions.   The patient was advised to call  back or seek an in-person evaluation if the symptoms worsen or if the condition fails to improve as anticipated.  I provided *** minutes of non-face-to-face time during this encounter.  Gelene Mink, PhD, ANP-BC [facility]

## 2018-08-07 NOTE — Telephone Encounter (Signed)
Pt is still complaining of upset stomach every day (per nurse, Morrie Sheldon at [facility]).  Pt is re-scheduled for a telephone visit on 08/14/2018 at 10:00.  Morrie Sheldon at [facility] will be assisting [place] with the telephone call.

## 2018-08-07 NOTE — Telephone Encounter (Signed)
Noted  

## 2018-08-12 ENCOUNTER — Ambulatory Visit: Payer: Medicare Other | Admitting: Nurse Practitioner

## 2018-08-12 NOTE — Progress Notes (Signed)
Unable to reach patient for appt. Rescheduled.

## 2018-08-12 NOTE — Telephone Encounter (Signed)
Noted  

## 2018-08-14 ENCOUNTER — Ambulatory Visit (INDEPENDENT_AMBULATORY_CARE_PROVIDER_SITE_OTHER): Payer: Medicare Other | Admitting: Gastroenterology

## 2018-08-14 ENCOUNTER — Telehealth: Payer: Self-pay

## 2018-08-14 ENCOUNTER — Other Ambulatory Visit: Payer: Self-pay

## 2018-08-14 DIAGNOSIS — K5900 Constipation, unspecified: Secondary | ICD-10-CM

## 2018-08-14 NOTE — Progress Notes (Signed)
Unable to reach patient. We are requesting the facility to call us at time of appt instead of trying to reach. This is second appt attempt.

## 2018-08-14 NOTE — Telephone Encounter (Signed)
Made her a no show

## 2018-08-14 NOTE — Telephone Encounter (Signed)
Called [facility] x 2 for telephone visit w/AB. Call transferred to [address], no answer x 2 after many rings. Please no show for appt.

## 2018-08-19 ENCOUNTER — Encounter: Payer: Self-pay | Admitting: Gastroenterology

## 2018-08-19 ENCOUNTER — Ambulatory Visit (INDEPENDENT_AMBULATORY_CARE_PROVIDER_SITE_OTHER): Payer: Medicare Other | Admitting: Gastroenterology

## 2018-08-19 ENCOUNTER — Other Ambulatory Visit: Payer: Self-pay

## 2018-08-19 DIAGNOSIS — R109 Unspecified abdominal pain: Secondary | ICD-10-CM

## 2018-08-19 DIAGNOSIS — K5900 Constipation, unspecified: Secondary | ICD-10-CM | POA: Diagnosis not present

## 2018-08-19 DIAGNOSIS — R1013 Epigastric pain: Secondary | ICD-10-CM | POA: Insufficient documentation

## 2018-08-19 NOTE — Progress Notes (Signed)
Primary Care Physician:  Bernerd Limbo, MD Primary GI:  Roetta Sessions, MD    Patient Location: Home  Provider Location: [facility] office  Reason for Phone Visit: Abdominal pain, constipation  Persons present on the phone encounter, with roles: Patient, Alexandra Riley patient's nurse at [facility], myself (provider), Sandria Senter, LPN, (updated meds and allergies)  Total time (minutes) spent on medical discussion: 15 minutes  Due to COVID-19, visit was conducted using telephonic method (no video was available).  Visit was requested by patient.  Virtual Visit via Telephone only  I connected with Alexandra Riley, patient's nurse at [facility], on 08/19/18 at 10:30 AM EDT by telephone and verified that I am speaking with the correct person using two identifiers.   I discussed the limitations, risks, security and privacy concerns of performing an evaluation and management service by telephone and the availability of in person appointments. I also discussed with the patient that there may be a patient responsible charge related to this service. The patient expressed understanding and agreed to proceed.   HPI:   Patient is a pleasant 85 year old female who presents for telephone visit regarding chronic abdominal pain and constipation.  Last seen in August 2019.  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  She saw Dr. Coral Else and felt unlikely to have chronic mesenteric ischemia but did feel median arcuate ligament syndrome could be playing a role.Options include angiography to determine if the ligament was compressing the celiac artery, as it would not be a good lesion to stent. However, if stenosis noted amenable to stenting, could pursue this to help alleviate symptoms. She did not want to pursue this. Most recent CT March 2019 with equivocal wall thickening at  splenic flexure, query secondary to diarrheal process or mild colitis. Heme positive in ED March 2019 but normal CBC at 12.9.Declining endoscopic evaluation.   I spoke to patient's nurse, Alexandra Riley, who is with her 5 days/week.  She did not feel like patient was able to give adequate history herself.  She suffers from hallucinations.  Patient tends to do well in the morning, tolerates breakfast.  Generally always eats a really good breakfast.  As the lunchtime tray comes almost every day she starts complaining of "stomach feeling upset".  No vomiting.  Patient does appear to have some grimacing when she reports this.  Most of the time patient stomach is soft, occasional distention.  She has a bowel movement every day to every other day.  Stool is soft.  No melena or rectal bleeding.  Generally picks at her lunch and evening meal.  Fortunately she has been able to maintain her weight, weighing in at 117 pounds in Riley, 120 pounds in March.  Patient tends to refuse ambulation.  She is able to stand and pivot but does not show any desire to walk.  Patient's medical healthcare power of attorney is [place],  [phone].  Patient takes Pepcid in the morning and at bedtime.  She used to be on omeprazole but this was switched out sometime ago.  Her Amitiza is given at bedtime.  Patient complains of stomach feeling upset she is given Mylanta which seems to help.   Current Outpatient Medications  Medication Sig Dispense Refill   Alum & Mag Hydroxide-Simeth (MYLANTA PO) Take by mouth as needed.     Cholecalciferol (VITAMIN D3) 2000 units TABS Take  1 tablet by mouth daily.     clonazePAM (KLONOPIN) 0.5 MG tablet Take 1 tablet (0.5 mg total) by mouth at bedtime. Take 1 tablet by mouth once a day for anxiety for 14 days until 12/07/2016 (Patient taking differently: Take 0.25 mg by mouth 3 (three) times daily. ) 10 tablet 0   denosumab (PROLIA) 60 MG/ML SOSY injection Inject 60 mg into the skin  every 6 (six) months.     famotidine (PEPCID) 20 MG tablet Take 20 mg by mouth 2 (two) times daily.     ferrous sulfate 325 (65 FE) MG tablet Take 325 mg by mouth daily with breakfast.     gabapentin (NEURONTIN) 100 MG capsule Take 200 mg by mouth at bedtime.     HYDROcodone-acetaminophen (NORCO/VICODIN) 5-325 MG tablet Take 1 tablet by mouth every 6 (six) hours as needed for moderate pain (Must last 30 days.Do not take and drive a car or use machinery.). (Patient taking differently: Take 1 tablet by mouth 3 (three) times daily. ) 30 tablet 0   hydrocortisone (ANUSOL-HC) 25 MG suppository Place 25 mg rectally at bedtime.     Hypromellose (ARTIFICIAL TEARS OP) Apply 1 drop to eye every 12 (twelve) hours as needed.     lubiprostone (AMITIZA) 24 MCG capsule Take 24 mcg by mouth daily.      megestrol (MEGACE) 40 MG/ML suspension Take 40 mg by mouth 2 (two) times daily.     Menthol, Topical Analgesic, 5 % GEL Apply topically as needed.     ondansetron (ZOFRAN) 4 MG tablet Take 4 mg by mouth every 8 (eight) hours as needed for nausea or vomiting.      polyethylene glycol (MIRALAX / GLYCOLAX) packet Take 17 g by mouth daily. (Patient taking differently: Take 17 g by mouth 2 (two) times daily. ) 14 each 0   QUEtiapine (SEROQUEL) 25 MG tablet Take 25 mg by mouth at bedtime.      senna (SENOKOT) 8.6 MG TABS tablet Take 1 tablet by mouth 2 (two) times daily.     sertraline (ZOLOFT) 25 MG tablet Take 100 mg by mouth daily.      Witch Hazel (PREPARATION H EX) Apply topically every 6 (six) hours as needed.     No current facility-administered medications for this visit.     Past Medical History:  Diagnosis Date   Anorexia    Anxiety    Chronic pain syndrome    Constipation    Dementia    Failure to thrive in adult    GERD (gastroesophageal reflux disease)    Glaucoma    Hemorrhoids    High cholesterol    Hyperlipidemia    Major depressive disorder    Neuropathy     Osteoporosis    Shortness of breath on exertion     Past Surgical History:  Procedure Laterality Date   CATARACT EXTRACTION W/PHACO Right 01/20/2013   Procedure: CATARACT EXTRACTION PHACO AND INTRAOCULAR LENS PLACEMENT (IOC);  Surgeon: Gemma PayorKerry Hunt, MD;  Location: [facility];  Service: Ophthalmology;  Laterality: Right;  CDE:  18.27   CESAREAN SECTION     EYE SURGERY     IR KYPHO LUMBAR INC FX REDUCE BONE BX UNI/BIL CANNULATION INC/IMAGING  11/24/2016   ORIF HIP FRACTURE Right 05/28/2014   Procedure: OPEN REDUCTION INTERNAL FIXATION HIP;  Surgeon: Darreld McleanWayne Keeling, MD;  Location: [facility];  Service: Orthopedics;  Laterality: Right;   TONSILLECTOMY      Family History  Problem Relation Age of  Onset   Colon cancer Neg Hx     Social History   Socioeconomic History   Marital status: Widowed    Spouse name: Not on file   Number of children: Not on file   Years of education: Not on file   Highest education level: Not on file  Occupational History   Not on file  Social Needs   Financial resource strain: Not on file   Food insecurity:    Worry: Not on file    Inability: Not on file   Transportation needs:    Medical: Not on file    Non-medical: Not on file  Tobacco Use   Smoking status: Never Smoker   Smokeless tobacco: Current User    Types: Snuff  Substance and Sexual Activity   Alcohol use: No   Drug use: No   Sexual activity: Never  Lifestyle   Physical activity:    Days per week: Not on file    Minutes per session: Not on file   Stress: Not on file  Relationships   Social connections:    Talks on phone: Not on file    Gets together: Not on file    Attends religious service: Not on file    Active member of club or organization: Not on file    Attends meetings of clubs or organizations: Not on file    Relationship status: Not on file   Intimate partner violence:    Fear of current or ex partner: Not on file    Emotionally abused: Not on file     Physically abused: Not on file    Forced sexual activity: Not on file  Other Topics Concern   Not on file  Social History Narrative   Not on file      ROS: Provided by nursing staff.  No weight loss.  Does not appear to have any difficulty swallowing.  No shortness of breath or dyspnea on exertion.  No cough.     Observations/Objective: Exam unavailable.  Assessment and Plan: 85 year old female with history of constipation, chronic abdominal pain who continues to have daily complaints of stomach feeling upset.  There is been no diarrhea.  No vomiting.  Sometimes abdomen is distended but often soft.  She has a history of concern for median arcuate ligament syndrome, previously declined angiography to determine if the ligament was compressing the celiac artery as well as see if there was a good lesion to stent.  Nursing staff reports that patient symptoms that seem to settle down with use of Mylanta.  At some point between her last visit here in August and now her omeprazole was switched out to Pepcid.  Symptoms could be related to poorly controlled reflux or gastritis.  Initially we will resume PPI, plan for pantoprazole 40 mg 30 minutes before breakfast.  Continue Pepcid at bedtime.  Nursing staff advised to call with any ongoing complaints of abdominal pain or any concerns.  Follow Up Instructions:    I discussed the assessment and treatment plan with the patient. The patient was provided an opportunity to ask questions and all were answered. The patient agreed with the plan and demonstrated an understanding of the instructions. AVS mailed to patient's home address.   The patient was advised to call back or seek an in-person evaluation if the symptoms worsen or if the condition fails to improve as anticipated.  I provided 15 minutes of non-face-to-face time during this encounter.   Tana Coast, PA-C

## 2018-08-19 NOTE — Patient Instructions (Signed)
1. Change pepcid to 20mg  at bedtime ONLY.  2. Start pantoprazole 40mg  once daily before breakfast.  3. Call if ongoing concerns for abdominal pain.

## 2018-08-20 ENCOUNTER — Ambulatory Visit: Payer: Medicare Other | Admitting: Gastroenterology

## 2018-08-20 NOTE — Progress Notes (Signed)
cc'd to pcp 

## 2019-01-30 DEATH — deceased
